# Patient Record
Sex: Female | Born: 1950 | Race: White | Hispanic: No | Smoking: Never smoker
Health system: Southern US, Community
[De-identification: ages and names within clinical notes are randomized; demographics above are authoritative.]

## PROBLEM LIST (undated history)

## (undated) ENCOUNTER — Inpatient Hospital Stay: Admission: EM | Payer: Self-pay | Source: Home / Self Care

## (undated) DIAGNOSIS — E669 Obesity, unspecified: Secondary | ICD-10-CM

## (undated) DIAGNOSIS — E785 Hyperlipidemia, unspecified: Secondary | ICD-10-CM

## (undated) DIAGNOSIS — E1169 Type 2 diabetes mellitus with other specified complication: Secondary | ICD-10-CM

## (undated) DIAGNOSIS — I213 ST elevation (STEMI) myocardial infarction of unspecified site: Secondary | ICD-10-CM

## (undated) DIAGNOSIS — I10 Essential (primary) hypertension: Secondary | ICD-10-CM

## (undated) DIAGNOSIS — D126 Benign neoplasm of colon, unspecified: Secondary | ICD-10-CM

## (undated) DIAGNOSIS — I2510 Atherosclerotic heart disease of native coronary artery without angina pectoris: Secondary | ICD-10-CM

## (undated) DIAGNOSIS — K219 Gastro-esophageal reflux disease without esophagitis: Secondary | ICD-10-CM

## (undated) DIAGNOSIS — Z9861 Coronary angioplasty status: Secondary | ICD-10-CM

## (undated) DIAGNOSIS — E559 Vitamin D deficiency, unspecified: Secondary | ICD-10-CM

## (undated) DIAGNOSIS — E1159 Type 2 diabetes mellitus with other circulatory complications: Secondary | ICD-10-CM

## (undated) HISTORY — DX: Atherosclerotic heart disease of native coronary artery without angina pectoris: I25.10

## (undated) HISTORY — DX: Type 2 diabetes mellitus with other specified complication: E78.5

## (undated) HISTORY — PX: ABDOMINAL HYSTERECTOMY: SHX81

## (undated) HISTORY — DX: Vitamin D deficiency, unspecified: E55.9

## (undated) HISTORY — DX: Coronary angioplasty status: Z98.61

## (undated) HISTORY — DX: Benign neoplasm of colon, unspecified: D12.6

## (undated) HISTORY — PX: NM MYOVIEW LTD: HXRAD82

## (undated) HISTORY — DX: Type 2 diabetes mellitus with other specified complication: E11.69

## (undated) HISTORY — DX: Essential (primary) hypertension: I10

---
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
## 1994-02-07 HISTORY — PX: LAPAROSCOPIC HYSTERECTOMY: SHX1926

## 1997-08-04 ENCOUNTER — Other Ambulatory Visit: Admission: RE | Admit: 1997-08-04 | Discharge: 1997-08-04 | Payer: Self-pay | Admitting: Obstetrics and Gynecology

## 2000-06-19 ENCOUNTER — Other Ambulatory Visit: Admission: RE | Admit: 2000-06-19 | Discharge: 2000-06-19 | Payer: Self-pay | Admitting: Obstetrics and Gynecology

## 2003-06-13 ENCOUNTER — Encounter: Admission: RE | Admit: 2003-06-13 | Discharge: 2003-06-17 | Payer: Self-pay | Admitting: Family Medicine

## 2003-06-20 ENCOUNTER — Encounter: Admission: RE | Admit: 2003-06-20 | Discharge: 2003-07-11 | Payer: Self-pay | Admitting: Family Medicine

## 2003-12-30 ENCOUNTER — Encounter: Admission: RE | Admit: 2003-12-30 | Discharge: 2003-12-30 | Payer: Self-pay | Admitting: Family Medicine

## 2004-02-08 HISTORY — PX: LAPAROSCOPIC GASTRIC BANDING: SHX1100

## 2004-05-18 ENCOUNTER — Encounter: Admission: RE | Admit: 2004-05-18 | Discharge: 2004-08-16 | Payer: Self-pay | Admitting: Family Medicine

## 2004-05-27 ENCOUNTER — Ambulatory Visit (HOSPITAL_COMMUNITY): Admission: RE | Admit: 2004-05-27 | Discharge: 2004-05-27 | Payer: Self-pay | Admitting: Surgery

## 2004-08-17 ENCOUNTER — Encounter: Admission: RE | Admit: 2004-08-17 | Discharge: 2004-11-15 | Payer: Self-pay | Admitting: Family Medicine

## 2004-08-31 ENCOUNTER — Inpatient Hospital Stay (HOSPITAL_COMMUNITY): Admission: RE | Admit: 2004-08-31 | Discharge: 2004-09-06 | Payer: Self-pay | Admitting: Surgery

## 2005-01-03 ENCOUNTER — Encounter: Admission: RE | Admit: 2005-01-03 | Discharge: 2005-01-03 | Payer: Self-pay | Admitting: Family Medicine

## 2005-02-25 ENCOUNTER — Encounter: Admission: RE | Admit: 2005-02-25 | Discharge: 2005-05-26 | Payer: Self-pay | Admitting: Family Medicine

## 2005-06-30 ENCOUNTER — Encounter: Admission: RE | Admit: 2005-06-30 | Discharge: 2005-06-30 | Payer: Self-pay | Admitting: Family Medicine

## 2005-08-30 ENCOUNTER — Ambulatory Visit: Payer: Self-pay | Admitting: Gastroenterology

## 2005-09-07 DIAGNOSIS — D126 Benign neoplasm of colon, unspecified: Secondary | ICD-10-CM

## 2005-09-07 HISTORY — DX: Benign neoplasm of colon, unspecified: D12.6

## 2005-10-06 ENCOUNTER — Ambulatory Visit: Payer: Self-pay | Admitting: Gastroenterology

## 2010-09-24 ENCOUNTER — Encounter: Payer: Self-pay | Admitting: Gastroenterology

## 2010-10-07 ENCOUNTER — Encounter: Payer: Self-pay | Admitting: Gastroenterology

## 2010-11-11 ENCOUNTER — Ambulatory Visit (AMBULATORY_SURGERY_CENTER): Payer: 59 | Admitting: *Deleted

## 2010-11-11 ENCOUNTER — Encounter: Payer: Self-pay | Admitting: Gastroenterology

## 2010-11-11 VITALS — Ht 61.0 in | Wt 235.0 lb

## 2010-11-11 DIAGNOSIS — Z1211 Encounter for screening for malignant neoplasm of colon: Secondary | ICD-10-CM

## 2010-11-11 MED ORDER — PEG-KCL-NACL-NASULF-NA ASC-C 100 G PO SOLR: ORAL | Status: DC

## 2010-11-26 ENCOUNTER — Encounter: Payer: Self-pay | Admitting: Gastroenterology

## 2010-11-26 ENCOUNTER — Ambulatory Visit (AMBULATORY_SURGERY_CENTER): Payer: 59 | Admitting: Gastroenterology

## 2010-11-26 VITALS — BP 184/111 | HR 91 | Temp 98.3°F | Resp 21 | Ht 61.0 in | Wt 235.0 lb

## 2010-11-26 DIAGNOSIS — Z1211 Encounter for screening for malignant neoplasm of colon: Secondary | ICD-10-CM

## 2010-11-26 DIAGNOSIS — Z86010 Personal history of colonic polyps: Secondary | ICD-10-CM

## 2010-11-26 MED ORDER — SODIUM CHLORIDE 0.9 % IV SOLN: 500.0000 mL | INTRAVENOUS | Status: DC

## 2010-11-26 NOTE — Progress Notes (Signed)
Pt was uncomfortable with the scope being advanced.  Medications were titrated per Dr. Ardell Isaacs orders.  Pt did relax and went to sleep while the scope was withdrawn from the cecum.  Pt rested comfortably with her eyes closed.  maw

## 2010-11-26 NOTE — Patient Instructions (Signed)
Normal colon with internal hemorrhoids  Repeat colonoscopy in 5 years.  See green and blue sheets for additional d/c instructions.

## 2010-11-29 ENCOUNTER — Telehealth: Payer: Self-pay | Admitting: *Deleted

## 2010-11-29 NOTE — Telephone Encounter (Signed)

## 2011-12-29 ENCOUNTER — Encounter (INDEPENDENT_AMBULATORY_CARE_PROVIDER_SITE_OTHER): Payer: Self-pay | Admitting: General Surgery

## 2011-12-30 ENCOUNTER — Encounter (INDEPENDENT_AMBULATORY_CARE_PROVIDER_SITE_OTHER): Payer: Self-pay | Admitting: Surgery

## 2011-12-30 ENCOUNTER — Ambulatory Visit (INDEPENDENT_AMBULATORY_CARE_PROVIDER_SITE_OTHER): Payer: 59 | Admitting: Surgery

## 2011-12-30 VITALS — BP 194/110 | HR 80 | Temp 98.8°F | Resp 16 | Ht 62.0 in | Wt 236.4 lb

## 2011-12-30 DIAGNOSIS — Z9884 Bariatric surgery status: Secondary | ICD-10-CM

## 2011-12-30 NOTE — Progress Notes (Signed)
Sandra Beasley 61 y.o.  Body mass index is 43.24 kg/(m^2).  There is no problem list on file for this patient.   No Known Allergies  Past Surgical History  Procedure Date  . Tonsillectomy 1966  . Laparoscopic hysterectomy 1996  . Laparoscopic gastric banding 2006   Sandra Floro, MD No diagnosis found.  I have not seen the solution in a number of years. She is keeping her weight off is still around 236 which gives her about a 70 pound weight loss. She states that when she drinks one few times a year she will have burning pain throughout her chest. She thinks this may be from taking metformin lighted night and going to bed. She may need liquid metformin. She was worried about reflux and one to have endoscopy described spoken with Dr. Ezzard Standing about performing an upper endoscopy on her. I can see her back after the endoscopy and we need to get her on liquid metformin would likely speak with Dr. Duane Lope. Sandra B. Daphine Deutscher, MD, [facility] [phone] beeper [phone]  12/30/2011 4:50 PM

## 2012-01-17 ENCOUNTER — Encounter (HOSPITAL_COMMUNITY): Payer: Self-pay

## 2012-01-17 ENCOUNTER — Ambulatory Visit (HOSPITAL_COMMUNITY): Admit: 2012-01-17 | Payer: Self-pay | Admitting: Surgery

## 2012-01-17 SURGERY — EGD (ESOPHAGOGASTRODUODENOSCOPY)
Anesthesia: Moderate Sedation

## 2012-01-24 ENCOUNTER — Ambulatory Visit (HOSPITAL_COMMUNITY): Admission: RE | Admit: 2012-01-24 | Payer: 59 | Source: Ambulatory Visit | Admitting: Surgery

## 2012-01-24 ENCOUNTER — Encounter (HOSPITAL_COMMUNITY): Admission: RE | Payer: Self-pay | Source: Ambulatory Visit

## 2012-01-24 SURGERY — EGD (ESOPHAGOGASTRODUODENOSCOPY)
Anesthesia: Moderate Sedation

## 2012-02-17 ENCOUNTER — Ambulatory Visit (INDEPENDENT_AMBULATORY_CARE_PROVIDER_SITE_OTHER): Payer: 59 | Admitting: Surgery

## 2013-01-02 ENCOUNTER — Encounter (HOSPITAL_COMMUNITY): Payer: Self-pay | Admitting: Cardiology

## 2013-01-02 ENCOUNTER — Inpatient Hospital Stay (HOSPITAL_COMMUNITY)
Admission: EM | Admit: 2013-01-02 | Discharge: 2013-01-05 | DRG: 247 | Disposition: A | Discharge status: Home / Self Care | Payer: 59 | Attending: Cardiology | Admitting: Cardiology

## 2013-01-02 ENCOUNTER — Encounter (HOSPITAL_COMMUNITY)
Admission: EM | Disposition: A | Discharge status: Home / Self Care | Payer: Self-pay | Source: Home / Self Care | Attending: Cardiology

## 2013-01-02 ENCOUNTER — Ambulatory Visit (HOSPITAL_COMMUNITY): Admit: 2013-01-02 | Payer: Self-pay | Admitting: Cardiology

## 2013-01-02 DIAGNOSIS — E785 Hyperlipidemia, unspecified: Secondary | ICD-10-CM

## 2013-01-02 DIAGNOSIS — I10 Essential (primary) hypertension: Secondary | ICD-10-CM

## 2013-01-02 DIAGNOSIS — Z9884 Bariatric surgery status: Secondary | ICD-10-CM

## 2013-01-02 DIAGNOSIS — I2510 Atherosclerotic heart disease of native coronary artery without angina pectoris: Secondary | ICD-10-CM | POA: Diagnosis present

## 2013-01-02 DIAGNOSIS — E1159 Type 2 diabetes mellitus with other circulatory complications: Secondary | ICD-10-CM

## 2013-01-02 DIAGNOSIS — I219 Acute myocardial infarction, unspecified: Secondary | ICD-10-CM

## 2013-01-02 DIAGNOSIS — E119 Type 2 diabetes mellitus without complications: Secondary | ICD-10-CM | POA: Diagnosis present

## 2013-01-02 DIAGNOSIS — I25118 Atherosclerotic heart disease of native coronary artery with other forms of angina pectoris: Secondary | ICD-10-CM | POA: Insufficient documentation

## 2013-01-02 DIAGNOSIS — Z6841 Body Mass Index (BMI) 40.0 and over, adult: Secondary | ICD-10-CM

## 2013-01-02 DIAGNOSIS — I2119 ST elevation (STEMI) myocardial infarction involving other coronary artery of inferior wall: Secondary | ICD-10-CM

## 2013-01-02 DIAGNOSIS — I213 ST elevation (STEMI) myocardial infarction of unspecified site: Secondary | ICD-10-CM

## 2013-01-02 DIAGNOSIS — E669 Obesity, unspecified: Secondary | ICD-10-CM

## 2013-01-02 HISTORY — PX: CARDIAC CATHETERIZATION: SHX172

## 2013-01-02 HISTORY — PX: LEFT HEART CATHETERIZATION WITH CORONARY ANGIOGRAM: SHX5451

## 2013-01-02 HISTORY — DX: Gastro-esophageal reflux disease without esophagitis: K21.9

## 2013-01-02 HISTORY — DX: Type 2 diabetes mellitus with other circulatory complications: E11.59

## 2013-01-02 HISTORY — DX: ST elevation (STEMI) myocardial infarction of unspecified site: I21.3

## 2013-01-02 HISTORY — DX: Obesity, unspecified: E66.9

## 2013-01-02 HISTORY — PX: CORONARY ANGIOPLASTY WITH STENT PLACEMENT: SHX49

## 2013-01-02 LAB — CBC
Hemoglobin: 12.9 g/dL (ref 12.0–15.0)
MCH: 28.5 pg (ref 26.0–34.0)
MCV: 84.7 fL (ref 78.0–100.0)
Platelets: 203 10*3/uL (ref 150–400)
RBC: 4.52 MIL/uL (ref 3.87–5.11)

## 2013-01-02 LAB — LIPID PANEL
Cholesterol: 227 mg/dL — ABNORMAL HIGH (ref 0–200)
LDL Cholesterol: 145 mg/dL — ABNORMAL HIGH (ref 0–99)
Total CHOL/HDL Ratio: 4 RATIO
Triglycerides: 123 mg/dL (ref <150)
VLDL: 25 mg/dL (ref 0–40)

## 2013-01-02 LAB — TROPONIN I
Troponin I: 0.4 ng/mL (ref <0.30)
Troponin I: 0.55 ng/mL (ref <0.30)

## 2013-01-02 LAB — APTT: aPTT: 97 seconds — ABNORMAL HIGH (ref 24–37)

## 2013-01-02 LAB — COMPREHENSIVE METABOLIC PANEL
ALT: 8 U/L (ref 0–35)
AST: 7 U/L (ref 0–37)
Alkaline Phosphatase: 68 U/L (ref 39–117)
CO2: 23 mEq/L (ref 19–32)
Calcium: 8.6 mg/dL (ref 8.4–10.5)
Creatinine, Ser: 0.73 mg/dL (ref 0.50–1.10)
GFR calc Af Amer: >90 mL/min (ref >90)
GFR calc non Af Amer: 88 mL/min — ABNORMAL LOW (ref >90)
Glucose, Bld: 227 mg/dL — ABNORMAL HIGH (ref 70–99)
Potassium: 3.7 mEq/L (ref 3.5–5.1)
Sodium: 133 mEq/L — ABNORMAL LOW (ref 135–145)
Total Protein: 6 g/dL (ref 6.0–8.3)

## 2013-01-02 LAB — GLUCOSE, CAPILLARY: Glucose-Capillary: 212 mg/dL — ABNORMAL HIGH (ref 70–99)

## 2013-01-02 LAB — POCT I-STAT, CHEM 8
HCT: 40 % (ref 36.0–46.0)
Hemoglobin: 13.6 g/dL (ref 12.0–15.0)
Potassium: 3.7 mEq/L (ref 3.5–5.1)
Sodium: 136 mEq/L (ref 135–145)
TCO2: 23 mmol/L (ref 0–100)

## 2013-01-02 LAB — CK TOTAL AND CKMB (NOT AT ARMC)
Relative Index: INVALID (ref 0.0–2.5)
Total CK: 31 U/L (ref 7–177)

## 2013-01-02 SURGERY — LEFT HEART CATHETERIZATION WITH CORONARY ANGIOGRAM
Anesthesia: LOCAL

## 2013-01-02 MED ORDER — ASPIRIN 81 MG PO CHEW
81.0000 mg | CHEWABLE_TABLET | Freq: Every day | ORAL | Status: DC
  Administered 2013-01-03 – 2013-01-05 (×3): 81 mg via ORAL
  Filled 2013-01-02 (×3): qty 1

## 2013-01-02 MED ORDER — HYDRALAZINE HCL 20 MG/ML IJ SOLN
20.0000 mg | INTRAMUSCULAR | Status: DC | PRN
  Administered 2013-01-02: 20 mg via INTRAVENOUS
  Filled 2013-01-02: qty 1

## 2013-01-02 MED ORDER — MIDAZOLAM HCL 2 MG/2ML IJ SOLN
INTRAMUSCULAR | Status: AC
  Filled 2013-01-02: qty 2

## 2013-01-02 MED ORDER — INSULIN ASPART 100 UNIT/ML ~~LOC~~ SOLN
0.0000 [IU] | Freq: Every day | SUBCUTANEOUS | Status: DC
  Administered 2013-01-02: 2 [IU] via SUBCUTANEOUS
  Filled 2013-01-02: qty 0.05

## 2013-01-02 MED ORDER — SODIUM CHLORIDE 0.9 % IV SOLN
1.0000 mL/kg/h | INTRAVENOUS | Status: AC
  Administered 2013-01-02: 1 mL/kg/h via INTRAVENOUS

## 2013-01-02 MED ORDER — TICAGRELOR 90 MG PO TABS
ORAL_TABLET | ORAL | Status: AC
  Filled 2013-01-02: qty 1

## 2013-01-02 MED ORDER — TICAGRELOR 90 MG PO TABS
90.0000 mg | ORAL_TABLET | Freq: Two times a day (BID) | ORAL | Status: DC
  Administered 2013-01-02 – 2013-01-05 (×6): 90 mg via ORAL
  Filled 2013-01-02 (×8): qty 1

## 2013-01-02 MED ORDER — ACETAMINOPHEN 325 MG PO TABS
650.0000 mg | ORAL_TABLET | ORAL | Status: DC | PRN
  Administered 2013-01-02 – 2013-01-03 (×2): 650 mg via ORAL
  Filled 2013-01-02 (×2): qty 2

## 2013-01-02 MED ORDER — FENTANYL CITRATE 0.05 MG/ML IJ SOLN
INTRAMUSCULAR | Status: AC
  Filled 2013-01-02: qty 2

## 2013-01-02 MED ORDER — SODIUM CHLORIDE 0.9 % IJ SOLN
3.0000 mL | Freq: Two times a day (BID) | INTRAMUSCULAR | Status: DC
  Administered 2013-01-03 – 2013-01-04 (×4): 3 mL via INTRAVENOUS

## 2013-01-02 MED ORDER — CARVEDILOL 6.25 MG PO TABS
6.2500 mg | ORAL_TABLET | Freq: Two times a day (BID) | ORAL | Status: DC
  Administered 2013-01-03 – 2013-01-04 (×3): 6.25 mg via ORAL
  Filled 2013-01-02 (×6): qty 1

## 2013-01-02 MED ORDER — SODIUM CHLORIDE 0.9 % IV SOLN: 250.0000 mL | INTRAVENOUS | Status: DC | PRN

## 2013-01-02 MED ORDER — BIVALIRUDIN 250 MG IV SOLR
INTRAVENOUS | Status: AC
  Filled 2013-01-02: qty 250

## 2013-01-02 MED ORDER — SODIUM CHLORIDE 0.9 % IV SOLN
0.2500 mg/kg/h | INTRAVENOUS | Status: AC
  Filled 2013-01-02: qty 250

## 2013-01-02 MED ORDER — SODIUM CHLORIDE 0.9 % IV SOLN
0.2500 mg/kg/h | INTRAVENOUS | Status: DC
  Administered 2013-01-02: 0.25 mg/kg/h via INTRAVENOUS
  Filled 2013-01-02: qty 250

## 2013-01-02 MED ORDER — ATORVASTATIN CALCIUM 80 MG PO TABS
80.0000 mg | ORAL_TABLET | Freq: Every day | ORAL | Status: DC
  Administered 2013-01-03 – 2013-01-04 (×2): 80 mg via ORAL
  Filled 2013-01-02 (×3): qty 1

## 2013-01-02 MED ORDER — HEPARIN (PORCINE) IN NACL 2-0.9 UNIT/ML-% IJ SOLN
INTRAMUSCULAR | Status: AC
Start: 2013-01-02 — End: 2013-01-02
  Filled 2013-01-02: qty 1500

## 2013-01-02 MED ORDER — NITROGLYCERIN 0.2 MG/ML ON CALL CATH LAB
INTRAVENOUS | Status: AC
  Filled 2013-01-02: qty 1

## 2013-01-02 MED ORDER — ONDANSETRON HCL 4 MG/2ML IJ SOLN: 4.0000 mg | Freq: Four times a day (QID) | INTRAMUSCULAR | Status: DC | PRN

## 2013-01-02 MED ORDER — RAMIPRIL 5 MG PO CAPS
5.0000 mg | ORAL_CAPSULE | Freq: Every day | ORAL | Status: DC
  Administered 2013-01-03: 5 mg via ORAL
  Filled 2013-01-02 (×2): qty 1

## 2013-01-02 MED ORDER — MORPHINE SULFATE 2 MG/ML IJ SOLN: 2.0000 mg | INTRAMUSCULAR | Status: DC | PRN

## 2013-01-02 MED ORDER — VERAPAMIL HCL 2.5 MG/ML IV SOLN
INTRAVENOUS | Status: AC
  Filled 2013-01-02: qty 2

## 2013-01-02 MED ORDER — INSULIN ASPART 100 UNIT/ML ~~LOC~~ SOLN
0.0000 [IU] | Freq: Three times a day (TID) | SUBCUTANEOUS | Status: DC
  Administered 2013-01-03 (×2): 3 [IU] via SUBCUTANEOUS
  Administered 2013-01-03 – 2013-01-04 (×2): 2 [IU] via SUBCUTANEOUS
  Administered 2013-01-04: 3 [IU] via SUBCUTANEOUS
  Administered 2013-01-04 – 2013-01-05 (×2): 2 [IU] via SUBCUTANEOUS
  Filled 2013-01-02: qty 0.15

## 2013-01-02 MED ORDER — SODIUM CHLORIDE 0.9 % IJ SOLN: 3.0000 mL | INTRAMUSCULAR | Status: DC | PRN

## 2013-01-02 MED ORDER — CARVEDILOL 3.125 MG PO TABS
3.1250 mg | ORAL_TABLET | Freq: Two times a day (BID) | ORAL | Status: DC
  Filled 2013-01-02: qty 1

## 2013-01-02 MED ORDER — LIDOCAINE HCL (PF) 1 % IJ SOLN
INTRAMUSCULAR | Status: AC
  Filled 2013-01-02: qty 30

## 2013-01-02 NOTE — Progress Notes (Signed)
CRITICAL VALUE ALERT  Critical value received:  INR=6.89  Date of notification:  01/02/13  Time of notification:  1950  Critical value read back:yes  Nurse who received alert:  Montine Circle  MD notified (1st page):  Dr. Herbie Baltimore  Time of first page:  1953  MD notified (2nd page):  Time of second page:  Responding MD:  Dr. Herbie Baltimore  Time MD responded:  [phone]

## 2013-01-02 NOTE — Progress Notes (Signed)
Patient ID: Sandra Beasley MRN: [id number], DOB/AGE: October 06, 1950   Admit date: 01/02/2013   Primary Physician:  Duane Lope, MD Primary Cardiologist: Dr Herbie Baltimore ([place])  HPI: 62 y/o with no prior history of CAD, admitted via EMS from home with a STEMI. History is per old records. She apparently has a history of NIDDM, HTN, dyslipidemia, and obesity. She has had prio lap banding. She developed SSCP today around 3:30 pm. She was alone at home. She didn't call EMS till she became diaphoretic around 5 pm. EKG on the sceen showed ST elevation in 2/3/F. She was taken emergently to the cath lab by Dr Herbie Baltimore.   Problem List: Past Medical History  Diagnosis Date  . Type 2 diabetes mellitus with circulatory disorder   . Hypertension   . Hyperlipidemia   . Obesity   . STEMI (ST elevation myocardial infarction) 01/02/13    Past Surgical History  Procedure Laterality Date  . Tonsillectomy  1966  . Laparoscopic hysterectomy  1996  . Laparoscopic gastric banding  2006     Allergies: No Known Allergies   Home Medications Current Facility-Administered Medications  Medication Dose Route Frequency Provider Last Rate Last Dose  . bivalirudin (ANGIOMAX) 5 mg/mL in sodium chloride 0.9 % 50 mL infusion  0.25 mg/kg/hr Intravenous Continuous Marykay Lex, MD         Family History  Problem Relation Age of Onset  . Colon cancer Neg Hx   . Stomach cancer Neg Hx   . Cancer Maternal Aunt     breast     History   Social History  . Marital Status: Divorced    Spouse Name: N/A    Number of Children: N/A  . Years of Education: N/A   Occupational History  . Not on file.   Social History Main Topics  . Smoking status: Never Smoker   . Smokeless tobacco: Not on file  . Alcohol Use: No  . Drug Use: No  . Sexual Activity: Not on file   Other Topics Concern  . Not on file   Social History Narrative  . No narrative on file     Review of Systems: Unable to obtain due to pt  factors   Physical Exam: Height 5\' 1"  (1.549 m), weight 229 lb 4.5 oz (104 kg).  H&P pending as pt is on the cath table. She is awake, alert, in no acute distress. She is obese  Labs:  No results found for this or any previous visit (from the past 24 hour(s)).   Radiology/Studies: No results found.  EKG:NSR with ST elevation 2/3/F  ASSESSMENT AND PLAN:  Active Problems:   STEMI (ST elevation myocardial infarction)   Type 2 diabetes mellitus with circulatory disorder   Hypertension   Hyperlipidemia   Obesity   PLAN: Urgent cath.    Deland Pretty, PA-C 01/02/2013, 5:45 PM  I have seen & examined the patient in the cath lab upon arrival. ~2 mm Inf & Lat STE (II, III, aVf, V4-V6).   Plan Urgent cath.  Marykay Lex, MD

## 2013-01-02 NOTE — H&P (Signed)
  Patient ID: HUDSYN BARICH MRN: [id number], DOB/AGE: [birth year]   Admit date: 01/02/2013   Primary Physician:  Duane Lope, MD Primary Cardiologist: Dr Herbie Baltimore ([place])  HPI: 62 y/o with no prior history of CAD, admitted via EMS from home with a STEMI. History is per old records. She apparently has a history of NIDDM, HTN, dyslipidemia, and obesity. She has had prio lap banding. She developed SSCP today around 3:30 pm. She was alone at home. She didn't call EMS till she became diaphoretic around 5 pm. EKG on the sceen showed ST elevation in 2/3/F. She was taken emergently to the cath lab by Dr Herbie Baltimore.   Problem List: Past Medical History  Diagnosis Date  . Type 2 diabetes mellitus with circulatory disorder   . Hypertension   . Hyperlipidemia   . Obesity   . STEMI (ST elevation myocardial infarction) 01/02/13    Past Surgical History  Procedure Laterality Date  . Tonsillectomy  1966  . Laparoscopic hysterectomy  1996  . Laparoscopic gastric banding  2006     Allergies: No Known Allergies   Home Medications Current Facility-Administered Medications  Medication Dose Route Frequency Provider Last Rate Last Dose  . bivalirudin (ANGIOMAX) 5 mg/mL in sodium chloride 0.9 % 50 mL infusion  0.25 mg/kg/hr Intravenous Continuous Marykay Lex, MD         Family History  Problem Relation Age of Onset  . Colon cancer Neg Hx   . Stomach cancer Neg Hx   . Cancer Maternal Aunt     breast     History   Social History  . Marital Status: Divorced    Spouse Name: N/A    Number of Children: N/A  . Years of Education: N/A   Occupational History  . Not on file.   Social History Main Topics  . Smoking status: Never Smoker   . Smokeless tobacco: Not on file  . Alcohol Use: No  . Drug Use: No  . Sexual Activity: Not on file   Other Topics Concern  . Not on file   Social History Narrative  . No narrative on file     Review of Systems: Unable to obtain due to pt  factors   Physical Exam: Height 5\' 1"  (1.549 m), weight 229 lb 4.5 oz (104 kg).  H&P pending as pt is on the cath table. She is awake, alert, in no acute distress. She is obese  Labs:  No results found for this or any previous visit (from the past 24 hour(s)).   Radiology/Studies: No results found.  EKG:NSR with ST elevation 2/3/F  ASSESSMENT AND PLAN:  Active Problems:   STEMI (ST elevation myocardial infarction)   Type 2 diabetes mellitus with circulatory disorder   Hypertension   Hyperlipidemia   Obesity   PLAN: Urgent cath.    Deland Pretty, PA-C 01/02/2013, 5:45 PM  I have seen & examined the patient in the cath lab upon arrival. ~2 mm Inf & Lat STE (II, III, aVf, V4-V6).   Plan Urgent cath.  Marykay Lex, MD

## 2013-01-02 NOTE — Progress Notes (Signed)
Dr. Herbie Baltimore notified of patient's hypertension 190s/90s. No chest pain, no complains, HR in low 60s. Orders for PRN antihypertensives obtained. Sandra Beasley

## 2013-01-02 NOTE — CV Procedure (Signed)
CARDIAC CATHETERIZATION AND PERCUTANEOUS CORONARY INTERVENTION REPORT  NAME:  [name]   MRN: [id number] DOB:  [birth year]   ADMIT DATE: 01/02/2013 Procedure Date: 01/02/2013  INTERVENTIONAL CARDIOLOGIST: Marykay Lex, M.D., MS PRIMARY CARE PROVIDER:  Duane Lope, MD PRIMARY CARDIOLOGIST: Marykay Lex, MD, MS  PATIENT:  Sandra Beasley is a 62 y.o. female with no prior history of CAD, who does have type II DM on oral medications, hypertension, dyslipidemia and obesity. She developed substernal chest pain a roughly 1530 this afternoon. Initially she did not call EMS, but when her symptoms worsened and she became diaphoretic at roughly 5 PM she contacted EMS. Upon arrival, ECG showed roughly 2 mm elevations in 2, 3, aVF as well as V4 through V6. Code STEMI was called the patient was brought directly to the emergency room up to the cardiac catheterization lab for urgent catheterization. She did receive sublingual nitroglycerin which alleviated the pain from 8-9/10 down to about 2/10, but by the time she arrived her symptoms were worsening again. With these ECG changes in her symptoms with risk factors, after brief evaluation she agreed to proceed with invasive evaluation of minus PCI.  PRE-OPERATIVE DIAGNOSIS:    Inferolateral STEMI2  PROCEDURES PERFORMED:    LEFT HEART CATHETERIZATION WITH CORONARY ANGIOGRAPHY  PERCUTANEOUS CORONARY INTERVENTION ON 100% OCCLUDED DISTAL RPDA USING 2 OVERLAPPING XIENCE XPEDITION DES (2.25 mm x 15 mm & 2.25 mm x 8 mm prox overlap -> post-dilated to 2.46 mm proximally & 2.4 mm distally).  PROCEDURE:Consent:  Risks of procedure as well as the alternatives and risks of each were explained to the patient. Verbal consent was obtained, as written consent was unable to be obtained due to the emergency of the situation.  PROCEDURE: The patient was brought to the 2nd Floor [facility] in the fasting state and prepped and draped in  the usual sterile fashion for Right groin or radial access. A modified Allen's test with plethysmography was performed, revealing excellent Ulnar artery collateral flow.  Sterile technique was used including antiseptics, cap, gloves, gown, hand hygiene, mask and sheet.  Skin prep: Chlorhexidine.  Time Out: Verified patient identification, verified procedure, site/side was marked, verified correct patient position, special equipment/implants available, medications/allergies/relevent history reviewed, required imaging and test results available.  Performed  Access: Right Radial Artery; 6 Fr Sheath -- Seldinger technique (Angiocath Micropuncture Kit)  10 mL IARadial Cocktail, IV Angiomax bolus Diagnostic:  5 [place] TIG 4.0, 6 [place] JR 4 guide; post PCI: 5 [place] JL 3.5,  Angled Pigtail  Left And Right Coronary Artery Angiography: TIG 4.0  Left Coronary Artery Angiography: JL 3.5 post PCI  LV Hemodynamics (LV Gram): Angled pigtail, post PCI  MEDICATIONS:  Anesthesia:  Local Lidocaine 2 ml  Sedation:  2 mg IV Versed, 50 mcg IV fentanyl ;   Omnipaque Contrast: 250 ml  Anticoagulation:  Angiomax Bolus & drip  Anti-Platelet Agent:  Brilinta 180 mg Radial Cocktail: 5 mg Verapamil, 400 mcg NTG, 2 ml 2% Lidocaine in 10 ml NS Intracoronary Nitroglycerin 200 mcg x1  Hemodynamics:  Central Aortic / Mean Pressures: 164/84 mmHg; 117 mmHg  Left Ventricular Pressures / EDP: 158/10 mmHg; 22 mmHg  Left Ventriculography:  EF: Roughly 55 %  Wall Motion: Mild mid to distal inferior hypokinesis  Coronary Anatomy:  Left Main: Large-caliber, short vessel that trifurcates into the LAD, Ramus Intermedius, and Circumflex; angiographically normal. LAD: This vessel begins as a relatively large-caliber vessel with a proximal 20% stenosis. There  is a somewhat proximal large-caliber first diagonal branch that is actually larger in diameter than the follow on LAD. Beyond that branch the LAD gives off a  moderate caliber septal perforator trunk and then becomes a diminutive vessel that tapers down not all at the apex. Just after the septal perforator there is a short segment of eccentric 80% stenosis. This is a 2.0-25 mm vessel at best.  D1: Large-caliber vessel with an ostial 40% stenosis. This branches and gives flow to the majority of the anterolateral wall.  Left Circumflex: Large-caliber vessel that has a small AV groove circumflex next branch mostly gives off a large lateral OM.  OM1:  Lateral OM that courses all along the inferolateral wall almost to the apex. There is a proximal 70-80% stenosis, then normalizes into a angiographically normal but tortuous vessel.  Ramus intermedius:  large-caliber vessel, at least as big as the LAD and circumflex. It gives 2 branches in the mid vessel. Mostly courses as a high OM1. Mild luminal irregularities.   RCA:  Large-Caliber dominant vessel with an anterior takeoff (very difficult to engage coaxially). There is mild luminal irregularities in the mid vessel roughly 20-30%. It then bifurcates into the Right Posterior Descending Artery (RPDA) and theRight Posterior AV Groove Branch (RPAV).   RPDA:  large-caliber, dominant vessel that reaches all the way to the apex. There is a hazy a 100% filling defect roughly halfway down the vessel consistent with thrombus.  Following the initial Angiomax bolus, angiography with the guide catheter revealed almost TIMI 3 flow distally down to very extensive PDA. It was residual 90% focal stenosis in a tapering fashion.   RPL Sysytem:The RPAV  starts off as a moderate caliber vessel and gives rise to several small posterior lateral branches there may have been a small branch that could have an occluded but not visualized.  Review of the initial angiography revealed the clear culprit lesion for the STEMI to be the mid-distal RPDA with significant lesions in the lateral OM and small LAD. She was hemodynamically stable.  The decision was made to proceed with PCI on the obvious culprit lesion. The plan would be to readdress the remaining lesions in the later date.  Percutaneous Coronary Intervention:  Brilinta 180 mg was administered by mouth. Guide: 6 Fr    JR 4 -- initially appeared to be very deep to engage, however this proved to be difficult to obtain and maintain coaxial vessel engagement. Guidewire:  BMW. Initial it tested as the wire lead to the guide catheter being pushed back due to lack of support. Therefore the predilation balloon was advanced over the wire and used to support the wire to advance down distally into the PDA. Prior to having the wire down, there was TIMI-3 flow into the culprit vessel. Difficulty with guide seating and wire advancement lead to a delay in initial balloon activation. However as there was reperfusion without the balloon, there was less urgency to deployed the initial device.  Predilation Balloon:  Emerge 2.0 mm x  12 mm;    8 Atm x  30 Sec  Stent #1 :  Xience XPEDITION 2.25 mm x  15 mm; this LAD stent was very difficult to advance into the distal RCA. There is significant loss of guide support. Retrospectively, the 15 mm stent not covering the entire lesion, albeit regrettable was probably the longest stent that would've been able to advance that for distally given the guide support.   12 Atm x  30 Sec,  14 Atm x  30 Sec -- final distal diameter 2.4 mm  200 mcg IC Nitroglycerin   Initial post-stent deployment angiography revealed that although it initially appeared to cover the entire segment, there is proximal plaque shifting of the original lesion. This led to a irregularity at the proximal edge of the stent. The decision was made to proceed with any second stent at the proximal edge.  Stent #21 :  Xience XPEDITION 2.25 mm x  8 mm;   Deployed at 14 Atm x  45  Sec, final proximal diameter 2.4 millimeter The balloon was then advanced to the overlapping segment and used for  post dilation of the overlap.  18 Atm x 45  Sec; Final Diameter = 2.46 mm   Post deployment angiography in multiple views, with and without guidewire in place revealed excellent stent deployment and lesion coverage.  There was no evidence of dissection or perforation.  After completing the PCI, the guide catheter was exchanged for the JL 3.5 catheter followed by the pigtail catheter for completion of left coronary artery angiography and left ventriculography. Catheters then removed with the out of body over wire.  TR Band:  1855 Hours, 16 mL air; nonocclusive hemostasis  PATIENT DISPOSITION:    The patient was transferred to the CCU holding area in a hemodynamicaly stable, chest pain free condition.  The patient tolerated the procedure well, and there were no complications.  EBL:   <  10  ml  The patient was stable before, during, and after the procedure.  POST-OPERATIVE DIAGNOSIS:     Infero-Lateral STEMI with a culprit lesion being an occluded mid-distal RPDA successfully treated with 2 overlapping Xience Xpedition DES stents.  Residual significant lesions noted in the lateral OM and mid LAD (the LAD itself is relatively diminutive in comparison to the remainder the vessels.)  Well-preserved LV EF with mildly elevated EDP.  PLAN OF CARE:  Admit to CCU overnight for post radial cath care. We'll run Angiomax for 4 hours at reduced rate.  Dual antiplatelet therapy for a minimum of 1 year. She was initiated on Brilinta. Care management consultation for medication assistance.  I will ask my colleagues to review the angiography in order to assess the residual lesions in the LAD and circumflex. The question would be other not to proceed with staged PCI of these vessels prior to discharge.  Add statin and beta blocker. Hold ACE inhibitor until the morning. May need when necessary antihypertensive medications overnight.  Hold metformin until a decision is made of what to do with the  other lesions. Use sliding scale insulin.  Cardiac Rehabilitation Phase I and 2 consultation.     Marykay Lex, M.D., M.S. [facility] [address] [place], [place]  [address]  [phone]  01/02/2013 9:34 PM

## 2013-01-02 NOTE — Progress Notes (Signed)
Chaplain paged to ED for code Stemi.  Waited with daughter and son in Cath Lab waiting area.  Offered emotional and prayer support to nervous family.     01/02/13 1925  Clinical Encounter Type  Visited With Family  Visit Type ED  Referral From Nurse  Consult/Referral To Chaplain  Spiritual Encounters  Spiritual Needs Emotional;Prayer  Stress Factors  Patient Stress Factors None identified  Family Stress Factors Major life changes;Other (Comment) (Mom having Heart Attack)   Sherrie Sport, [address]

## 2013-01-03 DIAGNOSIS — I2119 ST elevation (STEMI) myocardial infarction involving other coronary artery of inferior wall: Principal | ICD-10-CM

## 2013-01-03 DIAGNOSIS — E1159 Type 2 diabetes mellitus with other circulatory complications: Secondary | ICD-10-CM

## 2013-01-03 DIAGNOSIS — I10 Essential (primary) hypertension: Secondary | ICD-10-CM

## 2013-01-03 DIAGNOSIS — E669 Obesity, unspecified: Secondary | ICD-10-CM

## 2013-01-03 DIAGNOSIS — E785 Hyperlipidemia, unspecified: Secondary | ICD-10-CM

## 2013-01-03 LAB — COMPREHENSIVE METABOLIC PANEL
ALT: 8 U/L (ref 0–35)
Alkaline Phosphatase: 64 U/L (ref 39–117)
BUN: 11 mg/dL (ref 6–23)
CO2: 23 mEq/L (ref 19–32)
Calcium: 8.8 mg/dL (ref 8.4–10.5)
GFR calc Af Amer: >90 mL/min (ref >90)
GFR calc non Af Amer: 89 mL/min — ABNORMAL LOW (ref >90)
Glucose, Bld: 191 mg/dL — ABNORMAL HIGH (ref 70–99)
Potassium: 3.3 mEq/L — ABNORMAL LOW (ref 3.5–5.1)
Sodium: 138 mEq/L (ref 135–145)

## 2013-01-03 LAB — LIPID PANEL
HDL: 62 mg/dL (ref >39)
LDL Cholesterol: 144 mg/dL — ABNORMAL HIGH (ref 0–99)
Triglycerides: 108 mg/dL (ref <150)
VLDL: 22 mg/dL (ref 0–40)

## 2013-01-03 LAB — CBC
HCT: 38.2 % (ref 36.0–46.0)
Hemoglobin: 12.9 g/dL (ref 12.0–15.0)
MCH: 28.7 pg (ref 26.0–34.0)
RBC: 4.5 MIL/uL (ref 3.87–5.11)

## 2013-01-03 LAB — HEMOGLOBIN A1C
Hgb A1c MFr Bld: 8.2 % — ABNORMAL HIGH (ref <5.7)
Mean Plasma Glucose: 189 mg/dL — ABNORMAL HIGH (ref <117)

## 2013-01-03 LAB — TROPONIN I: Troponin I: 2.68 ng/mL (ref <0.30)

## 2013-01-03 LAB — PROTIME-INR: INR: 1.06 (ref 0.00–1.49)

## 2013-01-03 MED ORDER — POTASSIUM CHLORIDE CRYS ER 20 MEQ PO TBCR
40.0000 meq | EXTENDED_RELEASE_TABLET | Freq: Once | ORAL | Status: AC
  Administered 2013-01-03: 40 meq via ORAL
  Filled 2013-01-03: qty 2

## 2013-01-03 MED ORDER — PANTOPRAZOLE SODIUM 40 MG PO TBEC
40.0000 mg | DELAYED_RELEASE_TABLET | Freq: Every day | ORAL | Status: DC
  Administered 2013-01-03 – 2013-01-04 (×2): 40 mg via ORAL
  Filled 2013-01-03 (×3): qty 1

## 2013-01-03 NOTE — Progress Notes (Signed)
Subjective: No CP or SOB  Objective: Vital signs in last 24 hours: Temp:  [97.5 F (36.4 C)-97.6 F (36.4 C)] 97.5 F (36.4 C) (11/27 0400) Pulse Rate:  [63-77] 70 (11/27 0700) Resp:  [12-23] 20 (11/27 0700) BP: (118-194)/(59-145) 159/74 mmHg (11/27 0700) SpO2:  [95 %-100 %] 95 % (11/27 0700) Weight:  [229 lb 4.5 oz (104 kg)-230 lb 9.6 oz (104.6 kg)] 230 lb 9.6 oz (104.6 kg) (11/26 2000) Last BM Date: 01/02/13  Intake/Output from previous day: 11/26 0701 - 11/27 0700 In: 740.8 [P.O.:100; I.V.:640.8] Out: 650 [Urine:650] Intake/Output this shift:    Medications Current Facility-Administered Medications  Medication Dose Route Frequency Provider Last Rate Last Dose  . 0.9 %  sodium chloride infusion  250 mL Intravenous PRN Marykay Lex, MD      . acetaminophen (TYLENOL) tablet 650 mg  650 mg Oral Q4H PRN Marykay Lex, MD   650 mg at 01/02/13 2309  . aspirin chewable tablet 81 mg  81 mg Oral Daily Marykay Lex, MD      . atorvastatin (LIPITOR) tablet 80 mg  80 mg Oral q1800 Marykay Lex, MD      . carvedilol (COREG) tablet 6.25 mg  6.25 mg Oral BID WC Marykay Lex, MD      . hydrALAZINE (APRESOLINE) injection 20 mg  20 mg Intravenous Q4H PRN Marykay Lex, MD   20 mg at 01/02/13 2005  . insulin aspart (novoLOG) injection 0-15 Units  0-15 Units Subcutaneous TID WC Luke K Kilroy, PA-C      . insulin aspart (novoLOG) injection 0-5 Units  0-5 Units Subcutaneous QHS Abelino Derrick, PA-C   2 Units at 01/02/13 2147  . morphine 2 MG/ML injection 2 mg  2 mg Intravenous Q1H PRN Marykay Lex, MD      . ondansetron [facility]) injection 4 mg  4 mg Intravenous Q6H PRN Marykay Lex, MD      . pantoprazole (PROTONIX) EC tablet 40 mg  40 mg Oral Daily Zigmund Gottron, MD      . ramipril (ALTACE) capsule 5 mg  5 mg Oral Daily Marykay Lex, MD      . sodium chloride 0.9 % injection 3 mL  3 mL Intravenous Q12H Marykay Lex, MD      . sodium chloride 0.9 %  injection 3 mL  3 mL Intravenous PRN Marykay Lex, MD      . Ticagrelor [facility]) tablet 90 mg  90 mg Oral BID Marykay Lex, MD   90 mg at 01/02/13 2143    PE: General appearance: alert, cooperative and no distress Lungs: clear to auscultation bilaterally Heart: regular rate and rhythm, S1, S2 normal, no murmur, click, rub or gallop Extremities: No LEE Pulses: 2+ and symmetric Skin: Warm and dry Neurologic: Grossly normal  Lab Results:   Recent Labs  01/02/13 1753 01/02/13 1800 01/03/13 0245  WBC  --  7.3 8.4  HGB 13.6 12.9 12.9  HCT 40.0 38.3 38.2  PLT  --  203 223   BMET  Recent Labs  01/02/13 1753 01/02/13 1800 01/03/13 0245  NA 136 133* 138  K 3.7 3.7 3.3*  CL 104 100 103  CO2  --  23 23  GLUCOSE 236* 227* 191*  BUN 12 12 11   CREATININE 0.70 0.73 0.71  CALCIUM  --  8.6 8.8   PT/INR  Recent Labs  01/02/13 1800 01/03/13 0245  LABPROT  56.7* 13.6  INR 6.89* 1.06   Cholesterol  Recent Labs  01/03/13 0245  CHOL 228*   Lipid Panel     Component Value Date/Time   CHOL 228* 01/03/2013 0245   TRIG 108 01/03/2013 0245   HDL 62 01/03/2013 0245   CHOLHDL 3.7 01/03/2013 0245   VLDL 22 01/03/2013 0245   LDLCALC 144* 01/03/2013 0245   Assessment/Plan  Active Problems:   Hypertension   Hyperlipidemia   Obesity   Type 2 diabetes mellitus with circulatory disorder   STEMI (ST elevation myocardial infarction)   ST elevation myocardial infarction (STEMI) of inferolateral wall, initial episode of care   Plan:  SP Infero-Lateral STEMI with a culprit lesion being an occluded mid-distal RPDA successfully treated with 2 overlapping Xience Xpedition DES stents. ASA, Brilinta. Residual significant lesions noted in the lateral OM and mid LAD (the LAD itself is relatively diminutive in comparison to the remainder the vessels.)  May need to stage another PCI.   Well preserved LVEF.  Troponin up to 2.68.  SCr WNL.  Replace K+.   LAst A1C 8.0.  Will arrange OP  nutrition consult.  INR 1.06 was 6.89 last night.  Lipitor, Coreg 6.25, Ramipril 5mg       LOS: 1 day    Euva Rundell 01/03/2013 7:41 AM

## 2013-01-03 NOTE — Progress Notes (Signed)
Pt. Seen and examined. Agree with the NP/PA-C note as written.  Feels much better today. Chest pressure has totally resolved. Will review films regarding possible additional PCI, but there is certainly room for medical therapy - A1c last was 8.  She has been intolerant to lipitor and zocor due to statins, may need to be on Crestor at discharge.  Ambulate with cardiac rehab today. Will order 2D echo, but expect EF to be preserved due to the distal nature of the lesion.  Chrystie Nose, MD, [facility] Attending Cardiologist [facility]

## 2013-01-03 NOTE — Progress Notes (Signed)
eLink Physician-Brief Progress Note Patient Name: Sandra Beasley DOB: Jun 08, 1950 MRN: [id number]  Date of Service  01/03/2013   HPI/Events of Note  Best practice   eICU Interventions  PPI for stress ulcer propy in the setting of elevated INR   Intervention Category Intermediate Interventions: Best-practice therapies (e.g. DVT, beta blocker, etc.)  Tracy Kinner 01/03/2013, 12:41 AM

## 2013-01-04 DIAGNOSIS — I517 Cardiomegaly: Secondary | ICD-10-CM

## 2013-01-04 HISTORY — PX: TRANSTHORACIC ECHOCARDIOGRAM: SHX275

## 2013-01-04 LAB — GLUCOSE, CAPILLARY
Glucose-Capillary: 151 mg/dL — ABNORMAL HIGH (ref 70–99)
Glucose-Capillary: 133 mg/dL — ABNORMAL HIGH (ref 70–99)
Glucose-Capillary: 157 mg/dL — ABNORMAL HIGH (ref 70–99)

## 2013-01-04 LAB — BASIC METABOLIC PANEL
BUN: 10 mg/dL (ref 6–23)
Calcium: 9.1 mg/dL (ref 8.4–10.5)
Chloride: 102 mEq/L (ref 96–112)
Creatinine, Ser: 0.9 mg/dL (ref 0.50–1.10)
GFR calc non Af Amer: 66 mL/min — ABNORMAL LOW (ref >90)
Glucose, Bld: 201 mg/dL — ABNORMAL HIGH (ref 70–99)

## 2013-01-04 MED ORDER — CARVEDILOL 6.25 MG PO TABS
9.3750 mg | ORAL_TABLET | Freq: Two times a day (BID) | ORAL | Status: DC
  Administered 2013-01-04 – 2013-01-05 (×2): 9.375 mg via ORAL
  Filled 2013-01-04 (×5): qty 1

## 2013-01-04 MED ORDER — IRBESARTAN 150 MG PO TABS
150.0000 mg | ORAL_TABLET | Freq: Every day | ORAL | Status: DC
  Administered 2013-01-04 – 2013-01-05 (×2): 150 mg via ORAL
  Filled 2013-01-04 (×2): qty 1

## 2013-01-04 MED ORDER — METFORMIN HCL 500 MG PO TABS
500.0000 mg | ORAL_TABLET | Freq: Two times a day (BID) | ORAL | Status: DC
  Administered 2013-01-04 – 2013-01-05 (×3): 500 mg via ORAL
  Filled 2013-01-04 (×6): qty 1

## 2013-01-04 NOTE — Progress Notes (Addendum)
Pt. Seen and examined. Agree with the NP/PA-C note as written.  No events overnight. Feels great today. No further shortness of breath. Plan for 2D echo today. I have reviewed cath films - there was a hazy mid-LCx lesion of about 50% stenosis, the vessel is small.  The LAD does have diffuse disease, but no clear targets. There are diabetic neovascularization changes. Will continue to uptitrate medical therapy - diabetes coordinator consult today. Restart metformin - may need SGLT or GLP-1 medication in addition. She reports history of ACE-I cough - change ramipril back to irbesartan. Ok to transfer to telemetry. Probably d/c home tomorrow.  Chrystie Nose, MD, [facility] Attending Cardiologist [facility]

## 2013-01-04 NOTE — Care Management Note (Signed)
    Page 1 of 1   01/04/2013     10:14:54 AM   CARE MANAGEMENT NOTE 01/04/2013  Patient:  Sandra Beasley, Sandra Beasley   Account Number:  [id number]  Date Initiated:  01/04/2013  Documentation initiated by:  Junius Creamer  Subjective/Objective Assessment:   adm w mi     Action/Plan:   lives alone, pcp dr Duane Lope   Anticipated DC Date:     Anticipated DC Plan:  HOME/SELF CARE      DC Planning Services  CM consult  Medication Assistance      Choice offered to / List presented to:             Status of service:   Medicare Important Message given?   (If response is "NO", the following Medicare IM given date fields will be blank) Date Medicare IM given:   Date Additional Medicare IM given:    Discharge Disposition:  HOME/SELF CARE  Per UR Regulation:  Reviewed for med. necessity/level of care/duration of stay  If discussed at Long Length of Stay Meetings, dates discussed:    Comments:  11/28 1013 debbie Marqui Formby rn,bsn pt has 30day free brilinta card and copay assist card.

## 2013-01-04 NOTE — Progress Notes (Signed)
Subjective: No complaints.  feeels "great".  Objective: Vital signs in last 24 hours: Temp:  [97.8 F (36.6 C)-98.8 F (37.1 C)] 98.8 F (37.1 C) (11/28 0200) Pulse Rate:  [67-78] 67 (11/27 1700) Resp:  [13-24] 18 (11/28 0700) BP: (102-154)/(40-94) 138/65 mmHg (11/28 0700) SpO2:  [93 %-97 %] 95 % (11/28 0700) Last BM Date: 01/03/13  Intake/Output from previous day: 11/27 0701 - 11/28 0700 In: 960 [P.O.:960] Out: 900 [Urine:900] Intake/Output this shift:    Medications Current Facility-Administered Medications  Medication Dose Route Frequency Provider Last Rate Last Dose  . 0.9 %  sodium chloride infusion  250 mL Intravenous PRN Marykay Lex, MD      . acetaminophen (TYLENOL) tablet 650 mg  650 mg Oral Q4H PRN Marykay Lex, MD   650 mg at 01/03/13 2225  . aspirin chewable tablet 81 mg  81 mg Oral Daily Marykay Lex, MD   81 mg at 01/03/13 1032  . atorvastatin (LIPITOR) tablet 80 mg  80 mg Oral q1800 Marykay Lex, MD   80 mg at 01/03/13 1701  . carvedilol (COREG) tablet 6.25 mg  6.25 mg Oral BID WC Marykay Lex, MD   6.25 mg at 01/04/13 0743  . hydrALAZINE (APRESOLINE) injection 20 mg  20 mg Intravenous Q4H PRN Marykay Lex, MD   20 mg at 01/02/13 2005  . insulin aspart (novoLOG) injection 0-15 Units  0-15 Units Subcutaneous TID WC Abelino Derrick, PA-C   2 Units at 01/03/13 1710  . insulin aspart (novoLOG) injection 0-5 Units  0-5 Units Subcutaneous QHS Eda Paschal [place], PA-C   2 Units at 01/02/13 2147  . morphine 2 MG/ML injection 2 mg  2 mg Intravenous Q1H PRN Marykay Lex, MD      . ondansetron [facility]) injection 4 mg  4 mg Intravenous Q6H PRN Marykay Lex, MD      . pantoprazole (PROTONIX) EC tablet 40 mg  40 mg Oral Daily Zigmund Gottron, MD   40 mg at 01/03/13 1032  . ramipril (ALTACE) capsule 5 mg  5 mg Oral Daily Marykay Lex, MD   5 mg at 01/03/13 1032  . sodium chloride 0.9 % injection 3 mL  3 mL Intravenous Q12H Marykay Lex, MD   3  mL at 01/03/13 2225  . sodium chloride 0.9 % injection 3 mL  3 mL Intravenous PRN Marykay Lex, MD      . Ticagrelor [facility]) tablet 90 mg  90 mg Oral BID Marykay Lex, MD   90 mg at 01/03/13 2225    PE: General appearance: alert, cooperative and no distress  Lungs: clear to auscultation bilaterally  Heart: regular rate and rhythm, S1, S2 normal, no murmur, click, rub or gallop  Extremities: No LEE  Pulses: 2+ and symmetric  Skin: Warm and dry  Neurologic: Grossly normal   Lab Results:   Recent Labs  01/02/13 1753 01/02/13 1800 01/03/13 0245  WBC  --  7.3 8.4  HGB 13.6 12.9 12.9  HCT 40.0 38.3 38.2  PLT  --  203 223   BMET  Recent Labs  01/02/13 1753 01/02/13 1800 01/03/13 0245  NA 136 133* 138  K 3.7 3.7 3.3*  CL 104 100 103  CO2  --  23 23  GLUCOSE 236* 227* 191*  BUN 12 12 11   CREATININE 0.70 0.73 0.71  CALCIUM  --  8.6 8.8   PT/INR  Recent Labs  01/02/13 1800 01/03/13 0245  LABPROT 56.7* 13.6  INR 6.89* 1.06   Cholesterol  Recent Labs  01/03/13 0245  CHOL 228*   Lipid Panel     Component Value Date/Time   CHOL 228* 01/03/2013 0245   TRIG 108 01/03/2013 0245   HDL 62 01/03/2013 0245   CHOLHDL 3.7 01/03/2013 0245   VLDL 22 01/03/2013 0245   LDLCALC 144* 01/03/2013 0245   Cardiac Panel (last 3 results)  Recent Labs  01/02/13 1800 01/02/13 2105 01/03/13 0245  CKTOTAL 31  --   --   CKMB 2.2  --   --   TROPONINI 0.40* 0.55* 2.68*  RELINDX RELATIVE INDEX IS INVALID  --   --     Assessment/Plan  Active Problems:   Hypertension   Hyperlipidemia   Obesity   Type 2 diabetes mellitus with circulatory disorder   STEMI (ST elevation myocardial infarction)   ST elevation myocardial infarction (STEMI) of inferolateral wall, initial episode of care  Plan:  SP Infero-Lateral STEMI with a culprit lesion being an occluded mid-distal RPDA successfully treated with 2 overlapping Xience Xpedition DES stents. ASA, Brilinta. Residual  significant lesions noted in the lateral OM and mid LAD (the LAD itself is relatively diminutive in comparison to the remainder the vessels.)   2D echo pending.  Lipitor, Coreg 6.25, Ramipril 5mg .  BMET pending.  She is doing very well.  Will increase coreg  For a little better BP control. Transfer to telemetry.  DC home in the morning.    LOS: 2 days    Laria Grimmett 01/04/2013 7:49 AM

## 2013-01-04 NOTE — Progress Notes (Signed)
Received to 2w30. No changes noted in assessment. Denies chest pain or shortness of breath. Right radial dressing dry/intact. VSS. Call bell near.Sandra Beasley

## 2013-01-04 NOTE — Progress Notes (Signed)
CARDIAC REHAB PHASE I   PRE:  Rate/Rhythm: 71 SR  BP:  Sitting: 123/62     SaO2: 96% ra  MODE:  Ambulation: 600 ft   POST:  Rate/Rhythm: 86 sr  BP:  Sitting: 151/77     SaO2: 97%  8:00AM-9:10AM Patient walked at a good stable pace.  Patient stated that she had a pain in the top of her left foot.  No complains of chest tightness or shortness of breath.  Patient stated that it felt good to get up. Patient placed in chair with call bell in reach.  Patient shows anxiety about being away from work.  States that she is interested in Cardiac Rehab but afraid her boss will not be flexible with the schedule.    Theresa Duty, [place] 01/04/2013 9:09 AM

## 2013-01-04 NOTE — Progress Notes (Signed)
  Echocardiogram 2D Echocardiogram has been performed.  Sandra Beasley 01/04/2013, 11:25 AM

## 2013-01-04 NOTE — Progress Notes (Signed)
Inpatient Diabetes Program Recommendations  AACE/ADA: New Consensus Statement on Inpatient Glycemic Control (2013)  Target Ranges:  Prepandial:   less than 140 mg/dL      Peak postprandial:   less than 180 mg/dL (1-2 hours)      Critically ill patients:  140 - 180 mg/dL   Reason for Visit: Spoke to patient regarding increased A1C=8.2%.  She states that this is high for her and that she usually ranges between 6-7%.  Her PCP is Dr. Tenny Craw.  She states that she knows that she has not been caring for herself properly and she has been under lots of stress with her job.  She states that she knows of 5 things she can change including exercise, eating right, etc. To improve her diabetes management.  Discussed other diabetes agents such as GLP-1 with patient also.  She states that she has discussed this with her PCP in the past but she declined due to the side effects.  Note referral for cardiac rehab.  Would benefit from follow-up with dietician in cardiac rehab also.  No further recs. At this time.  Needs to follow-up with Dr. Tenny Craw.  Beryl Meager, RN, BC-ADM Inpatient Diabetes Coordinator Pager [phone]

## 2013-01-05 DIAGNOSIS — I219 Acute myocardial infarction, unspecified: Secondary | ICD-10-CM

## 2013-01-05 LAB — BASIC METABOLIC PANEL
Calcium: 8.9 mg/dL (ref 8.4–10.5)
Chloride: 105 mEq/L (ref 96–112)
GFR calc Af Amer: 80 mL/min — ABNORMAL LOW (ref >90)
Potassium: 3.5 mEq/L (ref 3.5–5.1)
Sodium: 139 mEq/L (ref 135–145)

## 2013-01-05 LAB — GLUCOSE, CAPILLARY: Glucose-Capillary: 146 mg/dL — ABNORMAL HIGH (ref 70–99)

## 2013-01-05 MED ORDER — CARVEDILOL 6.25 MG PO TABS: 9.3750 mg | ORAL_TABLET | Freq: Two times a day (BID) | ORAL | Status: DC

## 2013-01-05 MED ORDER — ASPIRIN 81 MG PO CHEW: 81.0000 mg | CHEWABLE_TABLET | Freq: Every day | ORAL | Status: DC

## 2013-01-05 MED ORDER — TICAGRELOR 90 MG PO TABS: 90.0000 mg | ORAL_TABLET | Freq: Two times a day (BID) | ORAL | Status: DC

## 2013-01-05 MED ORDER — ROSUVASTATIN CALCIUM 40 MG PO TABS: 40.0000 mg | ORAL_TABLET | Freq: Every day | ORAL | Status: DC

## 2013-01-05 NOTE — Progress Notes (Signed)
Subjective: No compalints  Objective: Vital signs in last 24 hours: Temp:  [97.6 F (36.4 C)-98.6 F (37 C)] 98.2 F (36.8 C) (11/29 0451) Pulse Rate:  [63-65] 63 (11/29 0451) Resp:  [15-27] 18 (11/29 0451) BP: (115-161)/(54-80) 142/77 mmHg (11/29 0451) SpO2:  [96 %-100 %] 97 % (11/29 0451) Last BM Date: 01/04/13  Intake/Output from previous day: 11/28 0701 - 11/29 0700 In: 600 [P.O.:600] Out: 950 [Urine:950] Intake/Output this shift:    Medications Current Facility-Administered Medications  Medication Dose Route Frequency Provider Last Rate Last Dose  . 0.9 %  sodium chloride infusion  250 mL Intravenous PRN Marykay Lex, MD      . acetaminophen (TYLENOL) tablet 650 mg  650 mg Oral Q4H PRN Marykay Lex, MD   650 mg at 01/03/13 2225  . aspirin chewable tablet 81 mg  81 mg Oral Daily Marykay Lex, MD   81 mg at 01/04/13 1478  . atorvastatin (LIPITOR) tablet 80 mg  80 mg Oral q1800 Marykay Lex, MD   80 mg at 01/04/13 2159  . carvedilol (COREG) tablet 9.375 mg  9.375 mg Oral BID WC Wilburt Finlay, PA-C   9.375 mg at 01/04/13 1655  . hydrALAZINE (APRESOLINE) injection 20 mg  20 mg Intravenous Q4H PRN Marykay Lex, MD   20 mg at 01/02/13 2005  . insulin aspart (novoLOG) injection 0-15 Units  0-15 Units Subcutaneous TID WC Abelino Derrick, PA-C   2 Units at 01/05/13 [phone]  . insulin aspart (novoLOG) injection 0-5 Units  0-5 Units Subcutaneous QHS Eda Paschal [place], PA-C   2 Units at 01/02/13 2147  . irbesartan (AVAPRO) tablet 150 mg  150 mg Oral Daily Chrystie Nose, MD   150 mg at 01/04/13 0956  . metFORMIN (GLUCOPHAGE) tablet 500 mg  500 mg Oral BID WC Chrystie Nose, MD   500 mg at 01/04/13 1655  . morphine 2 MG/ML injection 2 mg  2 mg Intravenous Q1H PRN Marykay Lex, MD      . ondansetron [facility]) injection 4 mg  4 mg Intravenous Q6H PRN Marykay Lex, MD      . pantoprazole (PROTONIX) EC tablet 40 mg  40 mg Oral Daily Zigmund Gottron, MD   40 mg at  01/04/13 0956  . sodium chloride 0.9 % injection 3 mL  3 mL Intravenous Q12H Marykay Lex, MD   3 mL at 01/04/13 2200  . sodium chloride 0.9 % injection 3 mL  3 mL Intravenous PRN Marykay Lex, MD      . Ticagrelor [facility]) tablet 90 mg  90 mg Oral BID Marykay Lex, MD   90 mg at 01/04/13 2157    PE: General appearance: alert, cooperative and no distress  Lungs: clear to auscultation bilaterally  Heart: regular rate and rhythm, S1, S2 normal, no murmur, click, rub or gallop  Extremities: No LEE  Pulses: 2+ and symmetric  Skin: Warm and dry  Neurologic: Grossly normal   Lab Results:   Recent Labs  01/02/13 1753 01/02/13 1800 01/03/13 0245  WBC  --  7.3 8.4  HGB 13.6 12.9 12.9  HCT 40.0 38.3 38.2  PLT  --  203 223   BMET  Recent Labs  01/03/13 0245 01/04/13 0920 01/05/13 0615  NA 138 137 139  K 3.3* 4.2 3.5  CL 103 102 105  CO2 23 25 24   GLUCOSE 191* 201* 162*  BUN 11 10 11  CREATININE 0.71 0.90 0.87  CALCIUM 8.8 9.1 8.9   PT/INR  Recent Labs  01/02/13 1800 01/03/13 0245  LABPROT 56.7* 13.6  INR 6.89* 1.06   Cholesterol  Recent Labs  01/03/13 0245  CHOL 228*   Lipid Panel     Component Value Date/Time   CHOL 228* 01/03/2013 0245   TRIG 108 01/03/2013 0245   HDL 62 01/03/2013 0245   CHOLHDL 3.7 01/03/2013 0245   VLDL 22 01/03/2013 0245   LDLCALC 144* 01/03/2013 0245   - Left ventricle: The cavity size was normal. There was moderate concentric hypertrophy. Systolic function was normal. The estimated ejection fraction was in the range of 60% to 65%. Wall motion was normal; there were no regional wall motion abnormalities. Doppler parameters are consistent with abnormal left ventricular relaxation (grade 1 diastolic dysfunction). The E/e' ratio is <10, suggesting normal LV filling pressure. - Left atrium: The atrium was normal in size. - Inferior vena cava: The vessel was normal in size; the respirophasic diameter changes were in  the normal range (= 50%); findings are consistent with normal central venous pressure. - Pericardium, extracardiac: There was no pericardial effusion.   Assessment/Plan   Active Problems:   Hypertension   Hyperlipidemia   Obesity   Type 2 diabetes mellitus with circulatory disorder   STEMI (ST elevation myocardial infarction)   ST elevation myocardial infarction (STEMI) of inferolateral wall, initial episode of care  Plan:  SP Infero-Lateral STEMI with a culprit lesion being an occluded mid-distal RPDA successfully treated with 2 overlapping Xience Xpedition DES stents. ASA, Brilinta. Residual significant lesions noted in the lateral OM and mid LAD (the LAD itself is relatively diminutive in comparison to the remainder the vessels.)   2D echo revealed EF of 60-65%, No WMA, grade one diastolic dysfunction.  Will change from Lipitor to crestor as she had issues with it before. Coreg 9.375, Irbesartan 150mg , ASA, brilinta . he is doing very well.  OP dietary consult will be arranged.  DC home today.  Follow up in 7-10 days with me and then Dr. Herbie Baltimore.       LOS: 3 days    HAGER, BRYAN 01/05/2013 9:41 AM  I have seen and examined the patient along with Wilburt Finlay, PA.  I have reviewed the chart, notes and new data.  I agree with PA's note.  Key new complaints: feels great, her "indigestion" which had been ongoing for about a year, has resolved Key examination changes: small wrist ecchymosis at radial access site, clear lungs, no S3, JVD or rales Key new findings / data: echo reviewed  PLAN: DC home. Discussed critical need for uninterrupted dual antiplatelet therapy. Cardiac rehab. Early follow up   Thurmon Fair, MD, [facility] [phone] 01/05/2013, 10:08 AM

## 2013-01-05 NOTE — Progress Notes (Signed)
Nursing note 01/05/13 Patient ambulated in hallway with family. Will continue to monitor patient. Aws Shere, Randall An RN

## 2013-01-05 NOTE — Discharge Summary (Signed)
Physician Discharge Summary      Patient ID: Sandra Beasley MRN: [id number] DOB/AGE: [birth year] 62 y.o.  Cardiologist: Herbie Baltimore  Admit date: 01/02/2013 Discharge date: 01/05/2013  Admission Diagnoses:  STEMI  Discharge Diagnoses:  Principal Problem:   ST elevation myocardial infarction (STEMI) of inferolateral wall, initial episode of care Active Problems:   Hypertension   Hyperlipidemia   Obesity   Type 2 diabetes mellitus with circulatory disorder   STEMI (ST elevation myocardial infarction)   Discharged Condition: stable  Hospital Course:  62 y/o with no prior history of CAD, admitted via EMS from home with a STEMI. History is per old records. She apparently has a history of NIDDM, HTN, dyslipidemia, and obesity. She has had prio lap banding. She developed SSCP today around 3:30 pm. She was alone at home. She didn't call EMS till she became diaphoretic around 5 pm. EKG on the sceen showed ST elevation in 2/3/F.   She was taken to the cath lab which revealed a culprit lesion being an occluded mid-distal RPDA successfully treated with 2 overlapping Xience Xpedition DES stents.  She recovered very well.  ASA and brilinta were started.  A1C was 8.2.  She will be scheduled for OP dietary consult.  Follow up with Extender in 7-10 days.  2D echo revealed preserved EF with no WMA.  The patient was seen by Dr. Royann Shivers who felt she was stable for DC home.  Consults: Diabetes coor  Significant Diagnostic Studies:  2D echo Study Conclusions  - Left ventricle: The cavity size was normal. There was moderate concentric hypertrophy. Systolic function was normal. The estimated ejection fraction was in the range of 60% to 65%. Wall motion was normal; there were no regional wall motion abnormalities. Doppler parameters are consistent with abnormal left ventricular relaxation (grade 1 diastolic dysfunction). The E/e' ratio is <10, suggesting normal LV filling pressure. - Left atrium:  The atrium was normal in size. - Inferior vena cava: The vessel was normal in size; the respirophasic diameter changes were in the normal range (= 50%); findings are consistent with normal central venous pressure. - Pericardium, extracardiac: There was no pericardial effusion.  Hemodynamics:  Central Aortic / Mean Pressures: 164/84 mmHg; 117 mmHg  Left Ventricular Pressures / EDP: 158/10 mmHg; 22 mmHg Left Ventriculography:  EF: Roughly 55 %  Wall Motion: Mild mid to distal inferior hypokinesis Coronary Anatomy:  Left Main: Large-caliber, short vessel that trifurcates into the LAD, Ramus Intermedius, and Circumflex; angiographically normal. LAD: This vessel begins as a relatively large-caliber vessel with a proximal 20% stenosis. There is a somewhat proximal large-caliber first diagonal branch that is actually larger in diameter than the follow on LAD. Beyond that branch the LAD gives off a moderate caliber septal perforator trunk and then becomes a diminutive vessel that tapers down not all at the apex. Just after the septal perforator there is a short segment of eccentric 80% stenosis. This is a 2.0-25 mm vessel at best.  D1: Large-caliber vessel with an ostial 40% stenosis. This branches and gives flow to the majority of the anterolateral wall. Left Circumflex: Large-caliber vessel that has a small AV groove circumflex next branch mostly gives off a large lateral OM.  OM1: Lateral OM that courses all along the inferolateral wall almost to the apex. There is a proximal 70-80% stenosis, then normalizes into a angiographically normal but tortuous vessel. Ramus intermedius: large-caliber vessel, at least as big as the LAD and circumflex. It gives 2 branches in the mid  vessel. Mostly courses as a high OM1. Mild luminal irregularities.  RCA: Large-Caliber dominant vessel with an anterior takeoff (very difficult to engage coaxially). There is mild luminal irregularities in the mid vessel roughly  20-30%. It then bifurcates into the Right Posterior Descending Artery (RPDA) and theRight Posterior AV Groove Branch (RPAV).  RPDA: large-caliber, dominant vessel that reaches all the way to the apex. There is a hazy a 100% filling defect roughly halfway down the vessel consistent with thrombus.  Following the initial Angiomax bolus, angiography with the guide catheter revealed almost TIMI 3 flow distally down to very extensive PDA. It was residual 90% focal stenosis in a tapering fashion. RPL Sysytem:The RPAV starts off as a moderate caliber vessel and gives rise to several small posterior lateral branches there may have been a small branch that could have an occluded but not visualized. Review of the initial angiography revealed the clear culprit lesion for the STEMI to be the mid-distal RPDA with significant lesions in the lateral OM and small LAD. She was hemodynamically stable. The decision was made to proceed with PCI on the obvious culprit lesion. The plan would be to readdress the remaining lesions in the later date.  Percutaneous Coronary Intervention: Brilinta 180 mg was administered by mouth.  Guide: 6 Fr JR 4 -- initially appeared to be very deep to engage, however this proved to be difficult to obtain and maintain coaxial vessel engagement.  Guidewire: BMW. Initial it tested as the wire lead to the guide catheter being pushed back due to lack of support. Therefore the predilation balloon was advanced over the wire and used to support the wire to advance down distally into the PDA. Prior to having the wire down, there was TIMI-3 flow into the culprit vessel. Difficulty with guide seating and wire advancement lead to a delay in initial balloon activation. However as there was reperfusion without the balloon, there was less urgency to deployed the initial device.  Predilation Balloon: Emerge 2.0 mm x 12 mm;  8 Atm x 30 Sec Stent #1 : Xience XPEDITION 2.25 mm x 15 mm; this LAD stent was very  difficult to advance into the distal RCA. There is significant loss of guide support. Retrospectively, the 15 mm stent not covering the entire lesion, albeit regrettable was probably the longest stent that would've been able to advance that for distally given the guide support.  12 Atm x 30 Sec, 14 Atm x 30 Sec -- final distal diameter 2.4 mm  200 mcg IC Nitroglycerin  Initial post-stent deployment angiography revealed that although it initially appeared to cover the entire segment, there is proximal plaque shifting of the original lesion. This led to a irregularity at the proximal edge of the stent. The decision was made to proceed with any second stent at the proximal edge. Stent #21 : Xience XPEDITION 2.25 mm x 8 mm;  Deployed at 14 Atm x 45 Sec, final proximal diameter 2.4 millimeter The balloon was then advanced to the overlapping segment and used for post dilation of the overlap.  18 Atm x 45 Sec; Final Diameter = 2.46 mm  Post deployment angiography in multiple views, with and without guidewire in place revealed excellent stent deployment and lesion coverage. There was no evidence of dissection or perforation.  After completing the PCI, the guide catheter was exchanged for the JL 3.5 catheter followed by the pigtail catheter for completion of left coronary artery angiography and left ventriculography. Catheters then removed with the out of body  over wire.  TR Band: 1855 Hours, 16 mL air; nonocclusive hemostasis  PATIENT DISPOSITION:  The patient was transferred to the CCU holding area in a hemodynamicaly stable, chest pain free condition.  The patient tolerated the procedure well, and there were no complications. EBL: < 10 ml  The patient was stable before, during, and after the procedure. POST-OPERATIVE DIAGNOSIS:  Infero-Lateral STEMI with a culprit lesion being an occluded mid-distal RPDA successfully treated with 2 overlapping Xience Xpedition DES stents.  Residual significant lesions  noted in the lateral OM and mid LAD (the LAD itself is relatively diminutive in comparison to the remainder the vessels.)  Well-preserved LV EF with mildly elevated EDP. PLAN OF CARE:  Admit to CCU overnight for post radial cath care. We'll run Angiomax for 4 hours at reduced rate.  Dual antiplatelet therapy for a minimum of 1 year. She was initiated on Brilinta. Care management consultation for medication assistance.  I will ask my colleagues to review the angiography in order to assess the residual lesions in the LAD and circumflex. The question would be other not to proceed with staged PCI of these vessels prior to discharge.  Add statin and beta blocker. Hold ACE inhibitor until the morning. May need when necessary antihypertensive medications overnight.  Hold metformin until a decision is made of what to do with the other lesions. Use sliding scale insulin.  Cardiac Rehabilitation Phase I and 2 consultation.   Treatments: See above  Discharge Exam: Blood pressure 132/71, pulse 62, temperature 98.2 F (36.8 C), temperature source Oral, resp. rate 18, height 5\' 1"  (1.549 m), weight 230 lb 9.6 oz (104.6 kg), SpO2 97.00%.   Disposition:       Discharge Orders   Future Orders Complete By Expires   Amb Referral to Cardiac Rehabilitation  As directed    Diet - low sodium heart healthy  As directed    Increase activity slowly  As directed        Medication List         aspirin 81 MG chewable tablet  Chew 1 tablet (81 mg total) by mouth daily.     carvedilol 6.25 MG tablet  Commonly known as:  COREG  Take 1.5 tablets (9.375 mg total) by mouth 2 (two) times daily with a meal.     Cholecalciferol 1000 UNITS Tbdp  Take 1,000 Units by mouth daily.     glimepiride 4 MG tablet  Commonly known as:  AMARYL  Take 4 mg by mouth daily before breakfast.     irbesartan 150 MG tablet  Commonly known as:  AVAPRO  Take 150 mg by mouth daily.     metFORMIN 500 MG tablet  Commonly known  as:  GLUCOPHAGE  Take 500 mg by mouth 2 (two) times daily with a meal.     METROCREAM 0.75 % cream  Generic drug:  metroNIDAZOLE  Apply topically 2 (two) times daily.     omega-3 acid ethyl esters 1 G capsule  Commonly known as:  LOVAZA  Take 1 g by mouth daily.     rosuvastatin 40 MG tablet  Commonly known as:  CRESTOR  Take 1 tablet (40 mg total) by mouth daily.     Ticagrelor 90 MG Tabs tablet  Commonly known as:  BRILINTA  Take 1 tablet (90 mg total) by mouth 2 (two) times daily.       Follow-up Information   Follow up with Kyung Muto, PA-C. (Our office will call you with  the appt date and time.)    Specialty:  Physician Assistant   Contact information:   [address] [place] [address] [phone]       Signed: Wilburt Finlay 01/05/2013, 10:50 AM

## 2013-01-05 NOTE — Progress Notes (Signed)
Patient given discharge instructions, AVS, and medication list. All items reviewed with patient and all questions answered will discharge home as ordered. With family. Shannelle Alguire, Randall An  rN

## 2013-01-07 ENCOUNTER — Telehealth: Payer: Self-pay | Admitting: Cardiology

## 2013-01-07 LAB — GLUCOSE, CAPILLARY
Glucose-Capillary: 175 mg/dL — ABNORMAL HIGH (ref 70–99)
Glucose-Capillary: 147 mg/dL — ABNORMAL HIGH (ref 70–99)

## 2013-01-07 NOTE — Telephone Encounter (Signed)
Can she drive.  Also needs Brintilta filled.  Having trouble getting  Please call

## 2013-01-07 NOTE — Telephone Encounter (Signed)
I spoke with patient.  She needs a PA on Brilinta from [facility] on [address].  I will proceed with the PA and I provided patient with samples at the front desk. (4 packs)    I spoke with Corine Shelter PA and patient cannot drive x 1 week post procedure.  Patient notified.    I called [facility] and got the Brilinta approved!! [facility] and patient aware.

## 2013-01-10 ENCOUNTER — Telehealth: Payer: Self-pay | Admitting: *Deleted

## 2013-01-10 NOTE — Telephone Encounter (Signed)
Faxed  Cardiac Rehab Phase II  Order form--signed

## 2013-01-22 ENCOUNTER — Ambulatory Visit (INDEPENDENT_AMBULATORY_CARE_PROVIDER_SITE_OTHER): Payer: 59 | Admitting: Physician Assistant

## 2013-01-22 ENCOUNTER — Encounter: Payer: Self-pay | Admitting: Physician Assistant

## 2013-01-22 ENCOUNTER — Other Ambulatory Visit: Payer: Self-pay | Admitting: *Deleted

## 2013-01-22 VITALS — BP 120/70 | HR 78 | Ht 61.0 in | Wt 227.0 lb

## 2013-01-22 DIAGNOSIS — E669 Obesity, unspecified: Secondary | ICD-10-CM

## 2013-01-22 DIAGNOSIS — E785 Hyperlipidemia, unspecified: Secondary | ICD-10-CM

## 2013-01-22 DIAGNOSIS — I2510 Atherosclerotic heart disease of native coronary artery without angina pectoris: Secondary | ICD-10-CM

## 2013-01-22 DIAGNOSIS — I10 Essential (primary) hypertension: Secondary | ICD-10-CM

## 2013-01-22 MED ORDER — TICAGRELOR 90 MG PO TABS: 90.0000 mg | ORAL_TABLET | Freq: Two times a day (BID) | ORAL | Status: DC

## 2013-01-22 MED ORDER — NITROGLYCERIN 0.4 MG SL SUBL: 0.4000 mg | SUBLINGUAL_TABLET | SUBLINGUAL | Status: DC | PRN

## 2013-01-22 MED ORDER — CARVEDILOL 6.25 MG PO TABS: 9.3750 mg | ORAL_TABLET | Freq: Two times a day (BID) | ORAL | Status: DC

## 2013-01-22 MED ORDER — PANTOPRAZOLE SODIUM 40 MG PO TBEC: 40.0000 mg | DELAYED_RELEASE_TABLET | Freq: Every day | ORAL | Status: DC

## 2013-01-22 MED ORDER — ROSUVASTATIN CALCIUM 40 MG PO TABS: 40.0000 mg | ORAL_TABLET | Freq: Every day | ORAL | Status: DC

## 2013-01-22 NOTE — Assessment & Plan Note (Signed)
Treated with generic fish oil and crestor.

## 2013-01-22 NOTE — Patient Instructions (Signed)
1.  Start taking protonix 40mg  daily. 2.  Use the nitroglycerin as we discussed. No more than three tablets. 3.  Followup in 4 weeks with Dr. Herbie Baltimore. 4.  Cardiac rehab 5.  Lifestyle changes are very important.

## 2013-01-22 NOTE — Assessment & Plan Note (Signed)
I referred her to an RD, however, I think the cost is prohibitive.  It sounds like most of her family are vegan and pushing her in that direction.  That is not my preferred direction.  We did discuss plant sources of Omega 3 since she won't be eating fish.

## 2013-01-22 NOTE — Assessment & Plan Note (Signed)
Residual OM and mid LAD disease.  Is this contributing to the patient's symptoms. They only occur at night so I do not think so.  I am adding protonix if indeed this is reflux it may help.  If the symptoms persist after a couple weeks, we can add Imdur. We would need to consider PCI to the other lesions.

## 2013-01-22 NOTE — Progress Notes (Signed)
Date:  01/22/2013   ID:  Sandra Beasley, DOB 17-Feb-1950, MRN [id number]  PCP:   Duane Lope, MD  Primary Cardiologist:  Herbie Baltimore     History of Present Illness: Sandra Beasley is a 62 y.o. female who is morbidly obese and had no prior history of CAD.  On January 02, 2013 she was admitted via EMS from home with an inferior STEMI.  She apparently has a history of NIDDM, HTN, dyslipidemia. She has had prio lap banding.   She was taken to the cath lab which revealed a culprit lesion being an occluded mid-distal RPDA successfully treated with 2 overlapping Xience Xpedition DES stents. She recovered very well. ASA and brilinta were started. A1C was 8.2. She was referred for OP dietary consult.  2D echo revealed preserved EF with no WMA.   She presents today for follow up.  The night time "indigestion" symptom which resolved after stenting, has returned off and on.  No angina during the day or while walking up stairs.   She is concerned that the symptom is originating from her heart.  Cardiac rehab has contacted her and she will start two days per week.  The patient currently denies nausea, vomiting, fever, chest pain, shortness of breath, orthopnea, dizziness, PND, cough, congestion, abdominal pain, hematochezia, melena, lower extremity edema.  Wt Readings from Last 3 Encounters:  01/22/13 227 lb (102.967 kg)  01/02/13 230 lb 9.6 oz (104.6 kg)  01/02/13 230 lb 9.6 oz (104.6 kg)     Past Medical History  Diagnosis Date  . Type 2 diabetes mellitus with circulatory disorder   . Hypertension   . Hyperlipidemia   . Obesity   . STEMI (ST elevation myocardial infarction) 01/02/13  . GERD (gastroesophageal reflux disease)   . Hypercholesteremia     Current Outpatient Prescriptions  Medication Sig Dispense Refill  . aspirin 81 MG chewable tablet Chew 1 tablet (81 mg total) by mouth daily.      . Cholecalciferol 1000 UNITS TBDP Take 1,000 Units by mouth daily.       Marland Kitchen glimepiride (AMARYL) 4 MG  tablet Take 4 mg by mouth daily before breakfast.        . irbesartan (AVAPRO) 150 MG tablet Take 150 mg by mouth daily.      . metFORMIN (GLUCOPHAGE) 500 MG tablet Take 500 mg by mouth 2 (two) times daily with a meal.       . metroNIDAZOLE (METROCREAM) 0.75 % cream Apply topically 2 (two) times daily.      Marland Kitchen omega-3 acid ethyl esters (LOVAZA) 1 G capsule Take 1 g by mouth daily.      . carvedilol (COREG) 6.25 MG tablet Take 1.5 tablets (9.375 mg total) by mouth 2 (two) times daily with a meal.  90 tablet  5  . nitroGLYCERIN (NITROSTAT) 0.4 MG SL tablet Place 1 tablet (0.4 mg total) under the tongue every 5 (five) minutes as needed for chest pain.  25 tablet  12  . pantoprazole (PROTONIX) 40 MG tablet Take 1 tablet (40 mg total) by mouth daily.  30 tablet  11  . rosuvastatin (CRESTOR) 40 MG tablet Take 1 tablet (40 mg total) by mouth daily.  90 tablet  5  . Ticagrelor (BRILINTA) 90 MG TABS tablet Take 1 tablet (90 mg total) by mouth 2 (two) times daily.  180 tablet  3   No current facility-administered medications for this visit.    Allergies:    Allergies  Allergen Reactions  . Ace Inhibitors Cough    Social History:  The patient  reports that she has never smoked. She does not have any smokeless tobacco history on file. She reports that she does not drink alcohol or use illicit drugs.   Family history:   Family History  Problem Relation Age of Onset  . Adopted: Yes  . Colon cancer Neg Hx   . Stomach cancer Neg Hx   . Cancer Maternal Aunt     breast    ROS:  Please see the history of present illness.  All other systems reviewed and negative.   PHYSICAL EXAM: VS:  BP 120/70  Pulse 78  Ht 5\' 1"  (1.549 m)  Wt 227 lb (102.967 kg)  BMI 42.91 kg/m2 Obese, well developed, in no acute distress HEENT: Pupils are equal round react to light accommodation extraocular movements are intact.  Neck: no JVDNo cervical lymphadenopathy. Cardiac: Regular rate and rhythm without murmurs rubs  or gallops. Lungs:  clear to auscultation bilaterally, no wheezing, rhonchi or rales Ext: no lower extremity edema.  2+ radial and dorsalis pedis pulses. Skin: warm and dry Neuro:  Grossly normal  ASSESSMENT AND PLAN:  Problem List Items Addressed This Visit   Hypertension - Primary     BP well controlled at this time.     Relevant Medications      nitroGLYCERIN (NITROSTAT) SL tablet   Hyperlipidemia     Treated with generic fish oil and crestor.    Obesity     I referred her to an RD, however, I think the cost is prohibitive.  It sounds like most of her family are vegan and pushing her in that direction.  That is not my preferred direction.  We did discuss plant sources of Omega 3 since she won't be eating fish.     CAD (coronary artery disease)     Residual OM and mid LAD disease.  Is this contributing to the patient's symptoms. They only occur at night so I do not think so.  I am adding protonix if indeed this is reflux it may help.  If the symptoms persist after a couple weeks, we can add Imdur. We would need to consider PCI to the other lesions.

## 2013-01-22 NOTE — Assessment & Plan Note (Signed)
BP well controlled at this time

## 2013-02-05 ENCOUNTER — Encounter (HOSPITAL_COMMUNITY)
Admission: RE | Admit: 2013-02-05 | Discharge: 2013-02-05 | Disposition: A | Discharge status: Home / Self Care | Payer: 59 | Source: Ambulatory Visit | Attending: Cardiology | Admitting: Cardiology

## 2013-02-05 NOTE — Progress Notes (Signed)
Cardiac Rehab Medication Review by a Pharmacist  Does the patient  feel that his/her medications are working for him/her?  yes  Has the patient been experiencing any side effects to the medications prescribed?  yes  Does the patient measure his/her own blood pressure or blood glucose at home?  no   Does the patient have any problems obtaining medications due to transportation or finances?   no  Understanding of regimen: good Understanding of indications: good Potential of compliance: good    Pharmacist comments: Sandra Beasley is a pleasant 62 yo F presenting to cardiac rehab.  She did not have any medications with her, nor a list of meds.  She was a little confused on a couple meds on what strength she takes (Amaryl and Coreg).   She does not check her BP at home.  She had some questions about long-term use of the Protonix (her daughter had warned her about this).  We discussed and she currently has a "band" around her stomach.  I encouraged her to bring this up with the surgeon when she visits him in February.  The only side effect she reports was scalp soreness 3 weeks ago when starting all the meds, but this has since resolved.  She uses a pill box to help her compliance at home.  [name], PharmD Clinical Pharmacist - Resident Pager: [phone] Pharmacy: [phone] 02/05/2013 9:16 AM

## 2013-02-11 ENCOUNTER — Encounter (HOSPITAL_COMMUNITY)
Admission: RE | Admit: 2013-02-11 | Discharge: 2013-02-11 | Disposition: A | Discharge status: Home / Self Care | Payer: 59 | Source: Ambulatory Visit | Attending: Cardiology | Admitting: Cardiology

## 2013-02-11 DIAGNOSIS — Z5189 Encounter for other specified aftercare: Secondary | ICD-10-CM | POA: Insufficient documentation

## 2013-02-11 DIAGNOSIS — Z9884 Bariatric surgery status: Secondary | ICD-10-CM | POA: Insufficient documentation

## 2013-02-11 DIAGNOSIS — Z6841 Body Mass Index (BMI) 40.0 and over, adult: Secondary | ICD-10-CM | POA: Insufficient documentation

## 2013-02-11 DIAGNOSIS — I252 Old myocardial infarction: Secondary | ICD-10-CM | POA: Insufficient documentation

## 2013-02-11 DIAGNOSIS — E785 Hyperlipidemia, unspecified: Secondary | ICD-10-CM | POA: Insufficient documentation

## 2013-02-11 DIAGNOSIS — E119 Type 2 diabetes mellitus without complications: Secondary | ICD-10-CM | POA: Insufficient documentation

## 2013-02-11 DIAGNOSIS — I2510 Atherosclerotic heart disease of native coronary artery without angina pectoris: Secondary | ICD-10-CM | POA: Insufficient documentation

## 2013-02-11 DIAGNOSIS — E669 Obesity, unspecified: Secondary | ICD-10-CM | POA: Insufficient documentation

## 2013-02-11 DIAGNOSIS — I10 Essential (primary) hypertension: Secondary | ICD-10-CM | POA: Insufficient documentation

## 2013-02-11 LAB — GLUCOSE, CAPILLARY
GLUCOSE-CAPILLARY: 177 mg/dL — AB (ref 70–99)
GLUCOSE-CAPILLARY: 204 mg/dL — AB (ref 70–99)

## 2013-02-11 NOTE — Progress Notes (Signed)
Pt started cardiac rehab today.  Pt tolerated light exercise without difficulty.  VSS, telemetry-NSR.  Asymptomatic. Pt oriented to exercise equipment and routine.  Understanding verbalized.

## 2013-02-13 ENCOUNTER — Encounter (HOSPITAL_COMMUNITY)
Admission: RE | Admit: 2013-02-13 | Discharge: 2013-02-13 | Disposition: A | Discharge status: Home / Self Care | Payer: 59 | Source: Ambulatory Visit | Attending: Cardiology | Admitting: Cardiology

## 2013-02-13 ENCOUNTER — Encounter (HOSPITAL_COMMUNITY): Payer: 59

## 2013-02-13 LAB — GLUCOSE, CAPILLARY
GLUCOSE-CAPILLARY: 188 mg/dL — AB (ref 70–99)
GLUCOSE-CAPILLARY: 177 mg/dL — AB (ref 70–99)

## 2013-02-15 ENCOUNTER — Encounter (HOSPITAL_COMMUNITY)
Admission: RE | Admit: 2013-02-15 | Discharge: 2013-02-15 | Disposition: A | Discharge status: Home / Self Care | Payer: 59 | Source: Ambulatory Visit | Attending: Cardiology | Admitting: Cardiology

## 2013-02-15 ENCOUNTER — Encounter (HOSPITAL_COMMUNITY): Payer: 59

## 2013-02-15 LAB — GLUCOSE, CAPILLARY
Glucose-Capillary: 146 mg/dL — ABNORMAL HIGH (ref 70–99)
Glucose-Capillary: 107 mg/dL — ABNORMAL HIGH (ref 70–99)

## 2013-02-18 ENCOUNTER — Encounter (HOSPITAL_COMMUNITY)
Admission: RE | Admit: 2013-02-18 | Discharge: 2013-02-18 | Disposition: A | Discharge status: Home / Self Care | Payer: 59 | Source: Ambulatory Visit | Attending: Cardiology | Admitting: Cardiology

## 2013-02-18 ENCOUNTER — Encounter (HOSPITAL_COMMUNITY): Payer: 59

## 2013-02-19 ENCOUNTER — Ambulatory Visit (INDEPENDENT_AMBULATORY_CARE_PROVIDER_SITE_OTHER): Payer: 59 | Admitting: Cardiology

## 2013-02-19 ENCOUNTER — Encounter: Payer: Self-pay | Admitting: Cardiology

## 2013-02-19 VITALS — BP 142/86 | HR 78 | Ht 61.5 in | Wt 232.8 lb

## 2013-02-19 DIAGNOSIS — I2119 ST elevation (STEMI) myocardial infarction involving other coronary artery of inferior wall: Secondary | ICD-10-CM

## 2013-02-19 DIAGNOSIS — I2510 Atherosclerotic heart disease of native coronary artery without angina pectoris: Secondary | ICD-10-CM

## 2013-02-19 DIAGNOSIS — Z9884 Bariatric surgery status: Secondary | ICD-10-CM

## 2013-02-19 DIAGNOSIS — Z9861 Coronary angioplasty status: Secondary | ICD-10-CM

## 2013-02-19 DIAGNOSIS — E669 Obesity, unspecified: Secondary | ICD-10-CM

## 2013-02-19 DIAGNOSIS — Z79899 Other long term (current) drug therapy: Secondary | ICD-10-CM

## 2013-02-19 DIAGNOSIS — I10 Essential (primary) hypertension: Secondary | ICD-10-CM

## 2013-02-19 DIAGNOSIS — E785 Hyperlipidemia, unspecified: Secondary | ICD-10-CM

## 2013-02-19 NOTE — Patient Instructions (Signed)
Your physician has requested that you have en exercise stress myoview. For further information please visit [email or web address]. Please follow instruction sheet, as given. If test comes back abnormal will see you and do a cardiac cth  Labs in march 2015 --[place]   Your physician wants you to follow-up in [place].  You will receive a reminder letter in the mail two months in advance. If you don't receive a letter, please call our office to schedule the follow-up appointment.

## 2013-02-19 NOTE — Progress Notes (Signed)
PATIENT: Sandra Beasley MRN: [id number]  DOB: [birth year]   DOV:02/24/2013 PCP:  Melinda Crutch, MD  Clinic Note: Chief Complaint  Patient presents with  . 1 month follow up    no chest pain ,no sob, no edema---doing cardiac rehab   HPI: Sandra Beasley is a 63 y.o.  female with a PMH below (obesity status post LAP-BAND, diabetes, hypertension hyperlipidemia) who presents today for following her STEMI. She saw Tarri Fuller on December 16. I first met her on November 26 when she presented with an inferolateral STEMI where she was noted to have an occluded RCA treated with a 2 overlapping Xience Xpedition DES stents. She also had lesions in the lateral OM as well as in the LAD. These were planned to be evaluated post discharge.  Marland Kitchen Her notable SSx of "Angina" with her MI was described as indigestion -- that she noted  occurring during the day as opposed to @ night.  Was associated with diaphoresis.    When she saw Gaspar Bidding, back in December she was noting some GERD-type symptoms that she was somewhat concerned were potentially anginal in nature. He started her on Protonix, and relatively shortly after starting that her symptoms got better. She is given a start cardiac rehabilitation and has been enjoying getting herself active and been exercising. She is overall feeling relatively great. She is concerned about using a PPI long-term since she is status post LAP-BAND surgery. She is concerned that this may cause poor digestion. One thing that she is done to help alleviate some indigestion symptoms that she went back to eating and basically begin diet. She is hoping that with activity diet and getting back into exercise now to cardiac rehabilitation as she can get herself back into shape and lose weight to reduce her cardiac risk factors.  Interval History: She presents today doing quite well without major symptoms to speak of. She denies any chest tightness pressure with rest or exertion. She still has some of  the GERD but is much improved after starting Protonix. She denies any PND, orthopnea or edema. No rash there are regular heart beats. No lightheadedness, dizziness, wooziness recently/near syncope. No TIA or amaurosis fugax symptoms. No melena, and hematuria. No complications.  Past Medical History  Diagnosis Date  . Type 2 diabetes mellitus with circulatory disorder   . Hypertension   . Hyperlipidemia   . Obesity   . STEMI (ST elevation myocardial infarction) 01/02/13    occluded RCA with moderate to  severe distal LAD and proximal OM1 lesions. --> PCI to RCA with 2 overlapping Xience XpeditionDES 2.25 mm by 15 mm and 8 mm overlapping.  Marland Kitchen GERD (gastroesophageal reflux disease)   . Hypercholesteremia    Prior Cardiac Evaluation and Past Surgical History: Past Surgical History  Procedure Laterality Date  . Tonsillectomy  1966  . Laparoscopic hysterectomy  1996  . Laparoscopic gastric banding  2006  . Abdominal hysterectomy    . Cardiac catheterization  01/02/2013    100% mid-distal RCA.; 70-80% proximal OM, distal LAD 80%.  . Coronary angioplasty with stent placement  01/02/2013     PCI-RCA: Xience Xpedition 2.25 mm x 15 mm, 2.25 mm x 8 mm (2.4 mm)  . Transthoracic echocardiogram  01/04/2013    EF 60 and 65%. Grade 1 diastolic dysfunction with no regional wall motion abnormalities. Otherwise relatively normal.    Allergies  Allergen Reactions  . Ace Inhibitors Cough    Current Outpatient Prescriptions  Medication Sig Dispense  Refill  . aspirin 81 MG chewable tablet Chew 1 tablet (81 mg total) by mouth daily.      . carvedilol (COREG) 6.25 MG tablet Take 6.25 mg by mouth 2 (two) times daily with a meal.      . Cholecalciferol 1000 UNITS TBDP Take 1,000 Units by mouth every morning.       Marland Kitchen glimepiride (AMARYL) 4 MG tablet Take 4 mg by mouth daily before breakfast.        . irbesartan (AVAPRO) 150 MG tablet Take 150 mg by mouth every morning.       . metFORMIN (GLUCOPHAGE) 500 MG  tablet Take 500 mg by mouth 2 (two) times daily with a meal.       . metroNIDAZOLE (METROCREAM) 0.75 % cream Apply topically 2 (two) times daily. Apply to face      . nitroGLYCERIN (NITROSTAT) 0.4 MG SL tablet Place 1 tablet (0.4 mg total) under the tongue every 5 (five) minutes as needed for chest pain.  25 tablet  12  . omega-3 acid ethyl esters (LOVAZA) 1 G capsule Take 1 g by mouth every morning.       . pantoprazole (PROTONIX) 40 MG tablet Take 1 tablet (40 mg total) by mouth daily.  30 tablet  11  . rosuvastatin (CRESTOR) 40 MG tablet Take 40 mg by mouth every evening.      . Ticagrelor (BRILINTA) 90 MG TABS tablet Take 1 tablet (90 mg total) by mouth 2 (two) times daily.  180 tablet  3   No current facility-administered medications for this visit.    History   Social History Narrative   She is a divorced nonsmoker who works for [employer]. She is currently now in cardiac rehabilitation. Has changed her dietary habits to a Vegan lifestyle.   ROS: A comprehensive Review of Systems - Negative except Symptoms that are in history of present illness. Mostly some mild indigestion on as described above.  PHYSICAL EXAM BP 142/86  Pulse 78  Ht 5' 1.5" (1.562 m)  Wt 232 lb 12.8 oz (105.597 kg)  BMI 43.28 kg/m2 GENERAL:Obese, well developed, in no acute distress; pleasant mood and affect. Well groomed. HEENT: [place]/AT, EOMI, MMM, anicteric sclera Neck: no JVD; No cervical lymphadenopathy. No carotid bruit Cardiac: RRR, no M./R./G. Nondisplaced PMI difficult to palpate due to body habitus. .  Lungs: CTAP,, no wheezing, rhonchi or rales; nonlabored with good air movement. Ext: no lower extremity edema. 2+ radial and dorsalis pedis pulses.  Skin: warm and dry  Neuro: Grossly normal  OMB:TDHRCBULA today: No  Recent Labs: None since while at the hospital. -- Total cholesterol 228, HDL 60, LDL 144, and TG 108.  ASSESSMENT / PLAN: ST segment elevation myocardial infarction (STEMI) of  inferolateral wall, subsequent episode of care Relatively well post MA. She is in cardiac rehabilitation and doing it immensely. Cc it really taking the dietary changes to heart. I would be concerned with the indigestion she is not having but that is related to the PPI is relatively relieving. She actually has a preserved EF with no adverse side effects of the MI.  CAD S/P percutaneous coronary angioplasty - DES x2 in RCA Revascularized RCA the residual disease in the OM and LAD. There having reflux type symptoms which are somewhat similar to her anginal equivalent. I like to just make sure that these lesions are actually causing symptoms. The plan had been outpatient nuclear stress test once her MI was recovered. She is on  Ticagrelor and aspirin, and certainly that can be only intermittent of her GERD worse with her having had a lap band. I don't think it is a problem with her being a PPI with her lap-band.  She will remain on 200 therapy for at least one year if not longer. With that in mind I would like to go ahead and expedite finding and if she requires any additional revascularization with stents.   Plan: Treadmill Myoview he can be converted to a LexiScan if need be -- to evaluate stent patency and existing left-sided disease.  Hypertension Blood pressure is a little elevated today. She is on a good dose of beta blocker, we could be increased if she continues to have some elevated blood pressures. I don't do this prior to her stress test for fear of having some rebound effect during the test. Afterwards see her back we can consider increasing if blood pressure remains elevated. She is also on an ARB dose that is reasonable.   Hyperlipidemia LDL goal <70 Her last lipids actually didn't look that she cries only horrible but her LDL is certainly high as is her total cholesterol. She was started on Crestor in the hospital and is tolerating it okay it is along with her visual. I will then recheck a  lipid panel without tease and chemistries when she comes in for a stress test. I want to be at least a few months out from her MI. Hopefully her Vegan diet will help with both weight loss and lipids.  Severe obesity (BMI >= 40) Adjusting her diet and now doing exercises cardiac rehabilitation. The key thing is that she continues to exercise when rehabilitation his is over. She Is Status Post Lap-Band and had a 90 pound weight loss initially.  10 cm Lapband July 2006 She gained about 20 and 90 pounds he lost back to have; baseline loss from preoperatively. She is due to see her surgeons in his is concerned maybe she took out drain and it was causing some of her indigestion symptoms. I don't think she should have a problem taking a PPI without but would defer to surgeons one of his potential options but with her being on dual antiplatelet therapy with almost a mandatory adjuvant treatment have some type of a protective therapy for the GI tract.    Orders Placed This Encounter  Procedures  . Lipid panel    Standing Status: Future     Number of Occurrences:      Standing Expiration Date: 02/19/2014    Order Specific Question:  Has the patient fasted?    Answer:  Yes  . Comprehensive metabolic panel    Standing Status: Future     Number of Occurrences:      Standing Expiration Date: 02/19/2014    Order Specific Question:  Has the patient fasted?    Answer:  Yes  . Myocardial Perfusion Imaging    Standing Status: Future     Number of Occurrences:      Standing Expiration Date: 02/19/2014    Order Specific Question:  Where should this test be performed    Answer:  [facility]    Order Specific Question:  Type of stress    Answer:  Exercise    Order Specific Question:  Patient weight in lbs    Answer:  232   No orders of the defined types were placed in this encounter.    Followup: 3 months  Christophor Eick W. Ellyn Hack, M.D., M.S.  [address] [place], [place]  [id number]  [phone] Pager # [phone]

## 2013-02-20 ENCOUNTER — Encounter (HOSPITAL_COMMUNITY): Payer: 59

## 2013-02-20 ENCOUNTER — Telehealth: Payer: Self-pay | Admitting: Cardiology

## 2013-02-20 ENCOUNTER — Encounter (HOSPITAL_COMMUNITY)
Admission: RE | Admit: 2013-02-20 | Discharge: 2013-02-20 | Disposition: A | Discharge status: Home / Self Care | Payer: 59 | Source: Ambulatory Visit | Attending: Cardiology | Admitting: Cardiology

## 2013-02-20 NOTE — Telephone Encounter (Signed)
Please call Maria-concerning Particia Nearing.

## 2013-02-20 NOTE — Telephone Encounter (Signed)
UNABLE TO CALL--. AFTER HOURS

## 2013-02-22 ENCOUNTER — Encounter (HOSPITAL_COMMUNITY)
Admission: RE | Admit: 2013-02-22 | Discharge: 2013-02-22 | Disposition: A | Discharge status: Home / Self Care | Payer: 59 | Source: Ambulatory Visit | Attending: Cardiology | Admitting: Cardiology

## 2013-02-22 ENCOUNTER — Encounter (HOSPITAL_COMMUNITY): Payer: 59

## 2013-02-24 ENCOUNTER — Encounter: Payer: Self-pay | Admitting: Cardiology

## 2013-02-24 DIAGNOSIS — E1169 Type 2 diabetes mellitus with other specified complication: Secondary | ICD-10-CM | POA: Insufficient documentation

## 2013-02-24 DIAGNOSIS — E785 Hyperlipidemia, unspecified: Secondary | ICD-10-CM | POA: Insufficient documentation

## 2013-02-24 NOTE — Assessment & Plan Note (Signed)
Relatively well post MA. She is in cardiac rehabilitation and doing it immensely. Cc it really taking the dietary changes to heart. I would be concerned with the indigestion she is not having but that is related to the PPI is relatively relieving. She actually has a preserved EF with no adverse side effects of the MI.

## 2013-02-24 NOTE — Assessment & Plan Note (Addendum)
Her last lipids actually didn't look that she cries only horrible but her LDL is certainly high as is her total cholesterol. She was started on Crestor in the hospital and is tolerating it okay it is along with her visual. I will then recheck a lipid panel without tease and chemistries when she comes in for a stress test. I want to be at least a few months out from her MI. Hopefully her Vegan diet will help with both weight loss and lipids.

## 2013-02-24 NOTE — Assessment & Plan Note (Signed)
Adjusting her diet and now doing exercises cardiac rehabilitation. The key thing is that she continues to exercise when rehabilitation his is over. She Is Status Post Lap-Band and had a 90 pound weight loss initially.

## 2013-02-24 NOTE — Assessment & Plan Note (Addendum)
Revascularized RCA the residual disease in the OM and LAD. There having reflux type symptoms which are somewhat similar to her anginal equivalent. I like to just make sure that these lesions are actually causing symptoms. The plan had been outpatient nuclear stress test once her MI was recovered. She is on Ticagrelor and aspirin, and certainly that can be only intermittent of her GERD worse with her having had a lap band. I don't think it is a problem with her being a PPI with her lap-band.  She will remain on 200 therapy for at least one year if not longer. With that in mind I would like to go ahead and expedite finding and if she requires any additional revascularization with stents.   Plan: Treadmill Myoview he can be converted to a LexiScan if need be -- to evaluate stent patency and existing left-sided disease.

## 2013-02-24 NOTE — Assessment & Plan Note (Addendum)
Blood pressure is a little elevated today. She is on a good dose of beta blocker, we could be increased if she continues to have some elevated blood pressures. I don't do this prior to her stress test for fear of having some rebound effect during the test. Afterwards see her back we can consider increasing if blood pressure remains elevated. She is also on an ARB dose that is reasonable.

## 2013-02-24 NOTE — Assessment & Plan Note (Signed)
She gained about 20 and 90 pounds he lost back to have; baseline loss from preoperatively. She is due to see her surgeons in his is concerned maybe she took out drain and it was causing some of her indigestion symptoms. I don't think she should have a problem taking a PPI without but would defer to surgeons one of his potential options but with her being on dual antiplatelet therapy with almost a mandatory adjuvant treatment have some type of a protective therapy for the GI tract.

## 2013-02-25 ENCOUNTER — Encounter (HOSPITAL_COMMUNITY): Payer: 59

## 2013-02-26 ENCOUNTER — Ambulatory Visit (HOSPITAL_COMMUNITY)
Admission: RE | Admit: 2013-02-26 | Discharge: 2013-02-26 | Disposition: A | Discharge status: Home / Self Care | Payer: 59 | Source: Ambulatory Visit | Attending: Internal Medicine | Admitting: Internal Medicine

## 2013-02-26 DIAGNOSIS — I2119 ST elevation (STEMI) myocardial infarction involving other coronary artery of inferior wall: Secondary | ICD-10-CM

## 2013-02-26 DIAGNOSIS — Z9861 Coronary angioplasty status: Secondary | ICD-10-CM

## 2013-02-26 DIAGNOSIS — I252 Old myocardial infarction: Secondary | ICD-10-CM | POA: Insufficient documentation

## 2013-02-26 DIAGNOSIS — I2510 Atherosclerotic heart disease of native coronary artery without angina pectoris: Secondary | ICD-10-CM | POA: Insufficient documentation

## 2013-02-26 MED ORDER — TECHNETIUM TC 99M SESTAMIBI GENERIC - CARDIOLITE
30.4000 | Freq: Once | INTRAVENOUS | Status: AC | PRN
  Administered 2013-02-26: 30 via INTRAVENOUS

## 2013-02-26 MED ORDER — TECHNETIUM TC 99M SESTAMIBI GENERIC - CARDIOLITE
10.5000 | Freq: Once | INTRAVENOUS | Status: AC | PRN
  Administered 2013-02-26: 11 via INTRAVENOUS

## 2013-02-26 NOTE — Procedures (Addendum)
[facility] [address] [place] [address] [phone]  Cardiology Nuclear Med Study  Sandra Beasley is a 63 y.o. female     MRN : [id number]     DOB: 10-19-1950  Procedure Date: 02/26/2013  Nuclear Med Background Indication for Stress Test:  Stent Patency History:  CAD;STENT/PTCA--01/02/13;MI--01/02/13 Cardiac Risk Factors: Family History - CAD, Hypertension, Lipids, NIDDM and Obesity  Symptoms:  DOE and Fatigue   Nuclear Pre-Procedure Caffeine/Decaff Intake:  1:00am NPO After: 11am   IV Site: R Forearm  IV 0.9% NS with Angio Cath:  22g  Chest Size (in):  n/a IV Started by: Azucena Cecil, RN  Height: 5' 1.5" (1.562 m)  Cup Size: D  BMI:  Body mass index is 43.13 kg/(m^2). Weight:  232 lb (105.235 kg)   Tech Comments:  n/a    Nuclear Med Study 1 or 2 day study: 1 day  Stress Test Type:  Stress  Order Authorizing Provider:  Glenetta Hew, MD   Resting Radionuclide: Technetium 60m Sestamibi  Resting Radionuclide Dose: 10.5 mCi   Stress Radionuclide:  Technetium 77m Sestamibi  Stress Radionuclide Dose: 30.4 mCi           Stress Protocol Rest HR: 70 Stress HR: 133  Rest BP: 174/95 Stress BP: 212/97  Exercise Time (min): 8:03 METS: 7.4   Predicted Max HR: 156 bpm % Max HR: 85.26 bpm Rate Pressure Product: 28329  Dose of Adenosine (mg):  n/a Dose of Lexiscan: n/a mg  Dose of Atropine (mg): n/a Dose of Dobutamine: n/a mcg/kg/min (at max HR)  Stress Test Technologist: Leane Para, CCT Nuclear Technologist: Imagene Riches, CNMT   Rest Procedure:  Myocardial perfusion imaging was performed at rest 45 minutes following the intravenous administration of Technetium 27m Sestamibi. Stress Procedure:  The patient performed treadmill exercise using a Bruce  Protocol for 8:03 minutes. The patient stopped due to Patient's request, SOB and Fatigue and denied any chest pain.  There were no significant ST-T wave changes.   Technetium 54m Sestamibi was injected at peak exercise and myocardial perfusion imaging was performed after a brief delay.  Transient Ischemic Dilatation (Normal <1.22):  0.85 Lung/Heart Ratio (Normal <0.45):  0.22 QGS EDV:  69 ml QGS ESV:  26 ml LV Ejection Fraction: 62%  Rest ECG: NSR - Normal EKG  Stress ECG: No significant change from baseline ECG  QPS Raw Data Images:  Normal; no motion artifact; normal heart/lung ratio. Stress Images:  Normal homogeneous uptake in all areas of the myocardium. Rest Images:  Normal homogeneous uptake in all areas of the myocardium. Subtraction (SDS):  No evidence of ischemia.  Impression Exercise Capacity:  Good exercise capacity. BP Response:  Hypertensive blood pressure response. Clinical Symptoms:  Fatigue, dyspnea ECG Impression:  No significant ST segment change suggestive of ischemia. Comparison with Prior Nuclear Study: No previous nuclear study performed  Overall Impression:  Normal stress nuclear study. Hypertension at baseline with a hypertensive response to exercise.  LV Wall Motion:  NL LV Function; NL Wall Motion; EF 62%.  Pixie Casino, MD, [facility] Board Certified in Nuclear Cardiology Attending Cardiologist [place], MD  02/26/2013 5:08 PM

## 2013-02-27 ENCOUNTER — Encounter (HOSPITAL_COMMUNITY): Payer: 59

## 2013-02-27 ENCOUNTER — Encounter (HOSPITAL_COMMUNITY)
Admission: RE | Admit: 2013-02-27 | Discharge: 2013-02-27 | Disposition: A | Discharge status: Home / Self Care | Payer: 59 | Source: Ambulatory Visit | Attending: Cardiology | Admitting: Cardiology

## 2013-03-01 ENCOUNTER — Encounter (HOSPITAL_COMMUNITY)
Admission: RE | Admit: 2013-03-01 | Discharge: 2013-03-01 | Disposition: A | Discharge status: Home / Self Care | Payer: 59 | Source: Ambulatory Visit | Attending: Cardiology | Admitting: Cardiology

## 2013-03-01 ENCOUNTER — Encounter (HOSPITAL_COMMUNITY): Payer: 59

## 2013-03-01 NOTE — Progress Notes (Signed)
Quick Note:  Great RESULT!!! - No evidence of ongoing significant Coronary Artery Stenosis (blockage).  Normal Function -- Good Exercise tolerance.  Leonie Man, MD  ______

## 2013-03-04 ENCOUNTER — Telehealth: Payer: Self-pay | Admitting: *Deleted

## 2013-03-04 ENCOUNTER — Encounter (HOSPITAL_COMMUNITY): Payer: 59

## 2013-03-04 ENCOUNTER — Encounter (HOSPITAL_COMMUNITY)
Admission: RE | Admit: 2013-03-04 | Discharge: 2013-03-04 | Disposition: A | Discharge status: Home / Self Care | Payer: 59 | Source: Ambulatory Visit | Attending: Cardiology | Admitting: Cardiology

## 2013-03-04 NOTE — Progress Notes (Signed)
Reviewed home exercise with pt today.  Pt plans to walk and use [employer] for exercise.  Reviewed THR, pulse, RPE, sign and symptoms, NTG use, and when to call 911 or MD.  Pt voiced understanding. Alberteen Sam, MA, ACSM RCEP

## 2013-03-04 NOTE — Telephone Encounter (Signed)
Message copied by Raiford Simmonds on Mon Mar 04, 2013 11:48 AM ------      Message from: Leonie Man      Created: Fri Mar 01, 2013  6:37 PM       Great RESULT!!! - No evidence of ongoing significant Coronary Artery Stenosis (blockage).        Normal Function -- Good Exercise tolerance.            Leonie Man, MD       ------

## 2013-03-04 NOTE — Telephone Encounter (Signed)
Spoke to patient. MYOVIEW  Result given . Verbalized understanding  

## 2013-03-06 ENCOUNTER — Telehealth (HOSPITAL_COMMUNITY): Payer: Self-pay | Admitting: Family Medicine

## 2013-03-06 ENCOUNTER — Encounter (INDEPENDENT_AMBULATORY_CARE_PROVIDER_SITE_OTHER): Payer: Self-pay | Admitting: Surgery

## 2013-03-06 ENCOUNTER — Encounter (HOSPITAL_COMMUNITY): Payer: 59

## 2013-03-06 ENCOUNTER — Ambulatory Visit (INDEPENDENT_AMBULATORY_CARE_PROVIDER_SITE_OTHER): Payer: 59 | Admitting: Surgery

## 2013-03-06 VITALS — BP 142/88 | HR 78 | Temp 98.7°F | Resp 18 | Ht 61.5 in | Wt 233.8 lb

## 2013-03-06 DIAGNOSIS — K219 Gastro-esophageal reflux disease without esophagitis: Secondary | ICD-10-CM

## 2013-03-06 DIAGNOSIS — Z9884 Bariatric surgery status: Secondary | ICD-10-CM

## 2013-03-06 HISTORY — DX: Gastro-esophageal reflux disease without esophagitis: K21.9

## 2013-03-06 NOTE — Telephone Encounter (Signed)
Please  Call Verdis Frederickson after 1:15 please.

## 2013-03-06 NOTE — Progress Notes (Signed)
Sandra Beasley comes in today I have not seen her since November 2013.  For a while she was a vegan.  However in place giving she had a heart attack and had a stent placed. She is on multiple medications. She is having some nocturnal reflux which is well treated with protonic. Her daughter who is a doctor of [place] medicine suggested she get off the protonic 6 and she was to become a vegan again. 2 power wishes I went ahead and removed the fluid from her band today. I removed 1.25 cc. I will  see her again as needed.

## 2013-03-06 NOTE — Telephone Encounter (Signed)
Left message for Sandra Beasley to return call

## 2013-03-06 NOTE — Telephone Encounter (Signed)
SPOKE TO Sandra Beasley- PATIENT IS WELL IN REHAB. AWARE THAT PATIENT DID WELL ON STRESS TEST.

## 2013-03-08 ENCOUNTER — Encounter (HOSPITAL_COMMUNITY)
Admission: RE | Admit: 2013-03-08 | Discharge: 2013-03-08 | Disposition: A | Discharge status: Home / Self Care | Payer: 59 | Source: Ambulatory Visit | Attending: Cardiology | Admitting: Cardiology

## 2013-03-08 ENCOUNTER — Encounter (HOSPITAL_COMMUNITY): Payer: 59

## 2013-03-11 ENCOUNTER — Encounter (HOSPITAL_COMMUNITY): Payer: 59

## 2013-03-11 ENCOUNTER — Encounter (HOSPITAL_COMMUNITY)
Admission: RE | Admit: 2013-03-11 | Discharge: 2013-03-11 | Disposition: A | Discharge status: Home / Self Care | Payer: 59 | Source: Ambulatory Visit | Attending: Cardiology | Admitting: Cardiology

## 2013-03-11 DIAGNOSIS — E119 Type 2 diabetes mellitus without complications: Secondary | ICD-10-CM | POA: Insufficient documentation

## 2013-03-11 DIAGNOSIS — Z9884 Bariatric surgery status: Secondary | ICD-10-CM | POA: Insufficient documentation

## 2013-03-11 DIAGNOSIS — I2119 ST elevation (STEMI) myocardial infarction involving other coronary artery of inferior wall: Secondary | ICD-10-CM | POA: Insufficient documentation

## 2013-03-11 DIAGNOSIS — I10 Essential (primary) hypertension: Secondary | ICD-10-CM | POA: Insufficient documentation

## 2013-03-11 DIAGNOSIS — Z6841 Body Mass Index (BMI) 40.0 and over, adult: Secondary | ICD-10-CM | POA: Insufficient documentation

## 2013-03-11 DIAGNOSIS — E669 Obesity, unspecified: Secondary | ICD-10-CM | POA: Insufficient documentation

## 2013-03-11 DIAGNOSIS — E785 Hyperlipidemia, unspecified: Secondary | ICD-10-CM | POA: Insufficient documentation

## 2013-03-11 DIAGNOSIS — I2510 Atherosclerotic heart disease of native coronary artery without angina pectoris: Secondary | ICD-10-CM | POA: Insufficient documentation

## 2013-03-11 DIAGNOSIS — Z5189 Encounter for other specified aftercare: Secondary | ICD-10-CM | POA: Insufficient documentation

## 2013-03-13 ENCOUNTER — Encounter (HOSPITAL_COMMUNITY)
Admission: RE | Admit: 2013-03-13 | Discharge: 2013-03-13 | Disposition: A | Discharge status: Home / Self Care | Payer: Self-pay | Source: Ambulatory Visit | Attending: Cardiology | Admitting: Cardiology

## 2013-03-13 ENCOUNTER — Encounter (HOSPITAL_COMMUNITY): Payer: 59

## 2013-03-13 ENCOUNTER — Telehealth: Payer: Self-pay | Admitting: *Deleted

## 2013-03-13 DIAGNOSIS — Z9884 Bariatric surgery status: Secondary | ICD-10-CM | POA: Insufficient documentation

## 2013-03-13 DIAGNOSIS — I2119 ST elevation (STEMI) myocardial infarction involving other coronary artery of inferior wall: Secondary | ICD-10-CM | POA: Insufficient documentation

## 2013-03-13 DIAGNOSIS — I10 Essential (primary) hypertension: Secondary | ICD-10-CM | POA: Insufficient documentation

## 2013-03-13 DIAGNOSIS — Z6841 Body Mass Index (BMI) 40.0 and over, adult: Secondary | ICD-10-CM | POA: Insufficient documentation

## 2013-03-13 DIAGNOSIS — Z5189 Encounter for other specified aftercare: Secondary | ICD-10-CM | POA: Insufficient documentation

## 2013-03-13 DIAGNOSIS — I2510 Atherosclerotic heart disease of native coronary artery without angina pectoris: Secondary | ICD-10-CM | POA: Insufficient documentation

## 2013-03-13 DIAGNOSIS — E785 Hyperlipidemia, unspecified: Secondary | ICD-10-CM | POA: Insufficient documentation

## 2013-03-13 DIAGNOSIS — E669 Obesity, unspecified: Secondary | ICD-10-CM | POA: Insufficient documentation

## 2013-03-13 NOTE — Telephone Encounter (Signed)
Order for maintenance order for cardiac rehab ll

## 2013-03-15 ENCOUNTER — Encounter (HOSPITAL_COMMUNITY): Payer: 59

## 2013-03-15 ENCOUNTER — Encounter (HOSPITAL_COMMUNITY)
Admission: RE | Admit: 2013-03-15 | Discharge: 2013-03-15 | Disposition: A | Discharge status: Home / Self Care | Payer: Self-pay | Source: Ambulatory Visit | Attending: Cardiology | Admitting: Cardiology

## 2013-03-18 ENCOUNTER — Encounter (HOSPITAL_COMMUNITY): Payer: Self-pay

## 2013-03-18 ENCOUNTER — Encounter (HOSPITAL_COMMUNITY): Payer: 59

## 2013-03-19 ENCOUNTER — Telehealth: Payer: Self-pay | Admitting: Cardiology

## 2013-03-19 NOTE — Telephone Encounter (Signed)
Pt is real dizzy ,had stomach virus for 3 days,been taking her medicine.Need some advice about what to do about her medicine,since nothing is staying in her. If she doesn't answer please call [phone].

## 2013-03-19 NOTE — Telephone Encounter (Signed)
Returned call and pt verified x 2.  Pt c/o having the stomach flu for a few days.  Stated the worst symptoms are over.  Stated she has only been taking her AM meds b/c she has been so weak.  Pt c/o feeling dizzy and sweating when standing and thinks her BP is dropping low.  Wanted to know which meds she can stop taking for a few days b/c she hasn't been eating until the last two days and that hasn't been much.  Stated she is just now able to tolerate drinking water.  Advice  Hold carvedilol, irbesartan, Crestor, Protonix and Lovaza for 1-2 days  TAKE Brilinta as directed  Increase water intake  Clear liquids and advance diet as tolerated  Check BG now as low BG can cause same symptoms  Orthostatic precautions reviewed  Pt verbalized understanding and agreed w/ plan.  Message forwarded to Dr. Ellyn Hack [facility]).

## 2013-03-20 ENCOUNTER — Encounter (HOSPITAL_COMMUNITY): Payer: Self-pay

## 2013-03-20 ENCOUNTER — Encounter (HOSPITAL_COMMUNITY): Payer: 59

## 2013-03-20 NOTE — Telephone Encounter (Signed)
Sounds good.  Sandra Man, MD

## 2013-03-22 ENCOUNTER — Encounter (HOSPITAL_COMMUNITY): Payer: 59

## 2013-03-22 ENCOUNTER — Encounter (HOSPITAL_COMMUNITY): Payer: Self-pay

## 2013-03-25 ENCOUNTER — Encounter (HOSPITAL_COMMUNITY): Payer: 59

## 2013-03-25 ENCOUNTER — Encounter (HOSPITAL_COMMUNITY): Payer: Self-pay

## 2013-03-27 ENCOUNTER — Encounter (HOSPITAL_COMMUNITY): Payer: 59

## 2013-03-27 ENCOUNTER — Encounter (HOSPITAL_COMMUNITY)
Admission: RE | Admit: 2013-03-27 | Discharge: 2013-03-27 | Disposition: A | Discharge status: Home / Self Care | Payer: Self-pay | Source: Ambulatory Visit | Attending: Cardiology | Admitting: Cardiology

## 2013-03-28 ENCOUNTER — Other Ambulatory Visit: Payer: Self-pay | Admitting: *Deleted

## 2013-03-28 ENCOUNTER — Telehealth: Payer: Self-pay | Admitting: *Deleted

## 2013-03-28 DIAGNOSIS — E785 Hyperlipidemia, unspecified: Secondary | ICD-10-CM

## 2013-03-28 DIAGNOSIS — Z79899 Other long term (current) drug therapy: Secondary | ICD-10-CM

## 2013-03-28 NOTE — Telephone Encounter (Signed)
Mailed letter and lab slips

## 2013-03-28 NOTE — Telephone Encounter (Signed)
Message copied by Raiford Simmonds on Thu Mar 28, 2013  9:58 AM ------      Message from: Nixa, Gardner: Tue Feb 19, 2013 12:15 PM       Labs 04/2013  cmp lipids ------

## 2013-03-29 ENCOUNTER — Encounter (HOSPITAL_COMMUNITY): Payer: 59

## 2013-03-29 ENCOUNTER — Encounter (HOSPITAL_COMMUNITY): Payer: Self-pay

## 2013-04-01 ENCOUNTER — Encounter (HOSPITAL_COMMUNITY): Payer: Self-pay

## 2013-04-01 ENCOUNTER — Encounter (HOSPITAL_COMMUNITY): Payer: 59

## 2013-04-03 ENCOUNTER — Encounter (HOSPITAL_COMMUNITY): Payer: Self-pay

## 2013-04-03 ENCOUNTER — Telehealth: Payer: Self-pay | Admitting: Cardiology

## 2013-04-03 ENCOUNTER — Encounter (HOSPITAL_COMMUNITY): Payer: 59

## 2013-04-03 NOTE — Telephone Encounter (Signed)
Please call-Been going to Cardiac Rehab and she have developed bad back problems.

## 2013-04-03 NOTE — Telephone Encounter (Signed)
Returned call to patient. Reports that she had exercise nuclear stress test on 1/26 and ever since then she has been having hip pain. Reports she has been going to cardiac rehab at [facility] and the hip pain is worse after rehab. She reports it is worse with walking, is inhibiting sleep and she has been missing worse r/t the severity of the pain. She has been take advil OTC. Will defer to Dr Sharlyn Bologna to see if should be prescribed pain med/ortho referrral?

## 2013-04-03 NOTE — Telephone Encounter (Signed)
Please call her at this number.. [phone].Marland Kitchen Which is her home number ...  Thanks

## 2013-04-03 NOTE — Telephone Encounter (Signed)
This sounds like an issue that is best dealt with by PCP to refer to Ortho if necessary.  I would prefer not using OTC Advil -- Tylenol instead.

## 2013-04-05 ENCOUNTER — Encounter (HOSPITAL_COMMUNITY): Payer: 59

## 2013-04-05 ENCOUNTER — Encounter (HOSPITAL_COMMUNITY): Payer: Self-pay

## 2013-04-05 NOTE — Telephone Encounter (Signed)
Left message on work and home number with information provided by Dr Ellyn Hack

## 2013-04-08 ENCOUNTER — Encounter (HOSPITAL_COMMUNITY): Payer: 59

## 2013-04-08 ENCOUNTER — Encounter (HOSPITAL_COMMUNITY): Payer: Self-pay

## 2013-04-08 DIAGNOSIS — Z6841 Body Mass Index (BMI) 40.0 and over, adult: Secondary | ICD-10-CM | POA: Insufficient documentation

## 2013-04-08 DIAGNOSIS — I2510 Atherosclerotic heart disease of native coronary artery without angina pectoris: Secondary | ICD-10-CM | POA: Insufficient documentation

## 2013-04-08 DIAGNOSIS — I2119 ST elevation (STEMI) myocardial infarction involving other coronary artery of inferior wall: Secondary | ICD-10-CM | POA: Insufficient documentation

## 2013-04-08 DIAGNOSIS — E785 Hyperlipidemia, unspecified: Secondary | ICD-10-CM | POA: Insufficient documentation

## 2013-04-08 DIAGNOSIS — Z5189 Encounter for other specified aftercare: Secondary | ICD-10-CM | POA: Insufficient documentation

## 2013-04-08 DIAGNOSIS — I10 Essential (primary) hypertension: Secondary | ICD-10-CM | POA: Insufficient documentation

## 2013-04-08 DIAGNOSIS — Z9884 Bariatric surgery status: Secondary | ICD-10-CM | POA: Insufficient documentation

## 2013-04-08 DIAGNOSIS — E669 Obesity, unspecified: Secondary | ICD-10-CM | POA: Insufficient documentation

## 2013-04-10 ENCOUNTER — Encounter (HOSPITAL_COMMUNITY): Payer: 59

## 2013-04-10 ENCOUNTER — Encounter (HOSPITAL_COMMUNITY)
Admission: RE | Admit: 2013-04-10 | Discharge: 2013-04-10 | Disposition: A | Discharge status: Home / Self Care | Payer: Self-pay | Source: Ambulatory Visit | Attending: Cardiology | Admitting: Cardiology

## 2013-04-12 ENCOUNTER — Encounter (HOSPITAL_COMMUNITY)
Admission: RE | Admit: 2013-04-12 | Discharge: 2013-04-12 | Disposition: A | Discharge status: Home / Self Care | Payer: Self-pay | Source: Ambulatory Visit | Attending: Cardiology | Admitting: Cardiology

## 2013-04-12 ENCOUNTER — Encounter (HOSPITAL_COMMUNITY): Payer: 59

## 2013-04-15 ENCOUNTER — Encounter (HOSPITAL_COMMUNITY)
Admission: RE | Admit: 2013-04-15 | Discharge: 2013-04-15 | Disposition: A | Discharge status: Home / Self Care | Payer: 59 | Source: Ambulatory Visit | Attending: Cardiology | Admitting: Cardiology

## 2013-04-15 ENCOUNTER — Encounter (HOSPITAL_COMMUNITY): Payer: 59

## 2013-04-15 NOTE — Progress Notes (Signed)
Pt arrived at cardiac rehab reporting episode of dizziness associated with nausea and diphoresis yesterday. Pt reports she was sitting when event happened. Entire episode lasted approximately 10 minutes. Pt states different from MI symptoms. Pt states with MI she had indigestion with severe diaphoresis.    Pt asymptomatic today and able to exercise without difficulty. Pt states she has appt tomorrow with PCP Dr. Harrington Challenger.  Orthostatic BP-140/90 74 lying, 133/82 84 sitting, 134/84 84 standing.  Pt asymptomatic.  Plan: pt will f/u with PCP as scheduled.  Will schedule with cardiology if deemed necessary by PCP or symptoms persist or worsen.  Understanding verbalized

## 2013-04-17 ENCOUNTER — Encounter (HOSPITAL_COMMUNITY): Payer: 59

## 2013-04-17 ENCOUNTER — Encounter (HOSPITAL_COMMUNITY): Payer: Self-pay

## 2013-04-19 ENCOUNTER — Encounter (HOSPITAL_COMMUNITY): Payer: 59

## 2013-04-19 ENCOUNTER — Encounter (HOSPITAL_COMMUNITY): Payer: Self-pay

## 2013-04-22 ENCOUNTER — Encounter (HOSPITAL_COMMUNITY): Payer: 59

## 2013-04-22 ENCOUNTER — Encounter (HOSPITAL_COMMUNITY): Payer: Self-pay

## 2013-04-24 ENCOUNTER — Encounter (HOSPITAL_COMMUNITY): Payer: 59

## 2013-04-24 ENCOUNTER — Encounter (HOSPITAL_COMMUNITY): Payer: Self-pay

## 2013-04-26 ENCOUNTER — Encounter (HOSPITAL_COMMUNITY): Payer: 59

## 2013-04-26 ENCOUNTER — Encounter (HOSPITAL_COMMUNITY): Payer: Self-pay

## 2013-04-29 ENCOUNTER — Ambulatory Visit: Payer: 59 | Attending: Family Medicine | Admitting: Physical Therapy

## 2013-04-29 ENCOUNTER — Encounter (HOSPITAL_COMMUNITY): Payer: 59

## 2013-04-29 ENCOUNTER — Telehealth (HOSPITAL_COMMUNITY): Payer: Self-pay | Admitting: *Deleted

## 2013-04-29 ENCOUNTER — Encounter (HOSPITAL_COMMUNITY): Admission: RE | Admit: 2013-04-29 | Payer: Self-pay | Source: Ambulatory Visit

## 2013-04-29 DIAGNOSIS — R5381 Other malaise: Secondary | ICD-10-CM | POA: Insufficient documentation

## 2013-04-29 DIAGNOSIS — R293 Abnormal posture: Secondary | ICD-10-CM | POA: Insufficient documentation

## 2013-04-29 DIAGNOSIS — M25559 Pain in unspecified hip: Secondary | ICD-10-CM | POA: Insufficient documentation

## 2013-04-29 DIAGNOSIS — R262 Difficulty in walking, not elsewhere classified: Secondary | ICD-10-CM | POA: Insufficient documentation

## 2013-04-29 DIAGNOSIS — IMO0001 Reserved for inherently not codable concepts without codable children: Secondary | ICD-10-CM | POA: Insufficient documentation

## 2013-05-01 ENCOUNTER — Encounter (HOSPITAL_COMMUNITY): Payer: Self-pay

## 2013-05-01 ENCOUNTER — Encounter (HOSPITAL_COMMUNITY): Payer: 59

## 2013-05-02 ENCOUNTER — Encounter: Payer: 59 | Admitting: Rehabilitation

## 2013-05-03 ENCOUNTER — Encounter (HOSPITAL_COMMUNITY): Payer: 59

## 2013-05-03 ENCOUNTER — Encounter (HOSPITAL_COMMUNITY): Payer: Self-pay

## 2013-05-06 ENCOUNTER — Encounter (HOSPITAL_COMMUNITY): Payer: 59

## 2013-05-06 ENCOUNTER — Encounter (HOSPITAL_COMMUNITY): Payer: Self-pay

## 2013-05-06 ENCOUNTER — Ambulatory Visit: Payer: 59 | Admitting: Physical Therapy

## 2013-05-08 ENCOUNTER — Encounter (HOSPITAL_COMMUNITY): Payer: 59

## 2013-05-08 ENCOUNTER — Encounter (HOSPITAL_COMMUNITY): Payer: Self-pay

## 2013-05-08 ENCOUNTER — Ambulatory Visit: Payer: 59 | Attending: Family Medicine | Admitting: Physical Therapy

## 2013-05-08 DIAGNOSIS — R293 Abnormal posture: Secondary | ICD-10-CM | POA: Insufficient documentation

## 2013-05-08 DIAGNOSIS — M25559 Pain in unspecified hip: Secondary | ICD-10-CM | POA: Insufficient documentation

## 2013-05-08 DIAGNOSIS — R5381 Other malaise: Secondary | ICD-10-CM | POA: Insufficient documentation

## 2013-05-08 DIAGNOSIS — IMO0001 Reserved for inherently not codable concepts without codable children: Secondary | ICD-10-CM | POA: Insufficient documentation

## 2013-05-08 DIAGNOSIS — R262 Difficulty in walking, not elsewhere classified: Secondary | ICD-10-CM | POA: Insufficient documentation

## 2013-05-10 ENCOUNTER — Encounter (HOSPITAL_COMMUNITY): Payer: 59

## 2013-05-10 ENCOUNTER — Encounter (HOSPITAL_COMMUNITY): Payer: Self-pay

## 2013-05-13 ENCOUNTER — Encounter (HOSPITAL_COMMUNITY): Payer: Self-pay

## 2013-05-13 ENCOUNTER — Encounter (HOSPITAL_COMMUNITY): Payer: 59

## 2013-05-13 ENCOUNTER — Encounter: Payer: 59 | Admitting: Rehabilitation

## 2013-05-14 ENCOUNTER — Ambulatory Visit: Payer: 59 | Admitting: Physical Therapy

## 2013-05-15 ENCOUNTER — Encounter (HOSPITAL_COMMUNITY): Payer: 59

## 2013-05-15 ENCOUNTER — Encounter (HOSPITAL_COMMUNITY): Payer: Self-pay

## 2013-05-16 ENCOUNTER — Ambulatory Visit: Payer: 59 | Admitting: Physical Therapy

## 2013-05-16 ENCOUNTER — Encounter: Payer: 59 | Admitting: Physical Therapy

## 2013-05-17 ENCOUNTER — Encounter (HOSPITAL_COMMUNITY): Payer: 59

## 2013-05-17 ENCOUNTER — Encounter (HOSPITAL_COMMUNITY): Payer: Self-pay

## 2013-05-20 ENCOUNTER — Telehealth: Payer: Self-pay | Admitting: Cardiology

## 2013-05-20 ENCOUNTER — Encounter (HOSPITAL_COMMUNITY): Payer: Self-pay

## 2013-05-20 ENCOUNTER — Ambulatory Visit: Payer: 59 | Admitting: Physical Therapy

## 2013-05-20 NOTE — Telephone Encounter (Signed)
Returned call and pt verified x 2.  Pt informed per MD and verbalized understanding.  Agreed w/ plan.

## 2013-05-20 NOTE — Telephone Encounter (Signed)
Returned call and pt verified x 2.  Pt informed message received and Dr. Ellyn Hack will be notified.  Advised to allow a few business days for these requests to allow a response.  Pt stated appt is actually next Monday and informed that should be more than enough time to get a response.  Pt verbalized understanding and agreed w/ plan.   Message forwarded to Dr. Ellyn Hack.

## 2013-05-20 NOTE — Telephone Encounter (Signed)
No need to hold meds or adjust meds for crown surgery -- for root canal, would hold ASA (but not Effient, Brilinta or Plavix)  Leonie Man, MD

## 2013-05-20 NOTE — Telephone Encounter (Signed)
Pt is going to have a crown put on at her dentist at 3 today. She need s to know if she need to do anything about her medicine or any other instructions.

## 2013-05-22 ENCOUNTER — Encounter (HOSPITAL_COMMUNITY): Payer: Self-pay

## 2013-05-22 ENCOUNTER — Ambulatory Visit: Payer: 59 | Admitting: Physical Therapy

## 2013-05-24 ENCOUNTER — Encounter (HOSPITAL_COMMUNITY): Payer: Self-pay

## 2013-05-27 ENCOUNTER — Encounter (HOSPITAL_COMMUNITY): Payer: Self-pay

## 2013-05-29 ENCOUNTER — Ambulatory Visit: Payer: 59 | Admitting: Physical Therapy

## 2013-05-29 ENCOUNTER — Encounter (HOSPITAL_COMMUNITY): Payer: Self-pay

## 2013-05-31 ENCOUNTER — Ambulatory Visit: Payer: 59 | Admitting: Physical Therapy

## 2013-05-31 ENCOUNTER — Encounter (HOSPITAL_COMMUNITY): Payer: Self-pay

## 2013-06-03 ENCOUNTER — Encounter (HOSPITAL_COMMUNITY): Payer: Self-pay

## 2013-06-05 ENCOUNTER — Encounter (HOSPITAL_COMMUNITY): Payer: Self-pay

## 2013-06-07 ENCOUNTER — Encounter (HOSPITAL_COMMUNITY): Payer: Self-pay

## 2013-06-10 ENCOUNTER — Encounter (HOSPITAL_COMMUNITY): Payer: Self-pay

## 2013-06-12 ENCOUNTER — Encounter (HOSPITAL_COMMUNITY): Payer: Self-pay

## 2013-06-14 ENCOUNTER — Encounter (HOSPITAL_COMMUNITY): Payer: Self-pay

## 2013-06-17 ENCOUNTER — Encounter (HOSPITAL_COMMUNITY): Payer: Self-pay

## 2013-06-19 ENCOUNTER — Encounter (HOSPITAL_COMMUNITY): Payer: Self-pay

## 2013-06-21 ENCOUNTER — Encounter (HOSPITAL_COMMUNITY): Payer: Self-pay

## 2013-06-24 ENCOUNTER — Encounter (HOSPITAL_COMMUNITY): Payer: Self-pay

## 2013-06-26 ENCOUNTER — Encounter (HOSPITAL_COMMUNITY): Payer: Self-pay

## 2013-06-28 ENCOUNTER — Encounter (HOSPITAL_COMMUNITY): Payer: Self-pay

## 2013-07-03 ENCOUNTER — Encounter (HOSPITAL_COMMUNITY): Payer: Self-pay

## 2013-07-05 ENCOUNTER — Encounter (HOSPITAL_COMMUNITY): Payer: Self-pay

## 2013-07-08 ENCOUNTER — Encounter (HOSPITAL_COMMUNITY): Payer: Self-pay

## 2013-07-10 ENCOUNTER — Encounter (HOSPITAL_COMMUNITY): Payer: Self-pay

## 2013-07-12 ENCOUNTER — Encounter (HOSPITAL_COMMUNITY): Payer: Self-pay

## 2013-07-15 ENCOUNTER — Encounter (HOSPITAL_COMMUNITY): Payer: Self-pay

## 2013-07-17 ENCOUNTER — Encounter (HOSPITAL_COMMUNITY): Payer: Self-pay

## 2013-07-19 ENCOUNTER — Encounter (HOSPITAL_COMMUNITY): Payer: Self-pay

## 2013-07-22 ENCOUNTER — Encounter (HOSPITAL_COMMUNITY): Payer: Self-pay

## 2013-07-24 ENCOUNTER — Encounter (HOSPITAL_COMMUNITY): Payer: Self-pay

## 2013-07-25 ENCOUNTER — Encounter (INDEPENDENT_AMBULATORY_CARE_PROVIDER_SITE_OTHER): Payer: Self-pay

## 2013-07-25 ENCOUNTER — Ambulatory Visit (INDEPENDENT_AMBULATORY_CARE_PROVIDER_SITE_OTHER): Payer: 59 | Admitting: Physician Assistant

## 2013-07-25 VITALS — BP 126/80 | HR 72 | Temp 98.0°F | Resp 14 | Ht 61.5 in | Wt 245.6 lb

## 2013-07-25 DIAGNOSIS — Z4651 Encounter for fitting and adjustment of gastric lap band: Secondary | ICD-10-CM

## 2013-07-25 NOTE — Progress Notes (Signed)
  HISTORY: Sandra Beasley is a 63 y.o.female who received an 10cm lap-band in July 2006 by Dr. Hassell Done. She comes in with 12 lbs weight gain since her last visit with Dr. Hassell Done in January. She has lost a total of 57 lbs since surgery. She had all fluid removed at her last appointment as she wanted to revert to a vegan diet without any concern of obstruction. Unfortunately, she reports significant hunger and wants the fluid replaced to the band. She has had no further reflux symptoms and is no longer taking Protonix.  VITAL SIGNS: Filed Vitals:   07/25/13 1048  BP: 126/80  Pulse: 72  Temp: 98 F (36.7 C)  Resp: 14    PHYSICAL EXAM: Physical exam reveals a very well-appearing 63 y.o.female in no apparent distress Neurologic: Awake, alert, oriented Psych: Bright affect, conversant Respiratory: Breathing even and unlabored. No stridor or wheezing Abdomen: Soft, nontender, nondistended to palpation. Incisions well-healed. No incisional hernias. Port easily palpated. Extremities: Atraumatic, good range of motion.  ASSESMENT: 63 y.o.  female  s/p 10cm lap-band.   PLAN: The patient's port was accessed with a 20G Huber needle without difficulty. Clear fluid was aspirated and 1.25 mL saline was added to the port to give a total predicted volume of 1.25 mL. The patient was able to swallow water without difficulty following the procedure and was instructed to take clear liquids for the next 24-48 hours and advance slowly as tolerated. I advised her to contact us should she have recurrent GERD symptoms as these may be a sign of over-restriction.

## 2013-07-25 NOTE — Patient Instructions (Signed)

## 2013-07-26 ENCOUNTER — Encounter (HOSPITAL_COMMUNITY): Payer: Self-pay

## 2013-07-29 ENCOUNTER — Encounter (HOSPITAL_COMMUNITY): Payer: Self-pay

## 2013-07-31 ENCOUNTER — Encounter (HOSPITAL_COMMUNITY): Payer: Self-pay

## 2013-08-02 ENCOUNTER — Encounter (HOSPITAL_COMMUNITY): Payer: Self-pay

## 2013-08-05 ENCOUNTER — Encounter (HOSPITAL_COMMUNITY): Payer: Self-pay

## 2013-08-07 ENCOUNTER — Encounter (HOSPITAL_COMMUNITY): Payer: Self-pay

## 2013-08-09 ENCOUNTER — Encounter (HOSPITAL_COMMUNITY): Payer: Self-pay

## 2013-08-12 ENCOUNTER — Encounter (HOSPITAL_COMMUNITY): Payer: Self-pay

## 2013-08-14 ENCOUNTER — Encounter (HOSPITAL_COMMUNITY): Payer: Self-pay

## 2013-08-16 ENCOUNTER — Encounter (HOSPITAL_COMMUNITY): Payer: Self-pay

## 2013-08-19 ENCOUNTER — Encounter (HOSPITAL_COMMUNITY): Payer: Self-pay

## 2013-08-21 ENCOUNTER — Encounter (HOSPITAL_COMMUNITY): Payer: Self-pay

## 2013-08-21 ENCOUNTER — Telehealth: Payer: Self-pay | Admitting: Cardiology

## 2013-08-21 MED ORDER — PANTOPRAZOLE SODIUM 40 MG PO TBEC: 40.0000 mg | DELAYED_RELEASE_TABLET | Freq: Every day | ORAL | Status: DC

## 2013-08-21 MED ORDER — NITROGLYCERIN 0.4 MG SL SUBL: 0.4000 mg | SUBLINGUAL_TABLET | SUBLINGUAL | Status: DC | PRN

## 2013-08-21 MED ORDER — CARVEDILOL 6.25 MG PO TABS: 6.2500 mg | ORAL_TABLET | Freq: Two times a day (BID) | ORAL | Status: DC

## 2013-08-21 MED ORDER — ROSUVASTATIN CALCIUM 40 MG PO TABS: 40.0000 mg | ORAL_TABLET | Freq: Every evening | ORAL | Status: DC

## 2013-08-21 MED ORDER — IRBESARTAN 150 MG PO TABS: 150.0000 mg | ORAL_TABLET | Freq: Every morning | ORAL | Status: DC

## 2013-08-21 MED ORDER — TICAGRELOR 90 MG PO TABS: 90.0000 mg | ORAL_TABLET | Freq: Two times a day (BID) | ORAL | Status: DC

## 2013-08-21 NOTE — Telephone Encounter (Signed)
All cardiac related medications were refilled to patient pharmacy ([facility])

## 2013-08-21 NOTE — Telephone Encounter (Signed)
Patient states that she has just gone on Medicare and has lost her secondary prescription coverage.  She needs all of [facility] [address].

## 2013-08-23 ENCOUNTER — Encounter (HOSPITAL_COMMUNITY): Payer: Self-pay

## 2013-08-26 ENCOUNTER — Encounter (HOSPITAL_COMMUNITY): Payer: Self-pay

## 2013-08-28 ENCOUNTER — Encounter (HOSPITAL_COMMUNITY): Payer: Self-pay

## 2013-08-30 ENCOUNTER — Encounter (HOSPITAL_COMMUNITY): Payer: Self-pay

## 2013-09-02 ENCOUNTER — Encounter (HOSPITAL_COMMUNITY): Payer: Self-pay

## 2013-09-04 ENCOUNTER — Encounter (HOSPITAL_COMMUNITY): Payer: Self-pay

## 2013-09-06 ENCOUNTER — Encounter (HOSPITAL_COMMUNITY): Payer: Self-pay

## 2013-09-09 ENCOUNTER — Encounter (HOSPITAL_COMMUNITY): Payer: Self-pay

## 2013-09-11 ENCOUNTER — Encounter (HOSPITAL_COMMUNITY): Payer: Self-pay

## 2013-09-13 ENCOUNTER — Encounter (HOSPITAL_COMMUNITY): Payer: Self-pay

## 2013-09-16 ENCOUNTER — Encounter (HOSPITAL_COMMUNITY): Payer: Self-pay

## 2013-09-18 ENCOUNTER — Encounter (HOSPITAL_COMMUNITY): Payer: Self-pay

## 2013-09-20 ENCOUNTER — Encounter (HOSPITAL_COMMUNITY): Payer: Self-pay

## 2013-09-23 ENCOUNTER — Encounter (HOSPITAL_COMMUNITY): Payer: Self-pay

## 2013-09-25 ENCOUNTER — Encounter (HOSPITAL_COMMUNITY): Payer: Self-pay

## 2013-09-27 ENCOUNTER — Encounter (HOSPITAL_COMMUNITY): Payer: Self-pay

## 2013-09-30 ENCOUNTER — Encounter (HOSPITAL_COMMUNITY): Payer: Self-pay

## 2013-10-02 ENCOUNTER — Encounter (HOSPITAL_COMMUNITY): Payer: Self-pay

## 2013-10-04 ENCOUNTER — Encounter (HOSPITAL_COMMUNITY): Payer: Self-pay

## 2013-10-07 ENCOUNTER — Encounter (HOSPITAL_COMMUNITY): Payer: Self-pay

## 2013-11-29 ENCOUNTER — Encounter: Payer: Self-pay | Admitting: Cardiology

## 2013-11-29 ENCOUNTER — Ambulatory Visit (INDEPENDENT_AMBULATORY_CARE_PROVIDER_SITE_OTHER): Payer: Medicare HMO | Admitting: Cardiology

## 2013-11-29 VITALS — BP 146/90 | HR 66 | Ht 62.0 in | Wt 235.5 lb

## 2013-11-29 DIAGNOSIS — I2119 ST elevation (STEMI) myocardial infarction involving other coronary artery of inferior wall: Secondary | ICD-10-CM

## 2013-11-29 DIAGNOSIS — I2510 Atherosclerotic heart disease of native coronary artery without angina pectoris: Secondary | ICD-10-CM

## 2013-11-29 DIAGNOSIS — E785 Hyperlipidemia, unspecified: Secondary | ICD-10-CM

## 2013-11-29 DIAGNOSIS — E1159 Type 2 diabetes mellitus with other circulatory complications: Secondary | ICD-10-CM

## 2013-11-29 DIAGNOSIS — Z9861 Coronary angioplasty status: Secondary | ICD-10-CM

## 2013-11-29 DIAGNOSIS — I10 Essential (primary) hypertension: Secondary | ICD-10-CM

## 2013-11-29 MED ORDER — CARVEDILOL 6.25 MG PO TABS: 6.2500 mg | ORAL_TABLET | Freq: Two times a day (BID) | ORAL | Status: DC

## 2013-11-29 NOTE — Assessment & Plan Note (Signed)
Borderline control today. She says at home is much better than it is today she's a little pain today. I am not finding any adjustments for now. Next item probably be to try to titrate the carvedilol, but now her heart rate in the 60s were fine where she is.

## 2013-11-29 NOTE — Assessment & Plan Note (Signed)
Status post lap band. Releases get back into an exercise whatever she can do to help lose the weight.. She also hasn't made a notable adjustment to her diet.

## 2013-11-29 NOTE — Assessment & Plan Note (Signed)
Her RCA was completely re-vascularized and she has residual LAD disease that was not done to be ischemic on stress test. She is on dual antiplatelet therapy, I think at this point she can stop the aspirin. Is also on her and ARB at stable dose. There was confusion as to dosing of her carvedilol -- she has been taking 6.25 mg twice a day, but the prescription said one half tablets twice a day. Since her blood pressure was okay with the current dosing that she is taking we'll just simply make it to 6.25 mg twice a day.Marland Kitchen

## 2013-11-29 NOTE — Progress Notes (Signed)
PCP:  Melinda Crutch, MD  Clinic Note: Chief Complaint  Patient presents with  . Follow-up    No complaints    HPI: Sandra Beasley is a 63 y.o. female with a PMH below who presents today for ~9 month follow-up of CAD-with an Inferior STEMI in late Nov 2014.  Her last visit, she is had a Myoview stress test that showed no ischemia confirming that the LAD and circumflex-OM lesions are not physiologically significant. She was having a lot of GERD symptoms and is concerned with her PPI dosing. And loosened and this alleviated much of her symptoms, however at about 10 pounds. She subsequently then went back and had tightened up again and has lost 10 pounds but is now having her abdominal pain began. He is trying to see how she does off of the Protonix. She is also had significant social stressors noted in the social history section. She completed cardiac rehabilitation, but noted that the type of exercises to do and was making her hips and back very sore.   Past Medical History  Diagnosis Date  . Type 2 diabetes mellitus with circulatory disorder   . Hypertension   . Hyperlipidemia associated with type 2 diabetes mellitus   . Obesity   . STEMI (ST elevation myocardial infarction) 01/02/13    --> PCI; EF 60 and 65%. Gr 1 DD & No regional WMA, Otherwise relatively normal.  . CAD S/P percutaneous coronary angioplasty - DES x2 in RCA 01/02/2013    a. 100% RCA - Xience Xpedition DES 2.25 mm x 15 mm + 2.25 mm x 8 mm overlapping, mod- severe distal LAD and prox OM1 lesions.;b.  Myoview 02/2013: No ischemia or Infarction, Normal EF.  Marland Kitchen GERD (gastroesophageal reflux disease) 03/06/2013    Interval History: Cardiac standpoint, she is doing quite well with no active symptoms. She is limited as far as her activity by hip and back pains.  She just has to walk very slow, but when she does walk, denies any chest tightness, pressure or dyspnea. She denies any PND orthopnea. No edema. No palpitations,  lightheadedness, dizziness, weakness or syncope/near syncope. No TIA/amaurosis fugax symptoms. No melena, hematochezia, hematuria, or epstaxis. No claudication.  ROS: A comprehensive was performed. Review of Systems  Constitutional:       Her weight fluctuates based on how tight her lap band is  HENT: Negative for nosebleeds.   Cardiovascular: Positive for claudication.  Gastrointestinal: Positive for heartburn, abdominal pain and constipation. Negative for blood in stool and melena.  Genitourinary: Negative for dysuria and hematuria.  Neurological: Negative for dizziness, sensory change, speech change, focal weakness, seizures, loss of consciousness and headaches.  Endo/Heme/Allergies: Bruises/bleeds easily.  Psychiatric/Behavioral: Negative for depression.       She has become somewhat "depressed "at the loss of her long-term job. 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  All other systems reviewed and are negative.   Current Outpatient Prescriptions on File Prior to Visit  Medication Sig Dispense Refill  . aspirin 81 MG chewable tablet Chew 1 tablet (81 mg total) by mouth daily.      . Cholecalciferol 1000 UNITS TBDP Take 1,000 Units by mouth every morning.       Marland Kitchen glimepiride (AMARYL) 4 MG tablet Take 4 mg by mouth daily before breakfast.        . irbesartan (AVAPRO) 150 MG tablet Take 1 tablet (150  mg total) by mouth every morning.  30 tablet  5  . metFORMIN (GLUCOPHAGE) 500 MG tablet Take 500 mg by mouth 2 (two) times daily with a meal.       . metroNIDAZOLE (METROCREAM) 0.75 % cream Apply topically 2 (two) times daily. Apply to face      . nitroGLYCERIN (NITROSTAT) 0.4 MG SL tablet Place 1 tablet (0.4 mg total) under the tongue every 5 (five) minutes as needed for chest pain.  25 tablet  12  . omega-3 acid ethyl esters (LOVAZA) 1 G capsule Take 1 g by mouth every morning.       . rosuvastatin  (CRESTOR) 40 MG tablet Take 1 tablet (40 mg total) by mouth every evening.  30 tablet  5  . ticagrelor (BRILINTA) 90 MG TABS tablet Take 1 tablet (90 mg total) by mouth 2 (two) times daily.  60 tablet  5  . pantoprazole (PROTONIX) 40 MG tablet Take 1 tablet (40 mg total) by mouth daily.  30 tablet  11   No current facility-administered medications on file prior to visit.   ALLERGIES REVIEWED IN EPIC -- No change SOCIAL AND FAMILY HISTORY REVIEWED IN EPIC -- her daughter & grandson just moved in with her. She was recently laid off from her Job @ [employer] -- has been picking up part time teaching jobs, now almost full time.  Lots of stress.  Wt Readings from Last 3 Encounters:  11/29/13 235 lb 8 oz (106.822 kg)  07/25/13 245 lb 9.6 oz (111.403 kg)  03/06/13 233 lb 12.8 oz (106.051 kg)    PHYSICAL EXAM BP 146/90  Pulse 66  Ht 5\' 2"  (1.575 m)  Wt 235 lb 8 oz (106.822 kg)  BMI 43.06 kg/m2 GENERAL: Morbidly obese, well developed, in no acute distress; pleasant mood and affect. Well groomed.  HEENT: [place]/AT, EOMI, MMM, anicteric sclera  Neck: no JVD; No cervical lymphadenopathy. No carotid bruit  Cardiac: RRR, no M./R./G. Nondisplaced PMI difficult to palpate due to body habitus. .  Lungs: CTAP,, no wheezing, rhonchi or rales; nonlabored with good air movement.  Ext: no lower extremity edema. 2+ radial and dorsalis pedis pulses.  Skin: warm and dry  Neuro: Grossly normal   Adult ECG Report  Rate: 66 ;  Rhythm: normal sinus rhythm, NSST  Narrative Interpretation: Stable EKG  Recent Labs:  Her PCP just check her labs. She does not know the results yet.    ASSESSMENT / PLAN: CAD S/P percutaneous coronary angioplasty - DES x2 in RCA Her RCA was completely re-vascularized and she has residual LAD disease that was not done to be ischemic on stress test. She is on dual antiplatelet therapy, I think at this point she can stop the aspirin. Is also on her and ARB at stable dose. There  was confusion as to dosing of her carvedilol -- she has been taking 6.25 mg twice a day, but the prescription said one half tablets twice a day. Since her blood pressure was okay with the current dosing that she is taking we'll just simply make it to 6.25 mg twice a day..  Essential hypertension Borderline control today. She says at home is much better than it is today she's a little pain today. I am not finding any adjustments for now. Next item probably be to try to titrate the carvedilol, but now her heart rate in the 60s were fine where she is.  Hyperlipidemia LDL goal <70 Poorly controlled by last set of  labs. She does have that checked to look for the results. She is on a good dose of Crestor. Hopefully she can keep her weight stable bowel also help with her lipid control.  Severe obesity (BMI >= 40) Status post lap band. Releases get back into an exercise whatever she can do to help lose the weight.. She also hasn't made a notable adjustment to her diet.  Type 2 diabetes mellitus with other circulatory complications On oral medications now." Her control is important with her existing LAD and circumflex disease is known to insure that it remains stable.    Orders Placed This Encounter  Procedures  . EKG 12-Lead   Meds ordered this encounter  Medications  . carvedilol (COREG) 6.25 MG tablet    Sig: Take 1 tablet (6.25 mg total) by mouth 2 (two) times daily with a meal.    Dispense:  60 tablet    Refill:  11    Please update your records. Patient should be taking 1 tablet twice daily.   Followup: 6 months   Maxie Slovacek W, M.D., M.S. Interventional Cardiologist   Pager # [phone]

## 2013-11-29 NOTE — Assessment & Plan Note (Signed)
Poorly controlled by last set of labs. She does have that checked to look for the results. She is on a good dose of Crestor. Hopefully she can keep her weight stable bowel also help with her lipid control.

## 2013-11-29 NOTE — Patient Instructions (Signed)
Dr Ellyn Hack has recommended making the following medication changes:  STOP Aspirin  CONTINUE taking your Carvedilol 1 tablet daily  Dr Ellyn Hack recommends that you schedule a follow-up appointment in 6 months. You will receive a reminder letter in the mail two months in advance. If you don't receive a letter, please call our office to schedule the follow-up appointment.

## 2013-11-29 NOTE — Assessment & Plan Note (Signed)
On oral medications now." Her control is important with her existing LAD and circumflex disease is known to insure that it remains stable.

## 2013-12-03 ENCOUNTER — Other Ambulatory Visit: Payer: Self-pay | Admitting: *Deleted

## 2013-12-03 MED ORDER — ROSUVASTATIN CALCIUM 40 MG PO TABS: 40.0000 mg | ORAL_TABLET | Freq: Every evening | ORAL | Status: DC

## 2013-12-03 MED ORDER — TICAGRELOR 90 MG PO TABS: 90.0000 mg | ORAL_TABLET | Freq: Two times a day (BID) | ORAL | Status: DC

## 2013-12-03 MED ORDER — PANTOPRAZOLE SODIUM 40 MG PO TBEC: 40.0000 mg | DELAYED_RELEASE_TABLET | Freq: Every day | ORAL | Status: DC

## 2014-01-14 ENCOUNTER — Other Ambulatory Visit: Payer: Self-pay | Admitting: Dermatology

## 2014-01-16 ENCOUNTER — Encounter (HOSPITAL_COMMUNITY): Payer: Self-pay | Admitting: Cardiology

## 2014-05-14 ENCOUNTER — Telehealth: Payer: Self-pay | Admitting: Cardiology

## 2014-05-14 NOTE — Telephone Encounter (Signed)
RN informed patient. She verbalized understanding.

## 2014-05-14 NOTE — Telephone Encounter (Signed)
OK to hold Brilinta x 5 days pre-procedure. Restart ~2 days post.  [facility]

## 2014-05-14 NOTE — Telephone Encounter (Signed)
Pt need to have a tooth extracted. She is on Brilinta,she wants to know what she needs to do.

## 2014-05-14 NOTE — Telephone Encounter (Signed)
Informed patient will forward to Dr Ellyn Hack. Patient has not made an appointment yet, dentist office is first come first serve.

## 2014-05-19 ENCOUNTER — Telehealth: Payer: Self-pay | Admitting: Cardiology

## 2014-05-19 NOTE — Telephone Encounter (Signed)
Pt need a note stating that she needs to stop her Brllina gor dental work. Please fax to to Dr Hamilton Capri. She dud not have fax number,but the phone number is [phone].

## 2014-05-19 NOTE — Telephone Encounter (Signed)
Will forward to dr harding for approval

## 2014-05-20 NOTE — Telephone Encounter (Signed)
ROUTED LETTER FOR HOLDING MEDICATIONS FOR TOOTH EXTRACTION Left message for patient

## 2014-05-20 NOTE — Telephone Encounter (Signed)
OK 

## 2014-06-25 ENCOUNTER — Telehealth: Payer: Self-pay | Admitting: Cardiology

## 2014-06-25 DIAGNOSIS — Z9861 Coronary angioplasty status: Secondary | ICD-10-CM

## 2014-06-25 DIAGNOSIS — I2510 Atherosclerotic heart disease of native coronary artery without angina pectoris: Secondary | ICD-10-CM

## 2014-06-25 DIAGNOSIS — E785 Hyperlipidemia, unspecified: Secondary | ICD-10-CM

## 2014-06-25 DIAGNOSIS — Z79899 Other long term (current) drug therapy: Secondary | ICD-10-CM

## 2014-06-25 NOTE — Telephone Encounter (Signed)
SPOKE TO PATIENT  CRESTOR  PRICE IS NOW $400 MONTH CHOLESOTROL  MEDICATION ON FORMULARY ARE LOVASTATIN , PRAVASTATIN OR SIMVASTATIN PATIENT AWARE WILL DEFER TO SR HARDING AND CONTACT HER BACK WITH INFORMATION.

## 2014-06-25 NOTE — Telephone Encounter (Signed)
havent seen labs since Nov 2014. Will need to see where she stands with labs to see what we change her to.  [place]

## 2014-06-25 NOTE — Telephone Encounter (Signed)
Mrs. Tappan is calling because she wants to speak about her prescription that was written for her (Crestor) wants to know if she can use a generic because the Crestor has triple for her in price. Please call    Thanks

## 2014-06-26 NOTE — Telephone Encounter (Signed)
Spoke to patient. Information given to patient  Patient can not remember if labs have been done in the past 6 months or not. She will go to labs tomorrow because she ate breakfast this morning.  Will call with results.

## 2014-06-27 ENCOUNTER — Encounter: Payer: Self-pay | Admitting: Cardiology

## 2014-07-01 ENCOUNTER — Telehealth: Payer: Self-pay | Admitting: Cardiology

## 2014-07-01 DIAGNOSIS — Z79899 Other long term (current) drug therapy: Secondary | ICD-10-CM

## 2014-07-01 DIAGNOSIS — E785 Hyperlipidemia, unspecified: Secondary | ICD-10-CM

## 2014-07-01 NOTE — Telephone Encounter (Signed)
PATIENT RETURN CALL. PATIENT HAD LABS DRAWN LAST YEAR 2015.RESULTS HAVE BEEN SCANNED IN TO PATIENT'S CHL CHART LAST RESULT WERE IN 3/10  / 2015 CHOL 119 TRIG 94 HDL-D  51 CAL LDL 49 NON-HDL 68 CHOL/HDL RATIO 2.3  PATIENT WANTED TO KNOW IF THESE RESULT WERE ENOUGH TO MAKE A DECISION ABOUT CHANGING CHOLESTEROL MEDICATION  TO SOMETHING OTHER THAN CRESTOR ( DUE TO COST) ALTERNATIVE  LOVASTATIN ,PRAVASTATIN , OR SIMVASTATIN.  RN INFORMED PATIENT - TO HAVE LAB DRAWN -  PATIENT WOULD LIKE TO WAIT UNTIL DR Vander DECIDES- " LAB IS EXPENSIVE"  PATIENT HAS AN UPCOMING APPOINTMENT ON 08/07/2014 AT 4 PM  WITH DR HARDING. WILL TO DR HARDING.CONTACT PATIENT WITH ANSWER.  PATIENT IS AWARE DR HARDING IS NOT IN THE OFFICE.

## 2014-07-08 NOTE — Telephone Encounter (Signed)
LMTCB

## 2014-07-08 NOTE — Telephone Encounter (Signed)
OK to change to Simvastatin 40 mg.  DH  Will need labs checked in ~ 3 months

## 2014-07-10 MED ORDER — SIMVASTATIN 40 MG PO TABS: 40.0000 mg | ORAL_TABLET | Freq: Every day | ORAL | Status: DC

## 2014-07-10 NOTE — Telephone Encounter (Signed)
Spoke to patient She aware of change in cholesterol medication crestor to simvastatin due to the cost of crestor Will mail lab slip in 3 months to recheck

## 2014-07-15 ENCOUNTER — Telehealth: Payer: Self-pay | Admitting: Cardiology

## 2014-07-15 NOTE — Telephone Encounter (Signed)
In that case - we may be stuck with crestor as the only option left. Don't think Pravachol would be effective.  Crestor is getting ready to become generic.  Leonie Man, MD

## 2014-07-15 NOTE — Telephone Encounter (Signed)
Called and spoke to patient. She had gone from Crestor to Simvastatin d/t cost (price jump in May)  She notes she has had bodyaches, stomach pain/upset, diarrhea. She feels like she has the flu w/o fever.  Notes this started mildly w/in a few days of starting simvastatin, got worse. Advised cutting simvastatin completely for now, call in 1-2 weeks when symptoms improve, would send to Dr. Ellyn Hack to advise on another med. Pt verbalized agreement w/ plan.  She notes she has tried and done poorly on Lipitor in the past d/t similar SE's.

## 2014-07-15 NOTE — Telephone Encounter (Signed)
Pt says the Simvastatin is making her sick,wants to know what she should do?

## 2014-08-04 ENCOUNTER — Other Ambulatory Visit: Payer: Self-pay

## 2014-08-07 ENCOUNTER — Ambulatory Visit (INDEPENDENT_AMBULATORY_CARE_PROVIDER_SITE_OTHER): Payer: Medicare HMO | Admitting: Cardiology

## 2014-08-07 ENCOUNTER — Encounter: Payer: Self-pay | Admitting: Cardiology

## 2014-08-07 VITALS — BP 122/80 | HR 76 | Ht 62.0 in | Wt 249.2 lb

## 2014-08-07 DIAGNOSIS — E785 Hyperlipidemia, unspecified: Secondary | ICD-10-CM

## 2014-08-07 DIAGNOSIS — M79662 Pain in left lower leg: Secondary | ICD-10-CM

## 2014-08-07 DIAGNOSIS — Z9861 Coronary angioplasty status: Secondary | ICD-10-CM

## 2014-08-07 DIAGNOSIS — E1159 Type 2 diabetes mellitus with other circulatory complications: Secondary | ICD-10-CM

## 2014-08-07 DIAGNOSIS — M25472 Effusion, left ankle: Secondary | ICD-10-CM

## 2014-08-07 DIAGNOSIS — I2119 ST elevation (STEMI) myocardial infarction involving other coronary artery of inferior wall: Secondary | ICD-10-CM | POA: Diagnosis not present

## 2014-08-07 DIAGNOSIS — M79605 Pain in left leg: Secondary | ICD-10-CM | POA: Diagnosis not present

## 2014-08-07 DIAGNOSIS — M7989 Other specified soft tissue disorders: Secondary | ICD-10-CM

## 2014-08-07 DIAGNOSIS — I10 Essential (primary) hypertension: Secondary | ICD-10-CM

## 2014-08-07 DIAGNOSIS — I2510 Atherosclerotic heart disease of native coronary artery without angina pectoris: Secondary | ICD-10-CM

## 2014-08-07 NOTE — Patient Instructions (Addendum)
Your physician has requested that you have a lower  extremity venous duplex( lleft leg insuff. And reflux). This test is an ultrasound of the veins in the legs. It looks at venous blood flow that carries blood from the heart to the legs. Allow one hour for a Lower Venous exam. There are no restrictions or special instructions.  Keep leg ice up 20 -30 min at a time and propped up. When sitting.  Simvastatin 20 mg one tablet a week Then 1  tablet  Twice a week for 2 weeks  Then go to every other day   if you cannot tolerate will try other statins the same way. CALL OFFICE (TRY ALL SAT[employer] INSURANCE REQUIRES) Coq 10  300 mg daily - 100 mg  2 weeks then increase to you get to 300 mg.   Continue walking - buy some supportive shoes.  Your physician recommends that you schedule a follow-up appointment in 2 months with extender  Your physician wants you to follow-up in 6-7 months dr harding.  You will receive a reminder letter in the mail two months in advance. If you don't receive a letter, please call our office to schedule the follow-up appointment.

## 2014-08-07 NOTE — Progress Notes (Signed)
PCP:  Melinda Crutch, MD  Clinic Note: Chief Complaint  Patient presents with  . Follow-up    36month followup; no chest pain, shortness of breath-has when walking, edema-has in left foot, no pain in legs, no cramping in legs, no lightheadedness, no dizziness    HPI: Sandra Beasley is a 64 y.o. female with a PMH below who presents today for f/u of STEMI - CAD-PCI.  Last seen in October.  15 lb wgt gain Tired.  Interval History: Pakou presents today really with a major complaint of some swelling in her left ankle. This is all related to her recently going down to [place] where she did a lot of hiking around are clear. She is not wearing regular shoes and a lot of swelling the left ankle and upper foot. Is not in her ankle/leg.  She is concerned that this may be related to her heart is somewhat a friend told her.  Overall she's feeling well but down because she has put back on a lot of weight that she lost. They loosen up her lap band and she came back up some weight. She really notes that she feels tired a lot, but doing fine at that time. She walked around several miles up and down hills and steps without any chest tightness or pressure with rest or exertion. Or she got short of breath when she was pushing herself, not with moderate exertion.  No PND, orthopnea or edema - beyond the swelling in her left ankle today.  No palpitations, lightheadedness, dizziness, weakness or syncope/near syncope. No TIA/amaurosis fugax symptoms. No claudication.  Noted following Sx when converted from Crestor to simvastatin: bodyaches, stomach pain/upset, diarrhea. She feels like she has the flu w/o fever.   She notes she has tried and done poorly on Lipitor in the past d/t similar SE's.        Past Medical History  Diagnosis Date  . Type 2 diabetes mellitus with circulatory disorder   . Hypertension   . Hyperlipidemia associated with type 2 diabetes mellitus   . Obesity   . STEMI (ST  elevation myocardial infarction) 01/02/13    --> PCI; EF 60 and 65%. Gr 1 DD & No regional WMA, Otherwise relatively normal.  . CAD S/P percutaneous coronary angioplasty - DES x2 in RCA 01/02/2013; 02/2013    a. 100% RCA - Xience Xpedition DES 2.25 mm x 15 mm + 2.25 mm x 8 mm overlapping, mod- severe distal LAD and prox OM1 lesions.;b.  Myoview 02/2013: No ischemia or Infarction, Normal EF.  Marland Kitchen GERD (gastroesophageal reflux disease) 03/06/2013    Prior Cardiac Evaluation and Past Surgical History: Past Surgical History  Procedure Laterality Date  . Tonsillectomy  1966  . Laparoscopic hysterectomy  1996  . Laparoscopic gastric banding  2006  . Abdominal hysterectomy    . Cardiac catheterization  01/02/2013    100% mid-distal RCA.; 70-80% proximal OM, distal LAD 80%.  . Coronary angioplasty with stent placement  01/02/2013     PCI-RCA: Xience Xpedition 2.25 mm x 15 mm, 2.25 mm x 8 mm (2.4 mm)  . Transthoracic echocardiogram  01/04/2013    EF 60 and 65%. Grade 1 diastolic dysfunction with no regional wall motion abnormalities. Otherwise relatively normal.  . Left heart catheterization with coronary angiogram N/A 01/02/2013    Procedure: LEFT HEART CATHETERIZATION WITH CORONARY ANGIOGRAM;  Surgeon: Leonie Man, MD;  Location: [facility] CATH LAB;  Service: Cardiovascular;  Laterality: N/A;  ROS: A comprehensive was performed. Review of Systems  Constitutional: Positive for malaise/fatigue (Just not feeling energetic and she had been. Thinks it is related to her weight gain).  HENT: Positive for congestion. Negative for nosebleeds.   Respiratory: Positive for cough (When allergy symptoms). Negative for wheezing.   Cardiovascular: Positive for claudication.       Per history of present illness  Musculoskeletal: Positive for joint pain (Left foot/ankle). Negative for myalgias (Was having mild this would simvastatin. Dose is reduced.).  Neurological: Positive for headaches (occasional headaches).  Negative for dizziness, focal weakness and loss of consciousness.  Endo/Heme/Allergies: Does not bruise/bleed easily.  Psychiatric/Behavioral: Positive for depression (Probably more dysthymia than depression). The patient is nervous/anxious (occasional anxiety spells. Nothing significant).   All other systems reviewed and are negative.   Current Outpatient Prescriptions on File Prior to Visit  Medication Sig Dispense Refill  . carvedilol (COREG) 6.25 MG tablet Take 1 tablet (6.25 mg total) by mouth 2 (two) times daily with a meal. 60 tablet 11  . Cholecalciferol 1000 UNITS TBDP Take 1,000 Units by mouth every morning.     Marland Kitchen glimepiride (AMARYL) 4 MG tablet Take 4 mg by mouth daily before breakfast.      . irbesartan (AVAPRO) 150 MG tablet Take 1 tablet (150 mg total) by mouth every morning. 30 tablet 5  . metFORMIN (GLUCOPHAGE) 500 MG tablet Take 500 mg by mouth 2 (two) times daily with a meal.     . metroNIDAZOLE (METROCREAM) 0.75 % cream Apply topically 2 (two) times daily. Apply to face    . nitroGLYCERIN (NITROSTAT) 0.4 MG SL tablet Place 1 tablet (0.4 mg total) under the tongue every 5 (five) minutes as needed for chest pain. 25 tablet 12  . omega-3 acid ethyl esters (LOVAZA) 1 G capsule Take 1 g by mouth every morning.     . ticagrelor (BRILINTA) 90 MG TABS tablet Take 1 tablet (90 mg total) by mouth 2 (two) times daily. 180 tablet 2   No current facility-administered medications on file prior to visit.  due to insurance issues, she was switched from Crestor to simvastatin -- we have tried  40 mg - but she felt sick, nauseated and myalgias.   Allergies  Allergen Reactions  . Ace Inhibitors Cough    History  Substance Use Topics  . Smoking status: Never Smoker   . Smokeless tobacco: Not on file  . Alcohol Use: No   Family History  Problem Relation Age of Onset  . Adopted: Yes  . Colon cancer Neg Hx   . Stomach cancer Neg Hx   . Cancer Maternal Aunt     breast    Wt  Readings from Last 3 Encounters:  08/07/14 113.036 kg (249 lb 3.2 oz)  11/29/13 106.822 kg (235 lb 8 oz)  07/25/13 111.403 kg (245 lb 9.6 oz)    PHYSICAL EXAM BP 122/80 mmHg  Pulse 76  Ht 5\' 2"  (1.575 m)  Wt 113.036 kg (249 lb 3.2 oz)  BMI 45.57 kg/m2 General appearance: alert, cooperative, appears stated age, no distress and morbidly obese Neck: no adenopathy, no carotid bruit and no JVD Lungs: clear to auscultation bilaterally, normal percussion bilaterally and non-labored Heart: regular rate and rhythm, S1&S2 normal, no murmur, click, rub or gallop; nondisplaced PMI Abdomen: soft, non-tender; bowel sounds normal; no masses,  no organomegaly;significant truncal obesity Extremities: extremities normal, atraumatic, no cyanosis, or uneiform edema; the left foot without a foot the ankle is somewhat swollen  and tender to palpation. Is not have the appearance of general edema. Pulses: 2+ and symmetric;  Skin: normal no notable bruising on his foot/ankle Neurologic: Mental status: Alert, oriented, thought content appropriate Cranial nerves: normal (II-XII grossly intact)    Adult ECG Report  Rate: 76 ;  Rhythm: normal sinus rhythm and Borderline 1AVB, for a progression with low voltage in precordial leads, cannot rule out anterior infarct of age undetermined. Notable Q waves in lead 3 but not significant in 2 and aVF.   Narrative Interpretation: no change from previous EKG  Recent Labs:   Not available  Other studies Reviewed: Additional studies/ records that were reviewed today include:  Review of the above records demonstrates:   ASSESSMENT / PLAN: Problem List Items Addressed This Visit    Ankle swelling    Because she does have some mild venous stasis changes peripherally, we can check low showing venous Dopplers to evaluate for DVT end or venous reflux that could be contributory to her swelling.      Relevant Orders   EKG 12-Lead (Completed)   VAS [place] LOWER EXTREMITY VENOUS  (DVT)   CAD S/P percutaneous coronary angioplasty - DES x2 in RCA (Chronic)    Revascularize RCA with residual disease in the LAD nonischemic on Myoview following discharge from her study. We have stopped Aspirin. Continuing Brilinta. We will look to reduce to 60 mg.. On stable dose of carvedilol and irbesartan. Working on getting her back on statin. Also taking Lovaza      Relevant Orders   EKG 12-Lead (Completed)   VAS [place] LOWER EXTREMITY VENOUS (DVT)   Essential hypertension (Chronic)    Her blood pressure on current meds. No change      Relevant Orders   EKG 12-Lead (Completed)   VAS [place] LOWER EXTREMITY VENOUS (DVT)   Hyperlipidemia LDL goal <70 (Chronic)    She was receiving her dose of Crestor. They have not had this checked recently. We tried to switch to simvastatin which she did not tolerate. At this point I think we need to work toward getting prior authorization for her to get dispensation to use Crestor. We will start with lower dose of simvastatin:  imvastatin 20 mg one tablet a week Then 1  tablet  Twice a week for 2 weeks  Then go to every other day   if you cannot tolerate will try other statins the same way. CALL OFFICE (TRY ALL [employer] INSURANCE REQUIRES); Atorvastatin --> then Pravastain Coq 10  300 mg daily - 100 mg  2 weeks then increase to you get to 300 mg. Will need follow-up labs after being seen by APP in ~2 months       Relevant Orders   EKG 12-Lead (Completed)   VAS [place] LOWER EXTREMITY VENOUS (DVT)   Pain and swelling of left lower leg - Primary    Isolating the left leg swelling and pain in the foot is more related to her walking with poor shoes. We spoke a long time about importance of good support shoes for her to walk. Especially given her weight.  Doppler and using and states and Tylenol for pain as well as ice. I don't think this was cardiac in nature.      Relevant Orders   EKG 12-Lead (Completed)   VAS [place] LOWER EXTREMITY VENOUS (DVT)   Severe  obesity (BMI >= 40) (Chronic)    She is trying to exercise and doing her part in this regard. She is  to get pregnant to touch with her surgeon this evening because it did not been back up again. Hopefully we can get a more gradual loss.  The plan would be to go to a slightly less strict level of tightening.      Relevant Orders   EKG 12-Lead (Completed)   VAS [place] LOWER EXTREMITY VENOUS (DVT)   ST segment elevation myocardial infarction (STEMI) of inferolateral wall, subsequent episode of care (Chronic)    Doing well about any heart failure or angina symptoms - tolerated a pretty decent hike without difficulty. 0 yes on echo with no residual adverse effects of the MI.      Type 2 diabetes mellitus with other circulatory complications (Chronic)      Current medicines are reviewed at length with the patient today. (+/- concerns) simvastatin. See symptoms above. The following changes have been made:   Patient Instructions: Simvastatin 20 mg one tablet a week Then 1  tablet  Twice a week for 2 weeks  Then go to every other day   if you cannot tolerate will try other statins the same way. CALL OFFICE (TRY ALL [employer] INSURANCE REQUIRES) Coq 10  300 mg daily - 100 mg  2 weeks then increase to you get to 300 mg.  LE Venous Dopplers - for DVT & venous reflux  Continue walking -- support hose Ice Ankle   labs/ tests ordered today include:   Orders Placed This Encounter  Procedures  . EKG 12-Lead   Meds ordered this encounter  Medications  . pantoprazole (PROTONIX) 40 MG tablet    Sig: Take 20 mg by mouth daily.    Your physician recommends that you schedule a follow-up appointment in 2 months with extender  Your physician wants you to follow-up in 6-7 months dr harding.    Leonie Man, M.D., M.S. Interventional Cardiologist   Pager # [phone]

## 2014-08-09 ENCOUNTER — Encounter: Payer: Self-pay | Admitting: Cardiology

## 2014-08-09 DIAGNOSIS — M7989 Other specified soft tissue disorders: Secondary | ICD-10-CM | POA: Insufficient documentation

## 2014-08-09 DIAGNOSIS — M25473 Effusion, unspecified ankle: Secondary | ICD-10-CM | POA: Insufficient documentation

## 2014-08-09 DIAGNOSIS — M25471 Effusion, right ankle: Secondary | ICD-10-CM | POA: Insufficient documentation

## 2014-08-09 DIAGNOSIS — M79662 Pain in left lower leg: Secondary | ICD-10-CM | POA: Insufficient documentation

## 2014-08-09 NOTE — Assessment & Plan Note (Signed)
She is trying to exercise and doing her part in this regard. She is to get pregnant to touch with her surgeon this evening because it did not been back up again. Hopefully we can get a more gradual loss.  The plan would be to go to a slightly less strict level of tightening.

## 2014-08-09 NOTE — Assessment & Plan Note (Signed)
Revascularize RCA with residual disease in the LAD nonischemic on Myoview following discharge from her study. We have stopped Aspirin. Continuing Brilinta. We will look to reduce to 60 mg.. On stable dose of carvedilol and irbesartan. Working on getting her back on statin. Also taking Lovaza

## 2014-08-09 NOTE — Assessment & Plan Note (Signed)
Isolating the left leg swelling and pain in the foot is more related to her walking with poor shoes. We spoke a long time about importance of good support shoes for her to walk. Especially given her weight.  Doppler and using and states and Tylenol for pain as well as ice. I don't think this was cardiac in nature.

## 2014-08-09 NOTE — Assessment & Plan Note (Signed)
Because she does have some mild venous stasis changes peripherally, we can check low showing venous Dopplers to evaluate for DVT end or venous reflux that could be contributory to her swelling.

## 2014-08-09 NOTE — Assessment & Plan Note (Signed)
Doing well about any heart failure or angina symptoms - tolerated a pretty decent hike without difficulty. 0 yes on echo with no residual adverse effects of the MI.

## 2014-08-09 NOTE — Assessment & Plan Note (Signed)
She was receiving her dose of Crestor. They have not had this checked recently. We tried to switch to simvastatin which she did not tolerate. At this point I think we need to work toward getting prior authorization for her to get dispensation to use Crestor. We will start with lower dose of simvastatin:  imvastatin 20 mg one tablet a week Then 1  tablet  Twice a week for 2 weeks  Then go to every other day   if you cannot tolerate will try other statins the same way. CALL OFFICE (TRY ALL [employer] INSURANCE REQUIRES); Atorvastatin --> then Pravastain Coq 10  300 mg daily - 100 mg  2 weeks then increase to you get to 300 mg. Will need follow-up labs after being seen by APP in ~2 months

## 2014-08-09 NOTE — Assessment & Plan Note (Signed)
Her blood pressure on current meds. No change

## 2014-08-13 ENCOUNTER — Telehealth (HOSPITAL_COMMUNITY): Payer: Self-pay | Admitting: *Deleted

## 2014-08-21 ENCOUNTER — Telehealth (HOSPITAL_COMMUNITY): Payer: Self-pay | Admitting: *Deleted

## 2014-08-25 ENCOUNTER — Telehealth (HOSPITAL_COMMUNITY): Payer: Self-pay | Admitting: *Deleted

## 2014-09-03 ENCOUNTER — Ambulatory Visit (HOSPITAL_COMMUNITY)
Admission: RE | Admit: 2014-09-03 | Discharge: 2014-09-03 | Disposition: A | Discharge status: Home / Self Care | Payer: Medicare HMO | Source: Ambulatory Visit | Attending: Cardiovascular Disease | Admitting: Cardiovascular Disease

## 2014-09-03 ENCOUNTER — Other Ambulatory Visit: Payer: Self-pay | Admitting: Cardiology

## 2014-09-03 DIAGNOSIS — E785 Hyperlipidemia, unspecified: Secondary | ICD-10-CM | POA: Diagnosis not present

## 2014-09-03 DIAGNOSIS — M7989 Other specified soft tissue disorders: Secondary | ICD-10-CM | POA: Diagnosis not present

## 2014-09-03 DIAGNOSIS — I10 Essential (primary) hypertension: Secondary | ICD-10-CM | POA: Insufficient documentation

## 2014-09-03 DIAGNOSIS — M79605 Pain in left leg: Secondary | ICD-10-CM | POA: Diagnosis not present

## 2014-09-03 DIAGNOSIS — Z9861 Coronary angioplasty status: Secondary | ICD-10-CM

## 2014-09-03 DIAGNOSIS — M25472 Effusion, left ankle: Secondary | ICD-10-CM | POA: Diagnosis not present

## 2014-09-03 DIAGNOSIS — M79662 Pain in left lower leg: Secondary | ICD-10-CM

## 2014-09-03 DIAGNOSIS — I2510 Atherosclerotic heart disease of native coronary artery without angina pectoris: Secondary | ICD-10-CM

## 2014-09-03 NOTE — Telephone Encounter (Signed)
REFILL 

## 2014-09-05 ENCOUNTER — Telehealth: Payer: Self-pay | Admitting: Cardiology

## 2014-09-05 NOTE — Telephone Encounter (Signed)
Pt would like to know if you have any discount cards or coupons for Brilinta please.

## 2014-09-05 NOTE — Telephone Encounter (Signed)
LM for patient that discount cards and/or coupons from manufacturer do not work with Medicare - she can request samples if needed.

## 2014-09-09 ENCOUNTER — Other Ambulatory Visit: Payer: Self-pay | Admitting: Cardiology

## 2014-09-09 ENCOUNTER — Telehealth: Payer: Self-pay | Admitting: Cardiology

## 2014-09-09 NOTE — Telephone Encounter (Signed)
Sandra Beasley is calling to get some samples of Brilinta. Please call   Thanks

## 2014-09-09 NOTE — Telephone Encounter (Signed)
Patient calling the office for samples of medication:   1.  What medication and dosage are you requesting samples for?Brilinta-would like a 30 day supply please if possible  2.  Are you currently out of this medication? Completely out of medicine  3. Are you requesting samples to get you through until a mail order prescription arrives?no

## 2014-09-09 NOTE — Telephone Encounter (Signed)
Spoke with pt, aware we currently do not have any samples. Number to the [address] office given to the pt to see if they have samples. Pt has a script but can not afford it.

## 2014-09-11 ENCOUNTER — Telehealth: Payer: Self-pay | Admitting: Cardiology

## 2014-09-11 NOTE — Telephone Encounter (Signed)
Sandra Beasley is returning a call from [place] . Please call   Thanks

## 2014-09-11 NOTE — Telephone Encounter (Signed)
Talked with patient and let her know that Altha Harm our onsite pharmacist is taking care of getting discounts on her britlanta and crestor

## 2014-09-12 ENCOUNTER — Telehealth: Payer: Self-pay | Admitting: *Deleted

## 2014-09-12 NOTE — Telephone Encounter (Signed)
Spoke to patient. Result given . Verbalized understanding  

## 2014-09-12 NOTE — Telephone Encounter (Signed)
-----   Message from Leonie Man, MD sent at 09/10/2014  8:26 AM EDT ----- No evidence of DVT or superficial thrombus.  Also no notabe venous incompetence. Good news.  Protivin

## 2014-09-16 ENCOUNTER — Other Ambulatory Visit: Payer: Self-pay | Admitting: *Deleted

## 2014-09-16 MED ORDER — TICAGRELOR 90 MG PO TABS: 90.0000 mg | ORAL_TABLET | Freq: Two times a day (BID) | ORAL | Status: DC

## 2014-10-16 ENCOUNTER — Telehealth: Payer: Self-pay | Admitting: *Deleted

## 2014-10-16 DIAGNOSIS — E785 Hyperlipidemia, unspecified: Secondary | ICD-10-CM

## 2014-10-16 DIAGNOSIS — Z79899 Other long term (current) drug therapy: Secondary | ICD-10-CM

## 2014-10-16 NOTE — Telephone Encounter (Signed)
MAILED LETTER AND LABSLIP 

## 2014-10-16 NOTE — Telephone Encounter (Signed)
-----   Message from Raiford Simmonds, RN sent at 07/10/2014  2:13 PM EDT ----- Need hepatic level, lipid In 3 month   (look 07/10/14 note)

## 2014-10-30 ENCOUNTER — Telehealth: Payer: Self-pay | Admitting: Pharmacist Clinician (PhC)/ Clinical Pharmacy Specialist

## 2014-10-30 MED ORDER — TICAGRELOR 60 MG PO TABS: 60.0000 mg | ORAL_TABLET | Freq: Two times a day (BID) | ORAL | Status: DC

## 2014-10-30 NOTE — Telephone Encounter (Signed)
-----   Message from Leonie Man, MD sent at 10/29/2014  6:24 PM EDT ----- Definitely  DH ----- Message -----    From: Rockne Menghini, RPH-CPP    Sent: 10/29/2014   2:53 PM      To: Leonie Man, MD  Can we decrease her Brilinta to 60 mg bid?  She's been > 1 yr since her stents. You made reference in your last note about possibly decreasing dose.  Pt has applied for assistance with [place] for it and was denied for now.  We have lots of samples of the 60 mg!  Erasmo Downer

## 2014-10-30 NOTE — Telephone Encounter (Signed)
Patient came into office to pick up samples for Brilinta.  Explained the dose decrease to 60 mg bid and gave 6 wks of samples.  Pt voiced understanding

## 2014-11-05 ENCOUNTER — Encounter: Payer: Self-pay | Admitting: Cardiology

## 2014-11-28 ENCOUNTER — Other Ambulatory Visit: Payer: Self-pay | Admitting: Cardiology

## 2014-12-09 DIAGNOSIS — N289 Disorder of kidney and ureter, unspecified: Secondary | ICD-10-CM | POA: Diagnosis not present

## 2014-12-21 ENCOUNTER — Other Ambulatory Visit: Payer: Self-pay | Admitting: Cardiology

## 2015-03-11 ENCOUNTER — Telehealth: Payer: Self-pay | Admitting: Cardiology

## 2015-03-11 NOTE — Telephone Encounter (Signed)
New message    Patient calling the office for samples of medication:   1.  What medication and dosage are you requesting samples for?  brilinta 60mg   2.  Are you currently out of this medication? Almost out AND, please send a presc to [facility] at [address]; 30 day supply

## 2015-03-13 MED ORDER — TICAGRELOR 60 MG PO TABS: 60.0000 mg | ORAL_TABLET | Freq: Two times a day (BID) | ORAL | Status: DC

## 2015-03-13 NOTE — Telephone Encounter (Signed)
Samples provided, pt informed via VM message.

## 2015-03-17 ENCOUNTER — Telehealth: Payer: Self-pay | Admitting: Cardiology

## 2015-03-17 NOTE — Telephone Encounter (Signed)
°  Patient calling the office for samples of medication:   1.  What medication and dosage are you requesting samples for? Brilinta  2.  Are you currently out of this medication? Yes

## 2015-03-17 NOTE — Telephone Encounter (Signed)
Medication Samples have been provided to the patient.  Drug name: Brilinta 60 mg  Qty: # 42 tablets  LOT: [id number]  Exp.Date: 05/2016/ 04/2016  The patient has been instructed regarding the correct time, dose, and frequency of taking this medication, including desired effects and most common side effects.   Called patient.  Samples at front desk  Lund Milderd Manocchio 3:07 PM 03/17/2015

## 2015-04-21 ENCOUNTER — Other Ambulatory Visit: Payer: Self-pay | Admitting: Cardiology

## 2015-04-21 NOTE — Telephone Encounter (Signed)
REFILL 

## 2015-05-07 DIAGNOSIS — I252 Old myocardial infarction: Secondary | ICD-10-CM | POA: Diagnosis not present

## 2015-05-07 DIAGNOSIS — Z7984 Long term (current) use of oral hypoglycemic drugs: Secondary | ICD-10-CM | POA: Diagnosis not present

## 2015-05-07 DIAGNOSIS — E1165 Type 2 diabetes mellitus with hyperglycemia: Secondary | ICD-10-CM | POA: Diagnosis not present

## 2015-05-12 ENCOUNTER — Ambulatory Visit (INDEPENDENT_AMBULATORY_CARE_PROVIDER_SITE_OTHER): Payer: Medicare HMO | Admitting: Physician Assistant

## 2015-05-12 ENCOUNTER — Telehealth: Payer: Self-pay

## 2015-05-12 ENCOUNTER — Encounter: Payer: Self-pay | Admitting: Physician Assistant

## 2015-05-12 VITALS — BP 130/84 | HR 58 | Ht 62.0 in | Wt 237.0 lb

## 2015-05-12 DIAGNOSIS — E669 Obesity, unspecified: Secondary | ICD-10-CM

## 2015-05-12 DIAGNOSIS — E785 Hyperlipidemia, unspecified: Secondary | ICD-10-CM | POA: Diagnosis not present

## 2015-05-12 DIAGNOSIS — I2510 Atherosclerotic heart disease of native coronary artery without angina pectoris: Secondary | ICD-10-CM

## 2015-05-12 DIAGNOSIS — Z9861 Coronary angioplasty status: Secondary | ICD-10-CM

## 2015-05-12 DIAGNOSIS — I10 Essential (primary) hypertension: Secondary | ICD-10-CM

## 2015-05-12 MED ORDER — EZETIMIBE 10 MG PO TABS: 10.0000 mg | ORAL_TABLET | Freq: Every day | ORAL | Status: DC

## 2015-05-12 NOTE — Telephone Encounter (Signed)
Requested copies of pt 's recent labs ordered by pt's pcp Dr.Ross. Adv them to fax them over attn: [facility]

## 2015-05-12 NOTE — Progress Notes (Signed)
Patient ID: Sandra Beasley, female   DOB: 1950-05-09, 65 y.o.   MRN: [id number]    Date:  05/12/2015   ID:  Sandra Beasley, DOB 1950/03/15, MRN [id number]  PCP:   Sandra Crutch, MD  Primary Cardiologist:  Sandra Beasley  Chief Complaint  Patient presents with  . Follow-up    Dr. Harrington Beasley took pt off of simvastatin due to some kidney damage     History of Present Illness: Sandra Beasley is a 65 y.o. female history of coronary artery disease and ST EMI November 2014 100% occluded RCA which was stented.  She also had moderate to severe distal LAD and proximal OM1 lesions to the stress test January 2015 which is nonischemic  She presents for 6 on follow-up.  She reports her daughter had genetic testing done which  Indicated she had a hard time at the embolism metabolizes and statin. The patient has been having muscle aches and pains since being on a statin. This was discontinued last September and aches and pains are resolved. Her basic metabolic panel last September also revealed she had chronic kidney disease. This is mildly improved in December and she just had labs drawn at her primary care provider's office last week which we do not have the results of yet.  She does a Silver sneakers and yoga 3 times per week. She's had taken her neck recently which is worse with yawning and some rotation. She otherwise denies nausea, vomiting, fever, chest pain, shortness of breath, orthopnea, dizziness, PND, cough, congestion, abdominal pain, hematochezia, melena, lower extremity edema, claudication.  Wt Readings from Last 3 Encounters:  05/12/15 237 lb (107.502 kg)  08/07/14 249 lb 3.2 oz (113.036 kg)  11/29/13 235 lb 8 oz (106.822 kg)    Lipid Panel     Component Value Date/Time   CHOL 228* 01/03/2013 0245   TRIG 108 01/03/2013 0245   HDL 62 01/03/2013 0245   CHOLHDL 3.7 01/03/2013 0245   VLDL 22 01/03/2013 0245   LDLCALC 144* 01/03/2013 0245      Past Medical History  Diagnosis Date  . Type 2 diabetes  mellitus with circulatory disorder ([place])   . Hypertension   . Hyperlipidemia associated with type 2 diabetes mellitus ([place])   . Obesity   . STEMI (ST elevation myocardial infarction) ([place]) 01/02/13    --> PCI; EF 60 and 65%. Gr 1 DD & No regional WMA, Otherwise relatively normal.  . CAD S/P percutaneous coronary angioplasty - DES x2 in RCA 01/02/2013; 02/2013    a. 100% RCA - Xience Xpedition DES 2.25 mm x 15 mm + 2.25 mm x 8 mm overlapping, mod- severe distal LAD and prox OM1 lesions.;b.  Myoview 02/2013: No ischemia or Infarction, Normal EF.  Marland Kitchen GERD (gastroesophageal reflux disease) 03/06/2013    Current Outpatient Prescriptions  Medication Sig Dispense Refill  . carvedilol (COREG) 6.25 MG tablet Take 1 tablet (6.25 mg total) by mouth 2 (two) times daily with a meal. Keep ov. 60 tablet 0  . Cholecalciferol 1000 UNITS TBDP Take 1,000 Units by mouth every morning.     Marland Kitchen glimepiride (AMARYL) 4 MG tablet Take 4 mg by mouth daily before breakfast.      . irbesartan (AVAPRO) 150 MG tablet Take 1 tablet (150 mg total) by mouth every morning. 30 tablet 5  . metFORMIN (GLUCOPHAGE) 500 MG tablet Take 500 mg by mouth 2 (two) times daily with a meal.     . metroNIDAZOLE (METROCREAM) 0.75 % cream  Apply topically 2 (two) times daily. Apply to face    . nitroGLYCERIN (NITROSTAT) 0.4 MG SL tablet Place 1 tablet (0.4 mg total) under the tongue every 5 (five) minutes as needed for chest pain. 25 tablet 12  . omega-3 acid ethyl esters (LOVAZA) 1 G capsule Take 1 g by mouth every morning.     . pantoprazole (PROTONIX) 40 MG tablet TAKE 1 TABLET (40 MG TOTAL) BY MOUTH DAILY. 90 tablet 2  . ticagrelor (BRILINTA) 60 MG TABS tablet Take 1 tablet (60 mg total) by mouth 2 (two) times daily. 70 tablet 0  . ezetimibe (ZETIA) 10 MG tablet Take 1 tablet (10 mg total) by mouth daily. 30 tablet 11   No current facility-administered medications for this visit.    Allergies:    Allergies  Allergen Reactions  . Ace  Inhibitors Cough    Social History:  The patient  reports that she has never smoked. She does not have any smokeless tobacco history on file. She reports that she does not drink alcohol or use illicit drugs.   Family history:   Family History  Problem Relation Age of Onset  . Adopted: Yes  . Colon cancer Neg Hx   . Stomach cancer Neg Hx   . Cancer Maternal Aunt     breast    ROS:  Please see the history of present illness.  All other systems reviewed and negative.   PHYSICAL EXAM: VS:  BP 130/84 mmHg  Pulse 58  Ht 5\' 2"  (1.575 m)  Wt 237 lb (107.502 kg)  BMI 43.34 kg/m2 Morbidly obese, well developed, in no acute distress HEENT: Pupils are equal round react to light accommodation extraocular movements are intact.  Neck: no JVDNo cervical lymphadenopathy. Cardiac: Regular rate and rhythm without murmurs rubs or gallops. Lungs:  clear to auscultation bilaterally, no wheezing, rhonchi or rales Abd: soft, nontender, positive bowel sounds all quadrants Ext: no lower extremity edema.  2+ radial and dorsalis pedis pulses. Skin: warm and dry Neuro:  Grossly normal  EKG.  Sinus bradycardia rate 50 bpm first degree AV block.   ASSESSMENT AND PLAN:  Problem List Items Addressed This Visit    Obesity (Chronic)   Hyperlipidemia LDL goal <70 (Chronic)   Relevant Medications   ezetimibe (ZETIA) 10 MG tablet   Essential hypertension - Primary (Chronic)   Relevant Medications   ezetimibe (ZETIA) 10 MG tablet   Other Relevant Orders   EKG 12-Lead   CAD S/P percutaneous coronary angioplasty - DES x2 in RCA (Chronic)   Relevant Medications   ezetimibe (ZETIA) 10 MG tablet   Other Relevant Orders   EKG 12-Lead     Sandra Beasley appears to be doing well. She seems really concerned about her kidney function. We'll review those labs when we get them. May need to discontinue losartan and switch to an alternative blood pressure medication.  She denies any chest pain or shortness of breath.  She continues on Brilinta 60 mg twice a day.  Aches and pains she was having all over resolved after stopping her statin. We'll try her on Zetia 10 mg. We'll also review the lipid panel once we get the results of her primary care provider.  We talked about low carb diet. She says her weakness is ice cream in the evening. She will continue exercising with silver sneakers and doing yoga.

## 2015-05-12 NOTE — Patient Instructions (Signed)
Medication Instructions:  Your physician has recommended you make the following change in your medication:  START Zetia 10mg  daily. An Rx has been sent to your pharmacy  Labwork: None ordered  Testing/Procedures: None ordered  Follow-Up: Your physician wants you to follow-up in: 6 months with Dr.Harding You will receive a reminder letter in the mail two months in advance. If you don't receive a letter, please call our office to schedule the follow-up appointment.   Any Other Special Instructions Will Be Listed Below (If Applicable).     If you need a refill on your cardiac medications before your next appointment, please call your pharmacy.

## 2015-05-18 DIAGNOSIS — N184 Chronic kidney disease, stage 4 (severe): Secondary | ICD-10-CM | POA: Diagnosis not present

## 2015-05-18 DIAGNOSIS — R944 Abnormal results of kidney function studies: Secondary | ICD-10-CM | POA: Diagnosis not present

## 2015-05-26 ENCOUNTER — Other Ambulatory Visit: Payer: Self-pay | Admitting: *Deleted

## 2015-05-27 MED ORDER — TICAGRELOR 60 MG PO TABS: 60.0000 mg | ORAL_TABLET | Freq: Two times a day (BID) | ORAL | Status: DC

## 2015-06-04 ENCOUNTER — Other Ambulatory Visit: Payer: Self-pay | Admitting: Cardiology

## 2015-06-04 NOTE — Telephone Encounter (Signed)
Rx refill sent to pharmacy. 

## 2015-06-26 DIAGNOSIS — Z6841 Body Mass Index (BMI) 40.0 and over, adult: Secondary | ICD-10-CM | POA: Diagnosis not present

## 2015-06-26 DIAGNOSIS — K219 Gastro-esophageal reflux disease without esophagitis: Secondary | ICD-10-CM | POA: Diagnosis not present

## 2015-06-26 DIAGNOSIS — I259 Chronic ischemic heart disease, unspecified: Secondary | ICD-10-CM | POA: Diagnosis not present

## 2015-06-26 DIAGNOSIS — I252 Old myocardial infarction: Secondary | ICD-10-CM | POA: Diagnosis not present

## 2015-06-26 DIAGNOSIS — I10 Essential (primary) hypertension: Secondary | ICD-10-CM | POA: Diagnosis not present

## 2015-06-26 DIAGNOSIS — E119 Type 2 diabetes mellitus without complications: Secondary | ICD-10-CM | POA: Diagnosis not present

## 2015-06-26 DIAGNOSIS — Z0000 Encounter for general adult medical examination without abnormal findings: Secondary | ICD-10-CM | POA: Diagnosis not present

## 2015-07-20 ENCOUNTER — Telehealth: Payer: Self-pay | Admitting: Cardiology

## 2015-07-20 MED ORDER — TICAGRELOR 60 MG PO TABS: 60.0000 mg | ORAL_TABLET | Freq: Two times a day (BID) | ORAL | Status: DC

## 2015-07-20 NOTE — Telephone Encounter (Signed)
Patient calling the office for samples of medication: ° ° °1.  What medication and dosage are you requesting samples for? Brilinta ° °2.  Are you currently out of this medication?  3 left ° ° ° °

## 2015-07-20 NOTE — Telephone Encounter (Signed)
Spoke to patient Sample available  Patient states she can not afford medication, and tried patient assistance - [place]

## 2015-08-24 ENCOUNTER — Other Ambulatory Visit: Payer: Self-pay

## 2015-08-24 MED ORDER — PANTOPRAZOLE SODIUM 40 MG PO TBEC: DELAYED_RELEASE_TABLET | ORAL | Status: DC

## 2015-09-25 ENCOUNTER — Other Ambulatory Visit: Payer: Self-pay | Admitting: Cardiology

## 2015-10-16 ENCOUNTER — Telehealth: Payer: Self-pay | Admitting: Cardiology

## 2015-10-16 NOTE — Telephone Encounter (Signed)
New message   Patient calling the office for samples of medication:   1.  What medication and dosage are you requesting samples for? berlinta   2.  Are you currently out of this medication? 5 days

## 2015-10-20 ENCOUNTER — Encounter: Payer: Self-pay | Admitting: Gastroenterology

## 2015-10-23 ENCOUNTER — Telehealth: Payer: Self-pay | Admitting: Cardiology

## 2015-10-23 NOTE — Telephone Encounter (Signed)
Medication Samples have been provided to the patient.  Drug name: BrilintaQty: 56LOT: [id number].Date: 7/18  The patient has been instructed regarding the correct time, dose, and frequency of taking this medication, including desired effects and most common side effects.

## 2015-10-23 NOTE — Telephone Encounter (Signed)
New Message  Patient calling the office for samples of medication:   1.  What medication and dosage are you requesting samples for? Brilinta 60mg    2.  Are you currently out of this medication? Per pt only has two days left

## 2015-10-28 DIAGNOSIS — E119 Type 2 diabetes mellitus without complications: Secondary | ICD-10-CM | POA: Diagnosis not present

## 2015-10-28 DIAGNOSIS — Z7984 Long term (current) use of oral hypoglycemic drugs: Secondary | ICD-10-CM | POA: Diagnosis not present

## 2015-10-28 DIAGNOSIS — H2513 Age-related nuclear cataract, bilateral: Secondary | ICD-10-CM | POA: Diagnosis not present

## 2015-11-19 ENCOUNTER — Other Ambulatory Visit: Payer: Self-pay | Admitting: Cardiology

## 2015-11-19 NOTE — Telephone Encounter (Signed)
Rx request sent to pharmacy.  

## 2015-12-03 DIAGNOSIS — Z8349 Family history of other endocrine, nutritional and metabolic diseases: Secondary | ICD-10-CM | POA: Diagnosis not present

## 2015-12-03 DIAGNOSIS — E1165 Type 2 diabetes mellitus with hyperglycemia: Secondary | ICD-10-CM | POA: Diagnosis not present

## 2015-12-03 DIAGNOSIS — L659 Nonscarring hair loss, unspecified: Secondary | ICD-10-CM | POA: Diagnosis not present

## 2015-12-03 DIAGNOSIS — E559 Vitamin D deficiency, unspecified: Secondary | ICD-10-CM | POA: Diagnosis not present

## 2015-12-03 DIAGNOSIS — I10 Essential (primary) hypertension: Secondary | ICD-10-CM | POA: Diagnosis not present

## 2015-12-03 DIAGNOSIS — Z9884 Bariatric surgery status: Secondary | ICD-10-CM | POA: Diagnosis not present

## 2015-12-03 DIAGNOSIS — G4710 Hypersomnia, unspecified: Secondary | ICD-10-CM | POA: Diagnosis not present

## 2015-12-03 DIAGNOSIS — K912 Postsurgical malabsorption, not elsewhere classified: Secondary | ICD-10-CM | POA: Diagnosis not present

## 2015-12-03 DIAGNOSIS — E782 Mixed hyperlipidemia: Secondary | ICD-10-CM | POA: Diagnosis not present

## 2015-12-03 DIAGNOSIS — Z0000 Encounter for general adult medical examination without abnormal findings: Secondary | ICD-10-CM | POA: Diagnosis not present

## 2015-12-14 ENCOUNTER — Encounter: Payer: Self-pay | Admitting: Cardiology

## 2015-12-14 ENCOUNTER — Ambulatory Visit (INDEPENDENT_AMBULATORY_CARE_PROVIDER_SITE_OTHER): Payer: Medicare HMO | Admitting: Cardiology

## 2015-12-14 DIAGNOSIS — I2119 ST elevation (STEMI) myocardial infarction involving other coronary artery of inferior wall: Secondary | ICD-10-CM

## 2015-12-14 DIAGNOSIS — I10 Essential (primary) hypertension: Secondary | ICD-10-CM

## 2015-12-14 DIAGNOSIS — I2510 Atherosclerotic heart disease of native coronary artery without angina pectoris: Secondary | ICD-10-CM | POA: Diagnosis not present

## 2015-12-14 DIAGNOSIS — E785 Hyperlipidemia, unspecified: Secondary | ICD-10-CM | POA: Diagnosis not present

## 2015-12-14 DIAGNOSIS — Z9861 Coronary angioplasty status: Secondary | ICD-10-CM

## 2015-12-14 MED ORDER — ASPIRIN EC 81 MG PO TBEC: 81.0000 mg | DELAYED_RELEASE_TABLET | Freq: Every day | ORAL | 3 refills | Status: DC

## 2015-12-14 MED ORDER — EZETIMIBE 10 MG PO TABS: 10.0000 mg | ORAL_TABLET | Freq: Every day | ORAL | 3 refills | Status: DC

## 2015-12-14 NOTE — Progress Notes (Signed)
PCP: Melinda Crutch, MD  Clinic Note: Chief Complaint  Patient presents with  . Coronary Artery Disease    History of inferior STEMI with RCA PCI  . Hyperlipidemia    Difficult to manage    HPI: Sandra Beasley is a 65 y.o. female with a PMH below who presents today for f/u of STEMI - CAD-PCI.  Last seen in June 2016 -- noted 15 lb wgt gain, Tired. She also is complaining of ankle edema. Patient Instructions: Simvastatin 20 mg one tablet a week Then 1  tablet  Twice a week for 2 weeks  Then go to every other day   if you cannot tolerate will try other statins the same way. CALL OFFICE (TRY ALL SATINS INSURANCE REQUIRES) Coq 10  300 mg daily - 100 mg  2 weeks then increase to you get to 300 mg.   She saw Tarri Fuller back in April 2017. Was doing relatively well. Aches and pains resolved after statin. Will start her on Zetia - she never started.  No recent studies or hospitalizations.  Interval History: Sandra Beasley presents today pretty much doing fine from a cardiac standpoint. She is not having any active anginal chest discomfort of symptoms. No real dyspnea with rest or exertion. She is obese and deconditioned with limited mobility and, therefore we will get short of breath when she walks around. But she otherwise feels fine. She can walk about 10-15 minutes without really noticing too many symptoms.  Silver sneakers & yoga 3 d/week. - no problems. Silver sneakers is mostly walking, but not true cardio exercise. Despite her exercising, she has put back on weight that she had been trying to lose before. As far as her lipid management, she never did start Zetia as was originally planned. She had been dealing with some other issues and never got around to taking it. Apparently she was taken off medications for concern for renal issues. She apparently and had severe acute on chronic renal insufficiency and medications are being adjusted. I don't really have any labs to work with at this  time.   No PND, orthopnea or edema - No palpitations, lightheadedness, dizziness, weakness or syncope/near syncope. No TIA/amaurosis fugax symptoms. ? Claudication legs get tiered after walking several minutes  Noted following Sx when converted from Crestor to simvastatin: bodyaches, stomach pain/upset, diarrhea. She feels like she has the flu w/o fever.   She notes she has tried and done poorly on Lipitor in the past d/t similar SE's.  Never started Zetia     Past Medical History:  Diagnosis Date  . CAD S/P percutaneous coronary angioplasty - DES x2 in RCA 01/02/2013; 02/2013   a. 100% RCA - Xience Xpedition DES 2.25 mm x 15 mm + 2.25 mm x 8 mm overlapping, mod- severe distal LAD and prox OM1 lesions.;b.  Myoview 02/2013: No ischemia or Infarction, Normal EF.  Marland Kitchen GERD (gastroesophageal reflux disease) 03/06/2013  . Hyperlipidemia associated with type 2 diabetes mellitus ([place])   . Hypertension   . Obesity   . STEMI (ST elevation myocardial infarction) ([place]) 01/02/13   --> PCI; EF 60 and 65%. Gr 1 DD & No regional WMA, Otherwise relatively normal.  . Type 2 diabetes mellitus with circulatory disorder [facility])     Prior Cardiac Evaluation and Past Surgical History: Past Surgical History:  Procedure Laterality Date  . ABDOMINAL HYSTERECTOMY    . CARDIAC CATHETERIZATION  01/02/2013   100% mid-distal RCA.; 70-80% proximal OM, distal LAD 80%.  Marland Kitchen  CORONARY ANGIOPLASTY WITH STENT PLACEMENT  01/02/2013    PCI-RCA: Xience Xpedition 2.25 mm x 15 mm, 2.25 mm x 8 mm (2.4 mm)  . LAPAROSCOPIC GASTRIC BANDING  2006  . LAPAROSCOPIC HYSTERECTOMY  1996  . LEFT HEART CATHETERIZATION WITH CORONARY ANGIOGRAM N/A 01/02/2013   Procedure: LEFT HEART CATHETERIZATION WITH CORONARY ANGIOGRAM;  Surgeon: Leonie Man, MD;  Location: [facility] CATH LAB;  Service: Cardiovascular;  Laterality: N/A;  . TONSILLECTOMY  1966  . TRANSTHORACIC ECHOCARDIOGRAM  01/04/2013   EF 60 and 65%. Grade 1 diastolic dysfunction with no  regional wall motion abnormalities. Otherwise relatively normal.    ROS: A comprehensive was performed. Review of Systems  Constitutional: Positive for malaise/fatigue (Just not feeling energetic and she had been. Thinks it is related to her weight gain). Negative for weight loss (eating ice cream).  HENT: Positive for congestion. Negative for nosebleeds.        Rhinorhea- allergic  Respiratory: Negative for cough (When allergy symptoms), shortness of breath (allergy related) and wheezing.   Cardiovascular: Positive for claudication (legs feel heavy and tired after walking about 10-15 minutes. This also some aching in the evenings.).       Per history of present illness  Gastrointestinal: Positive for abdominal pain (Occasionally related to her lap band). Negative for blood in stool, heartburn and melena.  Genitourinary: Negative for hematuria.  Musculoskeletal: Positive for joint pain (Left foot/ankle). Negative for myalgias (Was having mild this would simvastatin. Dose is reduced.).  Skin:       Hair falling out   Neurological: Negative for dizziness, focal weakness, loss of consciousness and headaches (occasional headaches).  Endo/Heme/Allergies: Positive for environmental allergies. Does not bruise/bleed easily.  Psychiatric/Behavioral: Positive for depression (Probably more dysthymia than depression). The patient is nervous/anxious (occasional anxiety spells. Nothing significant).   All other systems reviewed and are negative.   Prior to Admission medications   Medication Sig Start Date End Date Taking? Authorizing Provider  carvedilol (COREG) 6.25 MG tablet TAKE 1 TABLET (6.25 MG TOTAL) BY MOUTH 2 (TWO) TIMES DAILY WITH A MEAL. KEEP OV. 11/19/15   Leonie Man, MD  Cholecalciferol 1000 UNITS TBDP Take 1,000 Units by mouth every morning.     Historical Provider, MD  ezetimibe (ZETIA) 10 MG tablet Take 1 tablet (10 mg total) by mouth daily. 05/12/15   Brett Canales, PA-C  glimepiride  (AMARYL) 4 MG tablet Take 4 mg by mouth daily before breakfast.      Historical Provider, MD  irbesartan (AVAPRO) 150 MG tablet Take 1 tablet (150 mg total) by mouth every morning. 08/21/13   Leonie Man, MD  metFORMIN (GLUCOPHAGE) 500 MG tablet Take 500 mg by mouth 2 (two) times daily with a meal.  10/01/10   Historical Provider, MD  metroNIDAZOLE (METROCREAM) 0.75 % cream Apply topically 2 (two) times daily. Apply to face    Historical Provider, MD  nitroGLYCERIN (NITROSTAT) 0.4 MG SL tablet Place 1 tablet (0.4 mg total) under the tongue every 5 (five) minutes as needed for chest pain. 08/21/13   Leonie Man, MD  omega-3 acid ethyl esters (LOVAZA) 1 G capsule Take 1 g by mouth every morning.     Historical Provider, MD  pantoprazole (PROTONIX) 40 MG tablet TAKE 1 TABLET (40 MG TOTAL) BY MOUTH DAILY. 08/24/15   Leonie Man, MD  ticagrelor (BRILINTA) 60 MG TABS tablet Take 1 tablet (60 mg total) by mouth 2 (two) times daily. 07/20/15   Leonie Green  Ellyn Hack, MD    Allergies  Allergen Reactions  . Ace Inhibitors Cough    Social History  Substance Use Topics  . Smoking status: Never Smoker  . Smokeless tobacco: Never Used  . Alcohol use No   Family History  Problem Relation Age of Onset  . Adopted: Yes  . Cancer Maternal Aunt     breast  . Colon cancer Neg Hx   . Stomach cancer Neg Hx     Wt Readings from Last 3 Encounters:  12/14/15 111.8 kg (246 lb 6.4 oz)  05/12/15 107.5 kg (237 lb)  08/07/14 113 kg (249 lb 3.2 oz)    PHYSICAL EXAM BP (!) 172/83   Pulse 67   Ht 5\' 2"  (1.575 m)   Wt 111.8 kg (246 lb 6.4 oz)   SpO2 97%   BMI 45.07 kg/m  General appearance: alert, cooperative, appears stated age, no distress and morbidly obese Neck: no adenopathy, no carotid bruit and no JVD Lungs: clear to auscultation bilaterally, normal percussion bilaterally and non-labored Heart: regular rate and rhythm, S1&S2 normal, no murmur, click, rub or gallop; nondisplaced PMI Abdomen: soft,  non-tender; bowel sounds normal; no masses,  no organomegaly;significant truncal obesity Extremities: extremities normal, atraumatic, no cyanosis, or uneiform edema; the left foot without a foot the ankle is somewhat swollen and tender to palpation. Is not have the appearance of general edema. Pulses: 2+ and symmetric;  Skin: normal no notable bruising on his foot/ankle Neurologic: Mental status: Alert, oriented, thought content appropriate Cranial nerves: normal (II-XII grossly intact)    Adult ECG Report Not checked  Recent Labs:   Not available during clinic visit - just checked by PCP  Other studies Reviewed: Additional studies/ records that were reviewed today include:  Review of the above records demonstrates:   ASSESSMENT / PLAN: Problem List Items Addressed This Visit    ST segment elevation myocardial infarction (STEMI) of inferolateral wall, subsequent episode of care [facility]) (Chronic)    Status post PCI the RCA in the setting of an inferior STEMI. No ischemia on follow-up Myoview a couple months later. In fact there was no evidence of any significant infarct. EF is preserved with no regional wall motion modalities on echo. She is not having any further anginal symptoms or heart failure symptoms. She is on beta blocker and ARB as well as Brilinta. Not on statin due to multiple intolerances. Unfortunately she is not able to lose weight -- discussed the importance of her trying to do something physical activity/exercise to try to lose weight.      Relevant Medications   aspirin EC 81 MG tablet   ezetimibe (ZETIA) 10 MG tablet   CAD S/P percutaneous coronary angioplasty - DES x2 in RCA (Chronic)    Now 3 years out from her MI. She would like to stop Brilinta. Her daughter (who practices  [facility] - has suggested that she takes an herbal supplement which may do the same thing). Being 3 years out from a PCI she should be okay stopping Brilinta. .  On beta blocker and ARB.  Non-statin for reasons stated elsewhere.  Plan: Start 81 mg aspirin, and okay to stop Brilinta      Relevant Medications   aspirin EC 81 MG tablet   ezetimibe (ZETIA) 10 MG tablet   Severe obesity (BMI >= 40) ([facility]) (Chronic)    Fourthly, not really helped by her lap band. Every time she loses weight with adjustments, she gets abdominal discomfort and has  to have it adjusted back. She maintains a weight back. She is trying to exercise, but needs to do more. We'll defer further management to PCP and gastric surgeon.      Essential hypertension (Chronic)    Very poorly controlled today. But she said just a few days ago she was at her PCP and blood pressure is 120/80. In light of that, I would discontinue to monitor. We can potentially push the carvedilol dose up a little bit.  With her poorly controlled diabetes, probably the best option would be to titrate up irbesartan if her pressure needs to be elevated. She can go as high as 300 mg a day now that her renal function is more stable.  Otherwise we need to add another medication.      Relevant Medications   aspirin EC 81 MG tablet   ezetimibe (ZETIA) 10 MG tablet   Hyperlipidemia LDL goal <70 (Chronic)    We went to several steps of trying to get her started on some type of regimen. Unfortunately, it's been over a year since I saw her. The plan was for her to go to the lipid clinic, but that did not happen. She has been on several statins and notes intolerance all of them. With very poor control statins she needs to be on something besides omega-3 fatty acids.  Plan: Limited options. We'll start Zetia. She is willing to try now. -- I have a feeling she is given need to follow-up in our lipid clinic. She just is so reluctant to take medications, but I think P2Y12 inhibitors may be her best option. I don't see her losing weight anytime soon.      Relevant Medications   aspirin EC 81 MG tablet   ezetimibe (ZETIA) 10 MG tablet      With  her multiple different intolerances to medications and questioning of certain medications. She had several questions. Over 45 minutes was spent in direct medication with the patient. > 50% of the, the patient was spent with direct consultation, answering questions and providing recommendations.  Current medicines are reviewed at length with the patient today. (+/- concerns) simvastatin. See symptoms above. The following changes have been made:   Patient Instructions: Patient Instructions  STOP BRILINTA  START ASPIRIN 81  MG ONE TABLET DAILY  ZETIA 10 MG ( GENERIC) ONE TABLET DAILY   Your physician wants you to follow-up in: [place] DR Kyran Whitt[place] - 30 MIN.You will receive a reminder letter in the mail two months in advance. If you don't receive a letter, please call our office to schedule the follow-up appointment.   If you need a refill on your cardiac medications before your next appointment, please call your pharmacy.    labs/ tests ordered today include:   No orders of the defined types were placed in this encounter.  Meds ordered this encounter  Medications  . triamcinolone cream (KENALOG) 0.1 %    Sig: Apply 1 application topically as directed.  Marland Kitchen b complex vitamins tablet    Sig: Take 1 tablet by mouth daily.  . TURMERIC CURCUMIN PO    Sig: Take 1 tablet by mouth daily.  Marland Kitchen aspirin EC 81 MG tablet    Sig: Take 1 tablet (81 mg total) by mouth daily.    Dispense:  90 tablet    Refill:  3  . ezetimibe (ZETIA) 10 MG tablet    Sig: Take 1 tablet (10 mg total) by mouth daily.  Dispense:  90 tablet    Refill:  3     Sandra Beasley, M.D., M.S. Interventional Cardiologist   Pager # [phone]   Labs faxed in clinic visit: Results from 12/03/2015 from PCP.  Hemoglobin A1c 9.2 (up from 7.7. Estimated glucose average 2.17.;; Microalbuminuria 0.75. Urine creatinine 145.  CBC: W6.6, H/H 13.0/39.9. Platelets 241. TIBC 398, iron 59, iron saturation 15 (low),  transferrin 284.  sodium 137, potassium 4.1, chloride 102, bicarbonate 27, BUN 15, creatinine 1.21 (down from 1.4 in April), glucose 180, calcium 9.8. Total protein 7.1, albumin 3.9. Total bilirubin 0.8. AST/ALT9/10  TSH 1.35, vitamin D 32.2 (borderline low  Total cholesterol 259, triglycerides 145, HDL 60, LDL 168

## 2015-12-14 NOTE — Patient Instructions (Signed)
STOP BRILINTA  START ASPIRIN 81  MG ONE TABLET DAILY  ZETIA 10 MG ( GENERIC) ONE TABLET DAILY   Your physician wants you to follow-up in: [place] DR [place] - 30 MIN.You will receive a reminder letter in the mail two months in advance. If you don't receive a letter, please call our office to schedule the follow-up appointment.   If you need a refill on your cardiac medications before your next appointment, please call your pharmacy.

## 2015-12-16 ENCOUNTER — Encounter: Payer: Self-pay | Admitting: Cardiology

## 2015-12-16 NOTE — Assessment & Plan Note (Signed)
We went to several steps of trying to get her started on some type of regimen. Unfortunately, it's been over a year since I saw her. The plan was for her to go to the lipid clinic, but that did not happen. She has been on several statins and notes intolerance all of them. With very poor control statins she needs to be on something besides omega-3 fatty acids.  Plan: Limited options. We'll start Zetia. She is willing to try now. -- I have a feeling she is given need to follow-up in our lipid clinic. She just is so reluctant to take medications, but I think P2Y12 inhibitors may be her best option. I don't see her losing weight anytime soon.

## 2015-12-16 NOTE — Assessment & Plan Note (Signed)
Now again worsening control. This clearly has something do with her weight. She is only on oral medications. I think at this rate, she will quite likely require insulin. She has obesity, and hypertension as well as for 1 diabetes making her high risk with metabolic syndrome.

## 2015-12-16 NOTE — Assessment & Plan Note (Signed)
Fourthly, not really helped by her lap band. Every time she loses weight with adjustments, she gets abdominal discomfort and has to have it adjusted back. She maintains a weight back. She is trying to exercise, but needs to do more. We'll defer further management to PCP and gastric surgeon.

## 2015-12-16 NOTE — Assessment & Plan Note (Addendum)
Status post PCI the RCA in the setting of an inferior STEMI. No ischemia on follow-up Myoview a couple months later. In fact there was no evidence of any significant infarct. EF is preserved with no regional wall motion modalities on echo. She is not having any further anginal symptoms or heart failure symptoms. She is on beta blocker and ARB as well as Brilinta. Not on statin due to multiple intolerances. Unfortunately she is not able to lose weight -- discussed the importance of her trying to do something physical activity/exercise to try to lose weight.

## 2015-12-16 NOTE — Assessment & Plan Note (Addendum)
Now 3 years out from her MI. She would like to stop Brilinta. Her daughter (who practices  [employer] - has suggested that she takes an herbal supplement which may do the same thing). Being 3 years out from a PCI she should be okay stopping Brilinta. .  On beta blocker and ARB. Non-statin for reasons stated elsewhere.  Plan: Start 81 mg aspirin, and okay to stop Brilinta

## 2015-12-16 NOTE — Assessment & Plan Note (Signed)
Very poorly controlled today. But she said just a few days ago she was at her PCP and blood pressure is 120/80. In light of that, I would discontinue to monitor. We can potentially push the carvedilol dose up a little bit.  With her poorly controlled diabetes, probably the best option would be to titrate up irbesartan if her pressure needs to be elevated. She can go as high as 300 mg a day now that her renal function is more stable.  Otherwise we need to add another medication.

## 2016-01-18 DIAGNOSIS — E1165 Type 2 diabetes mellitus with hyperglycemia: Secondary | ICD-10-CM | POA: Diagnosis not present

## 2016-01-18 DIAGNOSIS — Z79899 Other long term (current) drug therapy: Secondary | ICD-10-CM | POA: Diagnosis not present

## 2016-01-18 DIAGNOSIS — Z7984 Long term (current) use of oral hypoglycemic drugs: Secondary | ICD-10-CM | POA: Diagnosis not present

## 2016-01-18 DIAGNOSIS — E782 Mixed hyperlipidemia: Secondary | ICD-10-CM | POA: Diagnosis not present

## 2016-02-06 DIAGNOSIS — R69 Illness, unspecified: Secondary | ICD-10-CM | POA: Diagnosis not present

## 2016-03-17 DIAGNOSIS — E1165 Type 2 diabetes mellitus with hyperglycemia: Secondary | ICD-10-CM | POA: Diagnosis not present

## 2016-03-17 DIAGNOSIS — Z7984 Long term (current) use of oral hypoglycemic drugs: Secondary | ICD-10-CM | POA: Diagnosis not present

## 2016-03-17 DIAGNOSIS — Z79899 Other long term (current) drug therapy: Secondary | ICD-10-CM | POA: Diagnosis not present

## 2016-03-17 DIAGNOSIS — E782 Mixed hyperlipidemia: Secondary | ICD-10-CM | POA: Diagnosis not present

## 2016-04-14 DIAGNOSIS — R05 Cough: Secondary | ICD-10-CM | POA: Diagnosis not present

## 2016-04-14 DIAGNOSIS — R69 Illness, unspecified: Secondary | ICD-10-CM | POA: Diagnosis not present

## 2016-04-14 DIAGNOSIS — R0902 Hypoxemia: Secondary | ICD-10-CM | POA: Diagnosis not present

## 2016-04-20 DIAGNOSIS — Z7984 Long term (current) use of oral hypoglycemic drugs: Secondary | ICD-10-CM | POA: Diagnosis not present

## 2016-04-20 DIAGNOSIS — E1165 Type 2 diabetes mellitus with hyperglycemia: Secondary | ICD-10-CM | POA: Diagnosis not present

## 2016-05-12 ENCOUNTER — Other Ambulatory Visit: Payer: Self-pay | Admitting: Cardiology

## 2016-05-12 NOTE — Telephone Encounter (Signed)
REFILL 

## 2016-05-13 DIAGNOSIS — R0902 Hypoxemia: Secondary | ICD-10-CM | POA: Diagnosis not present

## 2016-05-13 DIAGNOSIS — R69 Illness, unspecified: Secondary | ICD-10-CM | POA: Diagnosis not present

## 2016-05-13 DIAGNOSIS — R05 Cough: Secondary | ICD-10-CM | POA: Diagnosis not present

## 2016-05-23 ENCOUNTER — Other Ambulatory Visit: Payer: Self-pay

## 2016-05-23 MED ORDER — CARVEDILOL 6.25 MG PO TABS: 6.2500 mg | ORAL_TABLET | Freq: Two times a day (BID) | ORAL | 2 refills | Status: DC

## 2016-05-23 NOTE — Telephone Encounter (Signed)
Rx(s) sent to pharmacy electronically.  

## 2016-06-22 DIAGNOSIS — R05 Cough: Secondary | ICD-10-CM | POA: Diagnosis not present

## 2016-06-22 DIAGNOSIS — R69 Illness, unspecified: Secondary | ICD-10-CM | POA: Diagnosis not present

## 2016-06-22 DIAGNOSIS — R0902 Hypoxemia: Secondary | ICD-10-CM | POA: Diagnosis not present

## 2016-07-19 DIAGNOSIS — Z7984 Long term (current) use of oral hypoglycemic drugs: Secondary | ICD-10-CM | POA: Diagnosis not present

## 2016-07-19 DIAGNOSIS — I10 Essential (primary) hypertension: Secondary | ICD-10-CM | POA: Diagnosis not present

## 2016-07-19 DIAGNOSIS — E782 Mixed hyperlipidemia: Secondary | ICD-10-CM | POA: Diagnosis not present

## 2016-07-19 DIAGNOSIS — E1165 Type 2 diabetes mellitus with hyperglycemia: Secondary | ICD-10-CM | POA: Diagnosis not present

## 2016-07-19 DIAGNOSIS — Z6841 Body Mass Index (BMI) 40.0 and over, adult: Secondary | ICD-10-CM | POA: Diagnosis not present

## 2016-08-02 ENCOUNTER — Other Ambulatory Visit: Payer: Self-pay | Admitting: *Deleted

## 2016-08-04 ENCOUNTER — Telehealth: Payer: Self-pay | Admitting: Cardiology

## 2016-08-04 NOTE — Telephone Encounter (Signed)
°  New Prob   *STAT* If patient is at the pharmacy, call can be transferred to refill team.   1. Which medications need to be refilled? (please list name of each medication and dose if known) nitroGLYCERIN (NITROSTAT) 0.4 MG SL tablet  2. Which pharmacy/location (including street and city if local pharmacy) is medication to be sent to? [facility]  3. Do they need a 30 day or 90 day supply? [place]

## 2016-08-05 MED ORDER — NITROGLYCERIN 0.4 MG SL SUBL
0.4000 mg | SUBLINGUAL_TABLET | SUBLINGUAL | 0 refills | Status: DC | PRN
Start: 2016-08-05 — End: 2018-02-19

## 2016-09-14 DIAGNOSIS — Z4651 Encounter for fitting and adjustment of gastric lap band: Secondary | ICD-10-CM | POA: Diagnosis not present

## 2016-10-20 DIAGNOSIS — E1165 Type 2 diabetes mellitus with hyperglycemia: Secondary | ICD-10-CM | POA: Diagnosis not present

## 2016-10-20 DIAGNOSIS — R05 Cough: Secondary | ICD-10-CM | POA: Diagnosis not present

## 2016-12-06 DIAGNOSIS — E119 Type 2 diabetes mellitus without complications: Secondary | ICD-10-CM | POA: Diagnosis not present

## 2016-12-06 DIAGNOSIS — Z7984 Long term (current) use of oral hypoglycemic drugs: Secondary | ICD-10-CM | POA: Diagnosis not present

## 2016-12-06 DIAGNOSIS — H2513 Age-related nuclear cataract, bilateral: Secondary | ICD-10-CM | POA: Diagnosis not present

## 2016-12-27 ENCOUNTER — Other Ambulatory Visit: Payer: Self-pay | Admitting: Cardiology

## 2016-12-28 DIAGNOSIS — R69 Illness, unspecified: Secondary | ICD-10-CM | POA: Diagnosis not present

## 2017-01-23 DIAGNOSIS — Z0000 Encounter for general adult medical examination without abnormal findings: Secondary | ICD-10-CM | POA: Diagnosis not present

## 2017-01-23 DIAGNOSIS — E559 Vitamin D deficiency, unspecified: Secondary | ICD-10-CM | POA: Diagnosis not present

## 2017-01-23 DIAGNOSIS — B009 Herpesviral infection, unspecified: Secondary | ICD-10-CM | POA: Diagnosis not present

## 2017-01-23 DIAGNOSIS — L719 Rosacea, unspecified: Secondary | ICD-10-CM | POA: Diagnosis not present

## 2017-01-23 DIAGNOSIS — L309 Dermatitis, unspecified: Secondary | ICD-10-CM | POA: Diagnosis not present

## 2017-01-23 DIAGNOSIS — E1165 Type 2 diabetes mellitus with hyperglycemia: Secondary | ICD-10-CM | POA: Diagnosis not present

## 2017-01-23 DIAGNOSIS — Z7984 Long term (current) use of oral hypoglycemic drugs: Secondary | ICD-10-CM | POA: Diagnosis not present

## 2017-01-23 DIAGNOSIS — E782 Mixed hyperlipidemia: Secondary | ICD-10-CM | POA: Diagnosis not present

## 2017-01-23 DIAGNOSIS — I10 Essential (primary) hypertension: Secondary | ICD-10-CM | POA: Diagnosis not present

## 2017-01-23 DIAGNOSIS — K219 Gastro-esophageal reflux disease without esophagitis: Secondary | ICD-10-CM | POA: Diagnosis not present

## 2017-02-09 DIAGNOSIS — R69 Illness, unspecified: Secondary | ICD-10-CM | POA: Diagnosis not present

## 2017-02-14 DIAGNOSIS — Z1231 Encounter for screening mammogram for malignant neoplasm of breast: Secondary | ICD-10-CM | POA: Diagnosis not present

## 2017-03-24 ENCOUNTER — Ambulatory Visit: Payer: Medicare HMO | Admitting: Cardiology

## 2017-03-24 ENCOUNTER — Encounter: Payer: Self-pay | Admitting: Cardiology

## 2017-03-24 VITALS — BP 118/62 | HR 64 | Ht 61.0 in | Wt 225.2 lb

## 2017-03-24 DIAGNOSIS — I10 Essential (primary) hypertension: Secondary | ICD-10-CM | POA: Diagnosis not present

## 2017-03-24 DIAGNOSIS — Z9861 Coronary angioplasty status: Secondary | ICD-10-CM | POA: Diagnosis not present

## 2017-03-24 DIAGNOSIS — I2119 ST elevation (STEMI) myocardial infarction involving other coronary artery of inferior wall: Secondary | ICD-10-CM

## 2017-03-24 DIAGNOSIS — I2510 Atherosclerotic heart disease of native coronary artery without angina pectoris: Secondary | ICD-10-CM

## 2017-03-24 DIAGNOSIS — E785 Hyperlipidemia, unspecified: Secondary | ICD-10-CM | POA: Diagnosis not present

## 2017-03-24 NOTE — Patient Instructions (Signed)
No change with current medications     Your physician wants you to follow-up in 12 month with DR HARDING. You will receive a reminder letter in the mail two months in advance. If you don't receive a letter, please call our office to schedule the follow-up appointment.   If you need a refill on your cardiac medications before your next appointment, please call your pharmacy.

## 2017-03-24 NOTE — Progress Notes (Signed)
PCP: Lawerance Cruel, MD  Clinic Note: Chief Complaint  Patient presents with  . Follow-up    No major complaints.  Notable weight loss  . Coronary Artery Disease    HPI: Sandra Beasley is a 67 y.o. female with a PMH below who presents today for delayed cardiology follow-up for CAD-PCI in the setting of a STEM back in November of 2014.    --Sandra Beasley had inferior STEMI with 100% occlusion of the RPDA as well as moderate to severe disease in the Large Lateral OM as well as LAD beyond the major diagonal branch.  The PDA was treated with 2 overlapping DES stents, and the remaining lesions were evaluated with a stress test 1 month post MI and there is no evidence of ischemia with normal EF.  Therefore the remaining lesions are being treated medically.  Sandra Beasley was last seen in November 2017 -was doing relatively well with no major issues.  Still doing Silver sneakers and yoga 3 days a week.  Was doing walking and now doing more true cardio exercises.  Unfortunately put back on weight. Has had issues with statins in the past.  Never started Zetia.  Recent Hospitalizations: None  Studies Personally Reviewed - (if available, images/films reviewed: From Epic Chart or Care Everywhere)  None  Interval History: Sandra Beasley presents here today for follow-up overall doing quite well.  Sandra Beasley has gone back to have her lap band retightened, and since doing so has lost a significant amount of weight.  Her blood sugars have gotten better, her A1c is improved as has her blood pressure.  Her dyspnea is also notably some improvement.  Sandra Beasley is only noticing exertional dyspnea if Sandra Beasley does overexert.  Otherwise no resting or exertional chest tightness pressure with routine activity or chest discomfort with exercise. No PND, orthopnea with only minimal lower extremity edema.  No sensation of rapid irregular heartbeats or palpitations, syncope/near syncope or TIA/amaurosis fugax.  No chest pain or shortness of  breath with rest or exertion. No PND, orthopnea or edema. No palpitations, lightheadedness, dizziness, weakness or syncope/near syncope. No melena, hematochezia, hematuria, or epstaxis. No claudication.  ROS: A comprehensive was performed. Review of Systems  Constitutional: Positive for weight loss. Negative for malaise/fatigue (Overall energy has notably improved).  HENT: Negative for congestion and nosebleeds.   Respiratory: Negative for cough and shortness of breath.   Cardiovascular: Positive for leg swelling (Trivial). Negative for claudication.  Gastrointestinal: Positive for abdominal pain and nausea.       Sandra Beasley still has the nausea and occasional vomiting with when Sandra Beasley had her lap band tightened, but is willing to sacrifice this symptom in order to keep her weight down and labs including lipids and glucose levels and control.  Genitourinary: Negative for hematuria.  Musculoskeletal: Positive for joint pain (Right shoulder as well as ankles). Negative for myalgias.  Neurological: Positive for dizziness (Sometimes positional). Negative for focal weakness.  Psychiatric/Behavioral: Positive for depression (Dysthymia). The patient is not nervous/anxious.   All other systems reviewed and are negative.  I have reviewed and (if needed) personally updated the patient's problem list, medications, allergies, past medical and surgical history, social and family history.   Past Medical History:  Diagnosis Date  . CAD S/P percutaneous coronary angioplasty - DES x2 in RCA 01/02/2013; 02/2013   a. 100% RCA - Xience Xpedition DES 2.25 mm x 15 mm + 2.25 mm x 8 mm overlapping, mod- severe distal LAD and prox OM1 lesions.;b.  Myoview 02/2013: No ischemia or Infarction, Normal EF.  Marland Kitchen GERD (gastroesophageal reflux disease) 03/06/2013  . Hyperlipidemia associated with type 2 diabetes mellitus ([place])   . Hypertension   . Obesity   . STEMI (ST elevation myocardial infarction) ([place]) 01/02/13   --> PCI; EF 60  and 65%. Gr 1 DD & No regional WMA, Otherwise relatively normal.  . Type 2 diabetes mellitus with circulatory disorder [facility])     Past Surgical History:  Procedure Laterality Date  . ABDOMINAL HYSTERECTOMY    . CARDIAC CATHETERIZATION  01/02/2013   100% mid-distal RCA.; 70-80% proximal OM, distal LAD 80%.  . CORONARY ANGIOPLASTY WITH STENT PLACEMENT  01/02/2013    PCI-RCA: Xience Xpedition 2.25 mm x 15 mm, 2.25 mm x 8 mm (2.4 mm)  . LAPAROSCOPIC GASTRIC BANDING  2006  . LAPAROSCOPIC HYSTERECTOMY  1996  . LEFT HEART CATHETERIZATION WITH CORONARY ANGIOGRAM N/A 01/02/2013   Procedure: LEFT HEART CATHETERIZATION WITH CORONARY ANGIOGRAM;  Surgeon: Leonie Man, MD;  Location: [facility] CATH LAB;  Service: Cardiovascular;  Laterality: N/A;  . TONSILLECTOMY  1966  . TRANSTHORACIC ECHOCARDIOGRAM  01/04/2013   EF 60 and 65%. Grade 1 diastolic dysfunction with no regional wall motion abnormalities. Otherwise relatively normal.    Current Meds  Medication Sig  . aspirin EC 81 MG tablet Take 1 tablet (81 mg total) by mouth daily.  Marland Kitchen b complex vitamins tablet Take 1 tablet by mouth daily.  . carvedilol (COREG) 6.25 MG tablet Take 1 tablet (6.25 mg total) by mouth 2 (two) times daily with a meal.  . Cholecalciferol 1000 UNITS TBDP Take 1,000 Units by mouth every morning.   . Coenzyme Q10 (CO Q 10 PO) Take by mouth.  Marland Kitchen glimepiride (AMARYL) 4 MG tablet Take 4 mg by mouth daily before breakfast.    . irbesartan (AVAPRO) 150 MG tablet Take 1 tablet (150 mg total) by mouth every morning.  . metFORMIN (GLUCOPHAGE) 500 MG tablet Take 500 mg by mouth 2 (two) times daily with a meal.   . metroNIDAZOLE (METROCREAM) 0.75 % cream Apply topically 2 (two) times daily. Apply to face  . nitroGLYCERIN (NITROSTAT) 0.4 MG SL tablet Place 1 tablet (0.4 mg total) under the tongue every 5 (five) minutes as needed for chest pain.  Marland Kitchen omega-3 acid ethyl esters (LOVAZA) 1 G capsule Take 1 g by mouth every morning.   .  pantoprazole (PROTONIX) 40 MG tablet Take 1 tablet (40 mg total) by mouth daily. NEED OV.  . rosuvastatin (CRESTOR) 10 MG tablet Take 10 mg by mouth daily.  Marland Kitchen triamcinolone cream (KENALOG) 0.1 % Apply 1 application topically as directed.  . TURMERIC CURCUMIN PO Take 1 tablet by mouth daily.  . valACYclovir (VALTREX) 1000 MG tablet TAKE 2 TABLETS TWICE A DAY (2 DOSES PER EPISODE) ORALLY 5 DAYS  -No longer taking Brilinta -stopped in November 2017 Currently taking irbesartan -not sure of dose Apparently is no longer on a Zetia -0 -- converted back to Crestor  Allergies  Allergen Reactions  . Ace Inhibitors Cough    Social History   Tobacco Use  . Smoking status: Never Smoker  . Smokeless tobacco: Never Used  Substance Use Topics  . Alcohol use: No  . Drug use: No   Social History   Social History Narrative   Sandra Beasley is a divorced nonsmoker who works for [employer]. Sandra Beasley is currently now in cardiac rehabilitation. Has changed her dietary habits to a Vegan lifestyle.    family  history includes Cancer in her maternal aunt. Sandra Beasley was adopted.  Wt Readings from Last 3 Encounters:  03/24/17 225 lb 3.2 oz (102.2 kg)  12/14/15 246 lb 6.4 oz (111.8 kg)  05/12/15 237 lb (107.5 kg)  - had band tightened up .  PHYSICAL EXAM BP 118/62   Pulse 64   Ht 5\' 1"  (1.549 m)   Wt 225 lb 3.2 oz (102.2 kg)   BMI 42.55 kg/m  Physical Exam  Constitutional: Sandra Beasley is oriented to person, place, and time. Sandra Beasley appears well-developed and well-nourished. No distress.   Still morbidly obese,  but with notable weight loss.  Looks much better.  Well-groomed.   HENT:  Head: Normocephalic and atraumatic.  Mouth/Throat: No oropharyngeal exudate.  Eyes: EOM are normal.  Neck: Normal range of motion. Neck supple. No hepatojugular reflux and no JVD present. Carotid bruit is not present.  Cardiovascular: Normal rate, normal heart sounds and intact distal pulses.  No extrasystoles are present. PMI is not  displaced (Difficult to palpate). Exam reveals no gallop and no friction rub.  No murmur heard. Pulmonary/Chest: Effort normal and breath sounds normal. No respiratory distress. Sandra Beasley has no wheezes. Sandra Beasley exhibits no tenderness.  Abdominal: Soft. Bowel sounds are normal. Sandra Beasley exhibits no distension. There is no tenderness (Mild tenderness if you press deep).  Musculoskeletal: Normal range of motion. Sandra Beasley exhibits edema (Trivial).  Neurological: Sandra Beasley is alert and oriented to person, place, and time.  Skin: Skin is warm and dry.  Psychiatric: Sandra Beasley has a normal mood and affect. Her behavior is normal. Judgment and thought content normal.  Nursing note and vitals reviewed.   Adult ECG Report  Rate: 64;  Rhythm: normal sinus rhythm and 1 degree AV block.  Poor R wave progression (not likely consistent with anterior MI, age undetermined);   Narrative Interpretation: Stable EKG.   Other studies Reviewed: Additional studies/ records that were reviewed today include:  Recent Labs: Followed by PCP -labs from January 23, 2017  TC 128, TG 77, LDL 59, HDL 54.  A1c 6.2.  BUN/creatinine 14/1.05.  TSH 1.35.  LFTs normal.  H/H 13/39.9.   ASSESSMENT / PLAN: CAD status post PCI doing well, stable no active angina or heart failure symptoms.  . Blood pressure and lipids well controlled. No change to medications  Annual follow-up  Problem List Items Addressed This Visit    CAD S/P percutaneous coronary angioplasty - DES x2 in RCA - Primary (Chronic)    4 years out post MI.  Doing well with no further angina.  No longer taking Brilinta.  Sandra Beasley is on aspirin and rosuvastatin along with carvedilol and Avapro.  Doing well with no major symptoms.      Relevant Medications   rosuvastatin (CRESTOR) 10 MG tablet   Other Relevant Orders   EKG 12-Lead   Essential hypertension (Chronic)    Blood pressure looks great today on current meds.  On ARB and carvedilol.      Relevant Medications   rosuvastatin (CRESTOR)  10 MG tablet   Hyperlipidemia LDL goal <70 (Chronic)    Sandra Beasley is now back on 10 mg of rosuvastatin for at least a year now.  Now on Lovaza instead of Zetia.  Labs are being followed by PCP.  Not available. We will try to get labs from PCP.      Relevant Medications   rosuvastatin (CRESTOR) 10 MG tablet   Severe obesity (BMI >= 40) (HCC) (Chronic)    A lot of her weight  issues revolve around her lap band.  Now that Sandra Beasley has had it tightened back down again Sandra Beasley is lost a lot of weight.  This is a hard trade-off for her, because Sandra Beasley has lots of abdominal pain and nausea associated LAP-BAND being tightened, but if Sandra Beasley does not have a tightened, her weight goes up dramatically and her glycemic control becomes very poor.      ST segment elevation myocardial infarction (STEMI) of inferolateral wall, subsequent episode of care [facility]) (Chronic)    PCI to the RPDA in the setting of inferior STEMI.  No follow on symptoms of angina and negative Myoview to assess remaining residual lesions.  No regional wall motion normalities and no sign of significant infarct on Myoview.  Plan: Continue on stable dose of aspirin, statin, beta-blocker and ARB. No heart failure issues.  No recurrent angina.       Relevant Medications   rosuvastatin (CRESTOR) 10 MG tablet   Other Relevant Orders   EKG 12-Lead      Current medicines are reviewed at length with the patient today. (+/- concerns) -asks about having to carry her stent card The following changes have been made:My recommendation is to carry the card with her at all times because this is a statement of her medical history more so than what medicines Sandra Beasley is taking.  Patient Instructions  No change with current medications     Your physician wants you to follow-up in 12 month with DR HARDING. You will receive a reminder letter in the mail two months in advance. If you don't receive a letter, please call our office to schedule the follow-up  appointment.   If you need a refill on your cardiac medications before your next appointment, please call your pharmacy.    Studies Ordered:   Orders Placed This Encounter  Procedures  . EKG 12-Lead      Glenetta Hew, M.D., M.S. Interventional Cardiologist   Pager # [phone] Phone # [phone] [address] [place], [place] [address]   Thank you for choosing [facility] at [facility]!!

## 2017-03-26 ENCOUNTER — Encounter: Payer: Self-pay | Admitting: Cardiology

## 2017-03-26 NOTE — Assessment & Plan Note (Signed)
Blood pressure looks great today on current meds.  On ARB and carvedilol.

## 2017-03-26 NOTE — Assessment & Plan Note (Signed)
PCI to the RPDA in the setting of inferior STEMI.  No follow on symptoms of angina and negative Myoview to assess remaining residual lesions.  No regional wall motion normalities and no sign of significant infarct on Myoview.  Plan: Continue on stable dose of aspirin, statin, beta-blocker and ARB. No heart failure issues.  No recurrent angina.

## 2017-03-26 NOTE — Assessment & Plan Note (Signed)
She is now back on 10 mg of rosuvastatin for at least a year now.  Now on Lovaza instead of Zetia.  Labs are being followed by PCP.  Not available. We will try to get labs from PCP.

## 2017-03-26 NOTE — Assessment & Plan Note (Signed)
4 years out post MI.  Doing well with no further angina.  No longer taking Brilinta.  She is on aspirin and rosuvastatin along with carvedilol and Avapro.  Doing well with no major symptoms.

## 2017-03-26 NOTE — Assessment & Plan Note (Signed)
A lot of her weight issues revolve around her lap band.  Now that she has had it tightened back down again she is lost a lot of weight.  This is a hard trade-off for her, because she has lots of abdominal pain and nausea associated LAP-BAND being tightened, but if she does not have a tightened, her weight goes up dramatically and her glycemic control becomes very poor.

## 2017-07-17 DIAGNOSIS — R69 Illness, unspecified: Secondary | ICD-10-CM | POA: Diagnosis not present

## 2017-07-24 DIAGNOSIS — R609 Edema, unspecified: Secondary | ICD-10-CM | POA: Diagnosis not present

## 2017-07-24 DIAGNOSIS — E1165 Type 2 diabetes mellitus with hyperglycemia: Secondary | ICD-10-CM | POA: Diagnosis not present

## 2017-08-30 DIAGNOSIS — L723 Sebaceous cyst: Secondary | ICD-10-CM | POA: Diagnosis not present

## 2017-08-30 DIAGNOSIS — K219 Gastro-esophageal reflux disease without esophagitis: Secondary | ICD-10-CM | POA: Diagnosis not present

## 2017-09-06 ENCOUNTER — Encounter: Payer: Self-pay | Admitting: Gastroenterology

## 2017-10-31 ENCOUNTER — Encounter: Payer: Self-pay | Admitting: Gastroenterology

## 2017-10-31 ENCOUNTER — Ambulatory Visit (INDEPENDENT_AMBULATORY_CARE_PROVIDER_SITE_OTHER): Payer: Medicare HMO | Admitting: Gastroenterology

## 2017-10-31 VITALS — BP 120/70 | HR 72 | Ht 60.0 in | Wt 217.2 lb

## 2017-10-31 DIAGNOSIS — K219 Gastro-esophageal reflux disease without esophagitis: Secondary | ICD-10-CM | POA: Diagnosis not present

## 2017-10-31 DIAGNOSIS — Z86010 Personal history of colonic polyps: Secondary | ICD-10-CM

## 2017-10-31 NOTE — Patient Instructions (Signed)
Patient advised to avoid spicy, acidic, citrus, chocolate, mints, fruit and fruit juices.  Limit the intake of caffeine, alcohol and Soda.  Don't exercise too soon after eating.  Don't lie down within 3-4 hours of eating.  Elevate the head of your bed.  You have been scheduled for an endoscopy. Please follow written instructions given to you at your visit today. If you use inhalers (even only as needed), please bring them with you on the day of your procedure. Your physician has requested that you go to www.startemmi.com and enter the access code given to you at your visit today. This web site gives a general overview about your procedure. However, you should still follow specific instructions given to you by our office regarding your preparation for the procedure.  It has been recommended to you by your physician that you have a(n) Colonoscopy completed. Per your request, we did not schedule the procedure(s) today. Please contact our office at [phone] should you decide to have the procedure completed.  Normal BMI (Body Mass Index- based on height and weight) is between 23 and 30. Your BMI today is Body mass index is 42.43 kg/m. Marland Kitchen Please consider follow up  regarding your BMI with your Primary Care Provider.  Thank you for choosing me and [facility].  Pricilla Riffle. Dagoberto Ligas., MD., Marval Regal

## 2017-10-31 NOTE — Progress Notes (Signed)
History of Present Illness: This is a 67 year old female referred by Lawerance Cruel, MD for the evaluation of GERD, hematemesis. Lap band in 2006.  She states her lap band was tightened by Dr. Kaylyn Lim about 1 year ago and she had a 30 pound weight loss.  She had persistent chest complaints 2 years with initial symptoms concerning for cardiac etiology.  She was placed on pantoprazole 40 mg daily and her symptoms abated.  She has remained on pantoprazole since then.  She states that her daughter, who practices [place] medicine, advised her to discontinue pantoprazole.  When she discontinued it she had a return of frequent reflux symptoms, frequent postprandial fullness and several episodes of vomiting "rusty" colored material which she felt was blood mixed with food.  She states after having her lap band tightened she had intermittent episodes of nausea and vomiting.  These episodes have subsided recently. Denies weight loss, abdominal pain, constipation, diarrhea, change in stool caliber, melena, hematochezia, dysphagia, chest pain.     Allergies  Allergen Reactions  . Ace Inhibitors Cough  . Statins     Can take Crestor   Outpatient Medications Prior to Visit  Medication Sig Dispense Refill  . aspirin EC 81 MG tablet Take 1 tablet (81 mg total) by mouth daily. 90 tablet 3  . b complex vitamins tablet Take 1 tablet by mouth daily.    . carvedilol (COREG) 6.25 MG tablet Take 1 tablet (6.25 mg total) by mouth 2 (two) times daily with a meal. 180 tablet 2  . Cholecalciferol 1000 UNITS TBDP Take 1,000 Units by mouth every morning.     . Coenzyme Q10 (CO Q 10 PO) Take by mouth.    Marland Kitchen glimepiride (AMARYL) 4 MG tablet Take 4 mg by mouth daily before breakfast.      . irbesartan (AVAPRO) 150 MG tablet Take 1 tablet (150 mg total) by mouth every morning. 30 tablet 5  . metFORMIN (GLUCOPHAGE) 500 MG tablet Take 500 mg by mouth 2 (two) times daily with a meal.     . metroNIDAZOLE (METROCREAM)  0.75 % cream Apply topically 2 (two) times daily. Apply to face    . omega-3 acid ethyl esters (LOVAZA) 1 G capsule Take 1 g by mouth every morning.     . rosuvastatin (CRESTOR) 10 MG tablet Take 10 mg by mouth daily.  4  . triamcinolone cream (KENALOG) 0.1 % Apply 1 application topically as directed.    . TURMERIC CURCUMIN PO Take 1 tablet by mouth daily.    . valACYclovir (VALTREX) 1000 MG tablet TAKE 2 TABLETS TWICE A DAY (2 DOSES PER EPISODE) ORALLY 5 DAYS  1  . nitroGLYCERIN (NITROSTAT) 0.4 MG SL tablet Place 1 tablet (0.4 mg total) under the tongue every 5 (five) minutes as needed for chest pain. (Patient not taking: Reported on 10/31/2017) 25 tablet 0  . pantoprazole (PROTONIX) 40 MG tablet Take 1 tablet (40 mg total) by mouth daily. NEED OV. 90 tablet 0   No facility-administered medications prior to visit.    Past Medical History:  Diagnosis Date  . Adenomatous polyp of colon 09/2005  . CAD S/P percutaneous coronary angioplasty - DES x2 in RCA 01/02/2013; 02/2013   a. 100% RCA - Xience Xpedition DES 2.25 mm x 15 mm + 2.25 mm x 8 mm overlapping, mod- severe distal LAD and prox OM1 lesions.;b.  Myoview 02/2013: No ischemia or Infarction, Normal EF.  Marland Kitchen GERD (gastroesophageal reflux disease)  03/06/2013  . Hyperlipidemia associated with type 2 diabetes mellitus ([place])   . Hypertension   . Obesity   . STEMI (ST elevation myocardial infarction) ([place]) 01/02/13   --> PCI; EF 60 and 65%. Gr 1 DD & No regional WMA, Otherwise relatively normal.  . Type 2 diabetes mellitus with circulatory disorder ([place])   . Vitamin D deficiency    Past Surgical History:  Procedure Laterality Date  . ABDOMINAL HYSTERECTOMY    . CARDIAC CATHETERIZATION  01/02/2013   100% mid-distal RCA.; 70-80% proximal OM, distal LAD 80%.  . CORONARY ANGIOPLASTY WITH STENT PLACEMENT  01/02/2013    PCI-RCA: Xience Xpedition 2.25 mm x 15 mm, 2.25 mm x 8 mm (2.4 mm)  . LAPAROSCOPIC GASTRIC BANDING  2006  . LAPAROSCOPIC  HYSTERECTOMY  1996  . LEFT HEART CATHETERIZATION WITH CORONARY ANGIOGRAM N/A 01/02/2013   Procedure: LEFT HEART CATHETERIZATION WITH CORONARY ANGIOGRAM;  Surgeon: Leonie Man, MD;  Location: [facility] CATH LAB;  Service: Cardiovascular;  Laterality: N/A;  . TONSILLECTOMY  1966  . TRANSTHORACIC ECHOCARDIOGRAM  01/04/2013   EF 60 and 65%. Grade 1 diastolic dysfunction with no regional wall motion abnormalities. Otherwise relatively normal.   Social History   Socioeconomic History  . Marital status: Divorced    Spouse name: Not on file  . Number of children: 3  . Years of education: Not on file  . Highest education level: Not on file  Occupational History  . Occupation: Pharmacist, hospital  Social Needs  . Financial resource strain: Not on file  . Food insecurity:    Worry: Not on file    Inability: Not on file  . Transportation needs:    Medical: Not on file    Non-medical: Not on file  Tobacco Use  . Smoking status: Never Smoker  . Smokeless tobacco: Never Used  Substance and Sexual Activity  . Alcohol use: No  . Drug use: No  . Sexual activity: Not on file  Lifestyle  . Physical activity:    Days per week: Not on file    Minutes per session: Not on file  . Stress: Not on file  Relationships  . Social connections:    Talks on phone: Not on file    Gets together: Not on file    Attends religious service: Not on file    Active member of club or organization: Not on file    Attends meetings of clubs or organizations: Not on file    Relationship status: Not on file  Other Topics Concern  . Not on file  Social History Narrative   She is a divorced nonsmoker who works for [employer]. She is currently now in cardiac rehabilitation. Has changed her dietary habits to a Vegan lifestyle.   Family History  Adopted: Yes  Problem Relation Age of Onset  . Breast cancer Maternal Aunt   . Diabetes Maternal Aunt   . Heart disease Maternal Aunt   . Diabetes Maternal Uncle   . Diabetes  gravidarum Maternal Uncle   . Colon cancer Neg Hx   . Stomach cancer Neg Hx       Review of Systems: Pertinent positive and negative review of systems were noted in the above HPI section. All other review of systems were otherwise negative.    Physical Exam: General: Well developed, well nourished, obese, no acute distress Head: Normocephalic and atraumatic Eyes:  sclerae anicteric, EOMI Ears: Normal auditory acuity Mouth: No deformity or lesions Neck: Supple, no masses or  thyromegaly Lungs: Clear throughout to auscultation Heart: Regular rate and rhythm; no murmurs, rubs or bruits Abdomen: Soft, non tender and non distended. No masses, hepatosplenomegaly or hernias noted. Normal Bowel sounds Rectal: Not done Musculoskeletal: Symmetrical with no gross deformities  Skin: No lesions on visible extremities Pulses:  Normal pulses noted Extremities: No clubbing, cyanosis, edema or deformities noted Neurological: Alert oriented x 4, grossly nonfocal Cervical Nodes:  No significant cervical adenopathy Inguinal Nodes: No significant inguinal adenopathy Psychological:  Alert and cooperative. Normal mood and affect  Assessment and Recommendations:  1. GERD.  Hematemesis.  Intermittent nausea and vomiting. S/P lap band.  Rule out esophagitis, ulcer.  Advised to closely follow antireflux measures and eat multiple small meals.  Advised to resume pantoprazole and we discussed pantoprazole and PPIs in general for management of GERD.  Tums as needed.  Schedule EGD. The risks (including bleeding, perforation, infection, missed lesions, medication reactions and possible hospitalization or surgery if complications occur), benefits, and alternatives to endoscopy with possible biopsy and possible dilation were discussed with the patient and they consent to proceed.   2. S/P lap band.  Intermittent nausea and vomiting likely related.  This could also be exacerbating reflux symptoms.  3. Personal  history of adenomatous colon polyps.  Last colonoscopy in October 2012 was free of polyps.  She was recommended to undergo surveillance colonoscopy now 7 years since her last colonoscopy however she declined.  Reset colonoscopy for a 10-year interval.   cc: Lawerance Cruel, MD [address] [place], [place] [address]

## 2017-12-04 ENCOUNTER — Encounter: Payer: Self-pay | Admitting: Gastroenterology

## 2017-12-07 DIAGNOSIS — Z7984 Long term (current) use of oral hypoglycemic drugs: Secondary | ICD-10-CM | POA: Diagnosis not present

## 2017-12-07 DIAGNOSIS — H2513 Age-related nuclear cataract, bilateral: Secondary | ICD-10-CM | POA: Diagnosis not present

## 2017-12-07 DIAGNOSIS — E119 Type 2 diabetes mellitus without complications: Secondary | ICD-10-CM | POA: Diagnosis not present

## 2017-12-14 ENCOUNTER — Ambulatory Visit (AMBULATORY_SURGERY_CENTER): Payer: Medicare HMO | Admitting: Gastroenterology

## 2017-12-14 ENCOUNTER — Encounter: Payer: Self-pay | Admitting: Gastroenterology

## 2017-12-14 VITALS — BP 149/77 | HR 60 | Temp 98.9°F | Resp 16 | Ht 60.0 in | Wt 217.0 lb

## 2017-12-14 DIAGNOSIS — K319 Disease of stomach and duodenum, unspecified: Secondary | ICD-10-CM | POA: Diagnosis not present

## 2017-12-14 DIAGNOSIS — K222 Esophageal obstruction: Secondary | ICD-10-CM

## 2017-12-14 DIAGNOSIS — K3189 Other diseases of stomach and duodenum: Secondary | ICD-10-CM

## 2017-12-14 DIAGNOSIS — K219 Gastro-esophageal reflux disease without esophagitis: Secondary | ICD-10-CM | POA: Diagnosis not present

## 2017-12-14 DIAGNOSIS — K920 Hematemesis: Secondary | ICD-10-CM

## 2017-12-14 DIAGNOSIS — K210 Gastro-esophageal reflux disease with esophagitis: Secondary | ICD-10-CM | POA: Diagnosis not present

## 2017-12-14 MED ORDER — SODIUM CHLORIDE 0.9 % IV SOLN: 500.0000 mL | Freq: Once | INTRAVENOUS | Status: DC

## 2017-12-14 MED ORDER — PANTOPRAZOLE SODIUM 40 MG PO TBEC: 40.0000 mg | DELAYED_RELEASE_TABLET | Freq: Every day | ORAL | 12 refills | Status: DC

## 2017-12-14 NOTE — Progress Notes (Signed)
Called to room to assist during endoscopic procedure.  Patient ID and intended procedure confirmed with present staff. Received instructions for my participation in the procedure from the performing physician.  

## 2017-12-14 NOTE — Op Note (Addendum)
[facility] Patient Name: Sandra Beasley Procedure Date: 12/14/2017 1:02 PM MRN: [id number] Endoscopist: Ladene Artist , MD Age: 67 Referring MD:  Date of Birth: 1951/01/11 Gender: Female Account #: [id number] Procedure:                Upper GI endoscopy Indications:              Gastroesophageal reflux disease, Hematemesis Medicines:                Monitored Anesthesia Care Procedure:                Pre-Anesthesia Assessment:                           - Prior to the procedure, a History and Physical                            was performed, and patient medications and                            allergies were reviewed. The patient's tolerance of                            previous anesthesia was also reviewed. The risks                            and benefits of the procedure and the sedation                            options and risks were discussed with the patient.                            All questions were answered, and informed consent                            was obtained. Prior Anticoagulants: The patient has                            taken no previous anticoagulant or antiplatelet                            agents. ASA Grade Assessment: III - A patient with                            severe systemic disease. After reviewing the risks                            and benefits, the patient was deemed in                            satisfactory condition to undergo the procedure.                           After obtaining informed consent, the endoscope was  passed under direct vision. Throughout the                            procedure, the patient's blood pressure, pulse, and                            oxygen saturations were monitored continuously. The                            Model GIF-HQ190 [id number]) scope was introduced                            through the mouth, and advanced to the second part                            of  duodenum. The upper GI endoscopy was                            accomplished without difficulty. The patient                            tolerated the procedure well. Scope In: Scope Out: Findings:                 LA Grade C (one or more mucosal breaks continuous                            between tops of 2 or more mucosal folds, less than                            75% circumference) esophagitis with no bleeding was                            found in the distal esophagus.                           The lumen of the proximal esophagus and mid                            esophagus was moderately dilated with retained                            fluid which was suctioned.                           An extrinsic severe stenosis was found in the                            gastric fundus, apparent site of lap band. About 5                            cm of stomach was proximal to the band. This was  traversed.                           Localized moderate inflammation characterized by                            friability and granularity was found in the gastric                            fundus at the site of the stenosis.                           Multiple localized, small non-bleeding erosions                            were found in the gastric antrum. There were no                            stigmata of recent bleeding. Biopsies were taken                            with a cold forceps for histology.                           Multiple localized erosions without bleeding were                            found in the duodenal bulb.                           The second portion of the duodenum was normal. Complications:            No immediate complications. Estimated Blood Loss:     Estimated blood loss was minimal. Impression:               - LA Grade C reflux esophagitis.                           - Dilated proximal esophagus.                           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                           - Gastritis.                           - Non-bleeding erosive gastropathy. Biopsied.                           - Duodenal erosions without bleeding.                           - Normal second portion of  the duodenum. Recommendation:           - Patient has a contact number available for                            emergencies. The signs and symptoms of potential                            delayed complications were discussed with the                            patient. Return to normal activities tomorrow.                            Written discharge instructions were provided to the                            patient.                           - Resume previous diet.                           - Antireflux measures.                           - Continue present medications.                           - Resume pantoprazole 40 mg po daily as previously                            recommended.                           - Await pathology results.                           - Return to Bariatric clinic at appointment to be                            scheduled. Ladene Artist, MD 12/14/2017 1:31:43 PM This report has been signed electronically.

## 2017-12-14 NOTE — Patient Instructions (Signed)
Antireflux measures. Continue present medications. Resume Pantoprazole 40 mg daily. Return to Bariatric clinic. Please read handouts provided.     YOU HAD AN ENDOSCOPIC PROCEDURE TODAY AT [facility]:   Refer to the procedure report that was given to you for any specific questions about what was found during the examination.  If the procedure report does not answer your questions, please call your gastroenterologist to clarify.  If you requested that your care partner not be given the details of your procedure findings, then the procedure report has been included in a sealed envelope for you to review at your convenience later.  YOU SHOULD EXPECT: Some feelings of bloating in the abdomen. Passage of more gas than usual.  Walking can help get rid of the air that was put into your GI tract during the procedure and reduce the bloating. If you had a lower endoscopy (such as a colonoscopy or flexible sigmoidoscopy) you may notice spotting of blood in your stool or on the toilet paper. If you underwent a bowel prep for your procedure, you may not have a normal bowel movement for a few days.  Please Note:  You might notice some irritation and congestion in your nose or some drainage.  This is from the oxygen used during your procedure.  There is no need for concern and it should clear up in a day or so.  SYMPTOMS TO REPORT IMMEDIATELY:     Following upper endoscopy (EGD)  Vomiting of blood or coffee ground material  New chest pain or pain under the shoulder blades  Painful or persistently difficult swallowing  New shortness of breath  Fever of 100F or higher  Black, tarry-looking stools  For urgent or emergent issues, a gastroenterologist can be reached at any hour by calling [phone].   DIET:  We do recommend a small meal at first, but then you may proceed to your regular diet.  Drink plenty of fluids but you should avoid alcoholic beverages for 24 hours.  ACTIVITY:   You should plan to take it easy for the rest of today and you should NOT DRIVE or use heavy machinery until tomorrow (because of the sedation medicines used during the test).    FOLLOW UP: Our staff will call the number listed on your records the next business day following your procedure to check on you and address any questions or concerns that you may have regarding the information given to you following your procedure. If we do not reach you, we will leave a message.  However, if you are feeling well and you are not experiencing any problems, there is no need to return our call.  We will assume that you have returned to your regular daily activities without incident.  If any biopsies were taken you will be contacted by phone or by letter within the next 1-3 weeks.  Please call us at [phone] if you have not heard about the biopsies in 3 weeks.    SIGNATURES/CONFIDENTIALITY: You and/or your care partner have signed paperwork which will be entered into your electronic medical record.  These signatures attest to the fact that that the information above on your After Visit Summary has been reviewed and is understood.  Full responsibility of the confidentiality of this discharge information lies with you and/or your care-partner.

## 2017-12-14 NOTE — Progress Notes (Signed)
PT taken to PACU. Monitors in place. VSS. Report given to RN. 

## 2017-12-15 ENCOUNTER — Telehealth: Payer: Self-pay | Admitting: *Deleted

## 2017-12-15 DIAGNOSIS — L723 Sebaceous cyst: Secondary | ICD-10-CM | POA: Diagnosis not present

## 2017-12-15 DIAGNOSIS — L821 Other seborrheic keratosis: Secondary | ICD-10-CM | POA: Diagnosis not present

## 2017-12-15 DIAGNOSIS — Z23 Encounter for immunization: Secondary | ICD-10-CM | POA: Diagnosis not present

## 2017-12-15 DIAGNOSIS — L719 Rosacea, unspecified: Secondary | ICD-10-CM | POA: Diagnosis not present

## 2017-12-15 NOTE — Telephone Encounter (Signed)
  Follow up Call-  Call back number 12/14/2017  Post procedure Call Back phone  # [phone]  Permission to leave phone message Yes  Some recent data might be hidden     Patient questions:  Do you have a fever, pain , or abdominal swelling? No. Pain Score  0 *  Have you tolerated food without any problems? Yes.    Have you been able to return to your normal activities? Yes.    Do you have any questions about your discharge instructions: Diet   No. Medications  No. Follow up visit  Yes.    Do you have questions or concerns about your Care? No.  Actions: * If pain score is 4 or above: No action needed, pain <4.  Pt. Wanted to schedule a appointment to discuss results,explained to her that she need to allow time for the doctor to obtain results of pathology,number provided to her to schedule appointment and she verbalize understanding.

## 2017-12-15 NOTE — Telephone Encounter (Signed)
  Follow up Call-  Call back number 12/14/2017  Post procedure Call Back phone  # [phone]  Permission to leave phone message Yes  Some recent data might be hidden     Patient questions:  Message left to call us if necessary.

## 2018-01-02 ENCOUNTER — Encounter: Payer: Self-pay | Admitting: Gastroenterology

## 2018-01-22 ENCOUNTER — Encounter: Payer: Self-pay | Admitting: Gastroenterology

## 2018-01-22 ENCOUNTER — Ambulatory Visit: Payer: Medicare HMO | Admitting: Gastroenterology

## 2018-01-22 VITALS — BP 116/60 | HR 74 | Ht 60.0 in | Wt 219.8 lb

## 2018-01-22 DIAGNOSIS — K2100 Gastro-esophageal reflux disease with esophagitis, without bleeding: Secondary | ICD-10-CM

## 2018-01-22 DIAGNOSIS — K3189 Other diseases of stomach and duodenum: Secondary | ICD-10-CM | POA: Diagnosis not present

## 2018-01-22 DIAGNOSIS — K2980 Duodenitis without bleeding: Secondary | ICD-10-CM | POA: Diagnosis not present

## 2018-01-22 NOTE — Patient Instructions (Signed)
Keep your appointment with Dr. Hassell Done.  Use 4" bed blocks to elevate the head of your bed.   Patient advised to avoid spicy, acidic, citrus, chocolate, mints, fruit and fruit juices.  Limit the intake of caffeine, alcohol and Soda.  Don't exercise too soon after eating.  Don't lie down within 3-4 hours of eating.  Elevate the head of your bed.  The name of the weight loss/bariatric doctor for [facility] is Dr. Dennard Nip.   Thank you for choosing me and Sandra Beasley.  Sandra Beasley. Dagoberto Ligas., MD., [facility]

## 2018-01-22 NOTE — Progress Notes (Signed)
    History of Present Illness: This is a 67 year old female returning for follow-up of reflux esophagitis.  Her reflux symptoms have improved on pantoprazole 40 mg daily.  She notes mild heartburn, occasional regurgitation a sore throat and vocal fatigue.  She has questions about the endoscopy findings, pathology report, current and future GERD management and her lap band management.   EGD 12/24/2017 - LA Grade C reflux esophagitis. - Dilated proximal esophagus. - Gastric stenosis was found in the gastric fundus, apparent site of lap band. - Gastritis. - Non-bleeding erosive gastropathy. Biopsied. - Duodenal erosions without bleeding. - Normal second portion of the duodenum.  - REACTIVE GASTROPATHY. - THERE IS NO EVIDENCE OF HELICOBACTER PYLORI, DYSPLASIA, OR MALIGNANCY. - SEE COMMENT.  Current Medications, Allergies, Past Medical History, Past Surgical History, Family History and Social History were reviewed in [employer] record.   Physical Exam: General: Well developed, well nourished, no acute distress Head: Normocephalic and atraumatic Eyes:  sclerae anicteric, EOMI Ears: Normal auditory acuity Mouth: No deformity or lesions Lungs: Clear throughout to auscultation Heart: Regular rate and rhythm; no murmurs, rubs or bruits Abdomen: Soft, non tender and non distended. No masses, hepatosplenomegaly or hernias noted. Normal Bowel sounds Rectal: Not done Musculoskeletal: Symmetrical with no gross deformities  Pulses:  Normal pulses noted Extremities: No clubbing, cyanosis, edema or deformities noted Neurological: Alert oriented x 4, grossly nonfocal Psychological:  Alert and cooperative. Anxious.    Assessment and Recommendations:  1. GERD with LA Class C esophagitis, a dilated esophagus, stenosis at lap band site, erosive duodenitis and reactive erosive gastropathy.  She had several questions about her endoscopy findings, her pathology report and her  lap band.  I addressed her questions in detail to her satisfaction except for questions related to her lap band which I will defer to Dr. Hassell Done.  She has an appointment with Dr. Hassell Done on December 29.  She is advised to remain on pantoprazole 40 mg daily long term, follow standard antireflux measures long term and elevate the head of her bed 4-6 inches long term.  We reviewed that her lap band is exacerbating her reflux and her esophageal dilation could be a response to her lap band.

## 2018-01-23 DIAGNOSIS — R69 Illness, unspecified: Secondary | ICD-10-CM | POA: Diagnosis not present

## 2018-02-02 DIAGNOSIS — Z4651 Encounter for fitting and adjustment of gastric lap band: Secondary | ICD-10-CM | POA: Diagnosis not present

## 2018-02-04 ENCOUNTER — Encounter (HOSPITAL_COMMUNITY): Payer: Self-pay | Admitting: Emergency Medicine

## 2018-02-04 ENCOUNTER — Emergency Department (HOSPITAL_COMMUNITY): Payer: Medicare HMO

## 2018-02-04 ENCOUNTER — Emergency Department (HOSPITAL_COMMUNITY)
Admission: EM | Admit: 2018-02-04 | Discharge: 2018-02-04 | Disposition: A | Discharge status: Home / Self Care | Payer: Medicare HMO | Attending: Emergency Medicine | Admitting: Emergency Medicine

## 2018-02-04 ENCOUNTER — Other Ambulatory Visit: Payer: Self-pay

## 2018-02-04 DIAGNOSIS — I2510 Atherosclerotic heart disease of native coronary artery without angina pectoris: Secondary | ICD-10-CM | POA: Diagnosis not present

## 2018-02-04 DIAGNOSIS — I10 Essential (primary) hypertension: Secondary | ICD-10-CM | POA: Insufficient documentation

## 2018-02-04 DIAGNOSIS — E119 Type 2 diabetes mellitus without complications: Secondary | ICD-10-CM | POA: Diagnosis not present

## 2018-02-04 DIAGNOSIS — Z7984 Long term (current) use of oral hypoglycemic drugs: Secondary | ICD-10-CM | POA: Diagnosis not present

## 2018-02-04 DIAGNOSIS — R0789 Other chest pain: Secondary | ICD-10-CM | POA: Diagnosis not present

## 2018-02-04 DIAGNOSIS — I252 Old myocardial infarction: Secondary | ICD-10-CM | POA: Diagnosis not present

## 2018-02-04 DIAGNOSIS — R079 Chest pain, unspecified: Secondary | ICD-10-CM

## 2018-02-04 DIAGNOSIS — Z79899 Other long term (current) drug therapy: Secondary | ICD-10-CM | POA: Insufficient documentation

## 2018-02-04 LAB — BASIC METABOLIC PANEL
Anion gap: 14 (ref 5–15)
BUN: 14 mg/dL (ref 8–23)
CO2: 22 mmol/L (ref 22–32)
Calcium: 9 mg/dL (ref 8.9–10.3)
Chloride: 108 mmol/L (ref 98–111)
Creatinine, Ser: 1.31 mg/dL — ABNORMAL HIGH (ref 0.44–1.00)
GFR calc Af Amer: 48 mL/min — ABNORMAL LOW (ref >60)
GFR calc non Af Amer: 41 mL/min — ABNORMAL LOW (ref >60)
Glucose, Bld: 112 mg/dL — ABNORMAL HIGH (ref 70–99)
Potassium: 3.8 mmol/L (ref 3.5–5.1)
Sodium: 144 mmol/L (ref 135–145)

## 2018-02-04 LAB — CBC WITH DIFFERENTIAL/PLATELET
Abs Immature Granulocytes: 0.02 10*3/uL (ref 0.00–0.07)
Basophils Absolute: 0.1 10*3/uL (ref 0.0–0.1)
Basophils Relative: 1 %
Eosinophils Absolute: 0.2 10*3/uL (ref 0.0–0.5)
Eosinophils Relative: 3 %
HCT: 38.2 % (ref 36.0–46.0)
Hemoglobin: 11.5 g/dL — ABNORMAL LOW (ref 12.0–15.0)
Immature Granulocytes: 0 %
Lymphocytes Relative: 25 %
Lymphs Abs: 1.5 10*3/uL (ref 0.7–4.0)
MCH: 25.7 pg — ABNORMAL LOW (ref 26.0–34.0)
MCHC: 30.1 g/dL (ref 30.0–36.0)
MCV: 85.5 fL (ref 80.0–100.0)
Monocytes Absolute: 0.4 10*3/uL (ref 0.1–1.0)
Monocytes Relative: 7 %
Neutro Abs: 3.8 10*3/uL (ref 1.7–7.7)
Neutrophils Relative %: 64 %
Platelets: 193 10*3/uL (ref 150–400)
RBC: 4.47 MIL/uL (ref 3.87–5.11)
RDW: 14.1 % (ref 11.5–15.5)
WBC: 5.9 10*3/uL (ref 4.0–10.5)
nRBC: 0 % (ref 0.0–0.2)

## 2018-02-04 LAB — TROPONIN I
Troponin I: <0.03 ng/mL (ref <0.03)
Troponin I: <0.03 ng/mL (ref <0.03)

## 2018-02-04 NOTE — ED Notes (Signed)
Pt states she took a recent long car ride to visit family.

## 2018-02-04 NOTE — ED Triage Notes (Signed)
Per EMS: pt from home with c/o posterior CP x2 days.  Pt self-administered 1 nitro with relief along with 324 ASA.  Pt recently had her Lap Band loosened and feels the pain began soon after.  Pt denies any N/V.

## 2018-02-04 NOTE — ED Notes (Signed)
Patient verbalizes understanding of discharge instructions. Opportunity for questioning and answers were provided. Armband removed by staff, pt discharged from ED in wheelchair.  

## 2018-02-04 NOTE — ED Provider Notes (Signed)
[facility] EMERGENCY DEPARTMENT Provider Note   CSN: [id number] Arrival date & time: 02/04/18  1502     History   Chief Complaint Chief Complaint  Patient presents with  . Chest Pain    HPI Sandra Beasley is a 67 y.o. female.  HPI   [name] with CP. Onset 2d ago. Feels "in back of my chest (intrascapular region)." Waxes and wanes w/o appreciable exacerbating or relieving factors. No fever or chills. No shortness or breath. Pt had her lap band loosened shortly before her symptoms began and thinks symptoms may be from this. Hx of CAD with DES in RCA.  Past Medical History:  Diagnosis Date  . Adenomatous polyp of colon 09/2005  . CAD S/P percutaneous coronary angioplasty - DES x2 in RCA 01/02/2013; 02/2013   a. 100% RCA - Xience Xpedition DES 2.25 mm x 15 mm + 2.25 mm x 8 mm overlapping, mod- severe distal LAD and prox OM1 lesions.;b.  Myoview 02/2013: No ischemia or Infarction, Normal EF.  Marland Kitchen GERD (gastroesophageal reflux disease) 03/06/2013  . Hyperlipidemia associated with type 2 diabetes mellitus ([place])   . Hypertension   . Obesity   . STEMI (ST elevation myocardial infarction) ([place]) 01/02/13   --> PCI; EF 60 and 65%. Gr 1 DD & No regional WMA, Otherwise relatively normal.  . Type 2 diabetes mellitus with circulatory disorder ([place])   . Vitamin D deficiency     Patient Active Problem List   Diagnosis Date Noted  . Ankle swelling 08/09/2014  . Pain and swelling of left lower leg 08/09/2014  . GERD (gastroesophageal reflux disease) 03/06/2013  . Hyperlipidemia LDL goal <70 02/24/2013  . Severe obesity (BMI >= 40) ([place]) 02/19/2013  . ST segment elevation myocardial infarction (STEMI) of inferolateral wall, subsequent episode of care ([place]) 01/02/2013  . CAD S/P percutaneous coronary angioplasty - DES x2 in RCA 01/02/2013  . Essential hypertension   . Obesity   . Type 2 diabetes mellitus with other circulatory complications ([place])   . 10 cm Lapband July 2006  12/30/2011    Past Surgical History:  Procedure Laterality Date  . ABDOMINAL HYSTERECTOMY    . CARDIAC CATHETERIZATION  01/02/2013   100% mid-distal RCA.; 70-80% proximal OM, distal LAD 80%.  . CORONARY ANGIOPLASTY WITH STENT PLACEMENT  01/02/2013    PCI-RCA: Xience Xpedition 2.25 mm x 15 mm, 2.25 mm x 8 mm (2.4 mm)  . LAPAROSCOPIC GASTRIC BANDING  2006  . LAPAROSCOPIC HYSTERECTOMY  1996  . LEFT HEART CATHETERIZATION WITH CORONARY ANGIOGRAM N/A 01/02/2013   Procedure: LEFT HEART CATHETERIZATION WITH CORONARY ANGIOGRAM;  Surgeon: Leonie Man, MD;  Location: [facility] CATH LAB;  Service: Cardiovascular;  Laterality: N/A;  . TONSILLECTOMY  1966  . TRANSTHORACIC ECHOCARDIOGRAM  01/04/2013   EF 60 and 65%. Grade 1 diastolic dysfunction with no regional wall motion abnormalities. Otherwise relatively normal.     OB History   No obstetric history on file.      Home Medications    Prior to Admission medications   Medication Sig Start Date End Date Taking? Authorizing Provider  aspirin EC 81 MG tablet Take 1 tablet (81 mg total) by mouth daily. 12/14/15   Leonie Man, MD  b complex vitamins tablet Take 1 tablet by mouth daily.    [provider]  carvedilol (COREG) 6.25 MG tablet Take 1 tablet (6.25 mg total) by mouth 2 (two) times daily with a meal. 05/23/16   Leonie Man, MD  Cholecalciferol 1000 UNITS TBDP Take 1,000 Units by mouth every morning.     [provider]  Coenzyme Q10 (CO Q 10 PO) Take by mouth.    [provider]  glimepiride (AMARYL) 4 MG tablet Take 4 mg by mouth daily before breakfast.      [provider]  irbesartan (AVAPRO) 150 MG tablet Take 1 tablet (150 mg total) by mouth every morning. 08/21/13   Leonie Man, MD  metFORMIN (GLUCOPHAGE) 500 MG tablet Take 500 mg by mouth 2 (two) times daily with a meal.  10/01/10   [provider]  metroNIDAZOLE (METROCREAM) 0.75 % cream Apply topically 2 (two) times daily.  Apply to face    [provider]  nitroGLYCERIN (NITROSTAT) 0.4 MG SL tablet Place 1 tablet (0.4 mg total) under the tongue every 5 (five) minutes as needed for chest pain. 08/05/16   Leonie Man, MD  omega-3 acid ethyl esters (LOVAZA) 1 G capsule Take 1 g by mouth every morning.     [provider]  pantoprazole (PROTONIX) 40 MG tablet Take 1 tablet (40 mg total) by mouth daily. 12/14/17   Ladene Artist, MD  rosuvastatin (CRESTOR) 10 MG tablet Take 10 mg by mouth daily. 01/23/17   [provider]  triamcinolone cream (KENALOG) 0.1 % Apply 1 application topically as directed. 12/03/15   [provider]  TURMERIC CURCUMIN PO Take 1 tablet by mouth daily.    [provider]  valACYclovir (VALTREX) 1000 MG tablet TAKE 2 TABLETS TWICE A DAY (2 DOSES PER EPISODE) ORALLY 5 DAYS 01/23/17   [provider]    Family History Family History  Adopted: Yes  Problem Relation Age of Onset  . Breast cancer Maternal Aunt   . Diabetes Maternal Aunt   . Heart disease Maternal Aunt   . Diabetes Maternal Uncle   . Diabetes gravidarum Maternal Uncle   . Colon cancer Neg Hx   . Stomach cancer Neg Hx   . Rectal cancer Neg Hx     Social History Social History   Tobacco Use  . Smoking status: Never Smoker  . Smokeless tobacco: Never Used  Substance Use Topics  . Alcohol use: No  . Drug use: No     Allergies   Ace inhibitors and Statins   Review of Systems Review of Systems  All systems reviewed and negative, other than as noted in HPI.   Physical Exam Updated Vital Signs SpO2 98%   Physical Exam Vitals signs and nursing note reviewed.  Constitutional:      General: She is not in acute distress.    Appearance: She is well-developed.  HENT:     Head: Normocephalic and atraumatic.  Eyes:     General:        Right eye: No discharge.        Left eye: No discharge.     Conjunctiva/sclera: Conjunctivae normal.  Neck:      Musculoskeletal: Neck supple.  Cardiovascular:     Rate and Rhythm: Normal rate and regular rhythm.     Heart sounds: Normal heart sounds. No murmur. No friction rub. No gallop.   Pulmonary:     Effort: Pulmonary effort is normal. No respiratory distress.     Breath sounds: Normal breath sounds.  Abdominal:     General: There is no distension.     Palpations: Abdomen is soft.     Tenderness: There is no abdominal tenderness.  Musculoskeletal:  General: No tenderness.  Skin:    General: Skin is warm and dry.  Neurological:     Mental Status: She is alert.  Psychiatric:        Behavior: Behavior normal.        Thought Content: Thought content normal.      ED Treatments / Results  Labs (all labs ordered are listed, but only abnormal results are displayed) Labs Reviewed  CBC WITH DIFFERENTIAL/PLATELET - Abnormal; Notable for the following components:      Result Value   Hemoglobin 11.5 (*)    MCH 25.7 (*)    All other components within normal limits  BASIC METABOLIC PANEL - Abnormal; Notable for the following components:   Glucose, Bld 112 (*)    Creatinine, Ser 1.31 (*)    GFR calc non Af Amer 41 (*)    GFR calc Af Amer 48 (*)    All other components within normal limits  TROPONIN I  TROPONIN I    EKG EKG Interpretation  Date/Time:  Sunday February 04 2018 15:08:07 EST Ventricular Rate:  64 PR Interval:    QRS Duration: 100 QT Interval:  424 QTC Calculation: 438 R Axis:   25 Text Interpretation:  Sinus rhythm Borderline prolonged PR interval Low voltage, precordial leads Nonspecific T abnormalities, anterior leads similar to EKG from 01/03/2013 Confirmed by Aliannah Holstrom ([id number]) on 02/04/2018 3:12:14 PM Also confirmed by Georgiann Neider ([id number]), editor Cassel, Kerry ([id number])  on 02/05/2018 1:03:18 PM   Radiology No results found.   Dg Chest 2 View  Result Date: 02/04/2018 CLINICAL DATA:  Pt c/o posterior chest pain x 1 day. Hx of DM, STEMI, HTN, GERD,  AND CAD. Pt is a nonsmoker. EXAM: CHEST - 2 VIEW COMPARISON:  09/02/2004 FINDINGS: The heart is enlarged. The lungs are free of focal consolidations and pleural effusions. No pulmonary edema. There is mild midthoracic spondylosis. Note is made of numerous colonic diverticula. IMPRESSION: 1. Cardiomegaly without pulmonary edema. 2. No focal acute pulmonary abnormality. 3. Incidental note of colonic diverticulosis. Electronically Signed   By: Elizabeth  Brown M.D.   On: 02/04/2018 17:49    Procedures Procedures (including critical care time)  Medications Ordered in ED Medications - No data to display   Initial Impression / Assessment and Plan / ED Course  I have reviewed the triage vital signs and the nursing notes.  Pertinent labs & imaging results that were available during my care of the patient were reviewed by me and considered in my medical decision making (see chart for details).     67 yF with CP/back pain. Seems atypical for ACS. ED w/u fairly unremarkable. Doubt PE, dissection or other emergent process.   Final Clinical Impressions(s) / ED Diagnoses   Final diagnoses:  Chest pain, unspecified type    ED Discharge Orders    None       Virgel Manifold, MD 02/14/18 2359

## 2018-02-04 NOTE — ED Notes (Signed)
Pt stated she had some tightness in her chest. Very minimal. MD informed. MD ordered EKG. Pt refused EKG stating she thinks the chest tightness is external and from her EKG lead stickers

## 2018-02-18 NOTE — Progress Notes (Signed)
Cardiology Office Note   Date:  02/19/2018   ID:  Sandra Beasley, Sandra Beasley Jun 08, 1950, MRN [id number]  PCP:  Lawerance Cruel, MD  Cardiologist:  Dr. Ellyn Hack Chief Complaint  Patient presents with  . Coronary Artery Disease  . Chest Pain     History of Present Illness: Sandra Beasley is a 68 y.o. female who presents for ongoing assessment and management of CAD, with hx of PCI in the setting of STEMI in 12/2012. This was an inferior STEMI with 100% occlusion of the RPDA as well as moderate to severe disease in the large lateral OM and LAD beyond the major diagonal branch. The PDA was treated with 2 overlapping DES stents. The remaining lesions were evaluated with a stress test which did not reveal any ischemia.   She was last seen in the office by Dr. Ellyn Hack on 03/24/2017 and was doing well. There were no changes made in her medications or new testing planned.   She is followed by Bariatric clinic and had a lap band. She was having a lot of vomiting and food lodging in her esophagus. Saw her physician there who loosened the band. That evening she felt subscapular pain lasting several minutes after went to bed. She felt it was related to the lap band and was able to sleep. This occurred again the following night, with exact same symptoms. The following day she felt the same pain between her shoulder blades during the day and found it to be severe. She took a NTG sublingual and began to feel diaphoretic, dizzy and dyspneic. She called EMS, took two baby ASA. Taken to ER on 02/04/2018 she was ruled out for ACS and was to follow up with cardiology.   She has been followed by GI as well and was placed on PPI, after having EDG which revealed significant inflammation and irritation of the esophagus. Biopsies were negative for CA.   Past Medical History:  Diagnosis Date  . Adenomatous polyp of colon 09/2005  . CAD S/P percutaneous coronary angioplasty - DES x2 in RCA 01/02/2013; 02/2013   a. 100% RCA -  Xience Xpedition DES 2.25 mm x 15 mm + 2.25 mm x 8 mm overlapping, mod- severe distal LAD and prox OM1 lesions.;b.  Myoview 02/2013: No ischemia or Infarction, Normal EF.  Marland Kitchen GERD (gastroesophageal reflux disease) 03/06/2013  . Hyperlipidemia associated with type 2 diabetes mellitus ([name])   . Hypertension   . Obesity   . STEMI (ST elevation myocardial infarction) ([place]) 01/02/13   --> PCI; EF 60 and 65%. Gr 1 DD & No regional WMA, Otherwise relatively normal.  . Type 2 diabetes mellitus with circulatory disorder ([name])   . Vitamin D deficiency     Past Surgical History:  Procedure Laterality Date  . ABDOMINAL HYSTERECTOMY    . CARDIAC CATHETERIZATION  01/02/2013   100% mid-distal RCA.; 70-80% proximal OM, distal LAD 80%.  . CORONARY ANGIOPLASTY WITH STENT PLACEMENT  01/02/2013    PCI-RCA: Xience Xpedition 2.25 mm x 15 mm, 2.25 mm x 8 mm (2.4 mm)  . LAPAROSCOPIC GASTRIC BANDING  2006  . LAPAROSCOPIC HYSTERECTOMY  1996  . LEFT HEART CATHETERIZATION WITH CORONARY ANGIOGRAM N/A 01/02/2013   Procedure: LEFT HEART CATHETERIZATION WITH CORONARY ANGIOGRAM;  Surgeon: Leonie Man, MD;  Location: [facility] CATH LAB;  Service: Cardiovascular;  Laterality: N/A;  . TONSILLECTOMY  1966  . TRANSTHORACIC ECHOCARDIOGRAM  01/04/2013   EF 60 and 65%. Grade 1 diastolic dysfunction with no regional wall  motion abnormalities. Otherwise relatively normal.     Current Outpatient Medications  Medication Sig Dispense Refill  . aspirin EC 81 MG tablet Take 1 tablet (81 mg total) by mouth daily. 90 tablet 3  . b complex vitamins tablet Take 1 tablet by mouth daily.    . carvedilol (COREG) 6.25 MG tablet Take 1 tablet (6.25 mg total) by mouth 2 (two) times daily with a meal. 180 tablet 2  . Cholecalciferol 1000 UNITS TBDP Take 1,000 Units by mouth every morning.     . Coenzyme Q10 (CO Q 10 PO) Take by mouth.    Marland Kitchen glimepiride (AMARYL) 4 MG tablet Take 4 mg by mouth daily before breakfast.      . irbesartan (AVAPRO) 150  MG tablet Take 1 tablet (150 mg total) by mouth every morning. 30 tablet 5  . metFORMIN (GLUCOPHAGE) 500 MG tablet Take 500 mg by mouth 2 (two) times daily with a meal.     . metroNIDAZOLE (METROCREAM) 0.75 % cream Apply topically 2 (two) times daily. Apply to face    . nitroGLYCERIN (NITROSTAT) 0.4 MG SL tablet Place 1 tablet (0.4 mg total) under the tongue every 5 (five) minutes as needed for chest pain. 25 tablet 0  . omega-3 acid ethyl esters (LOVAZA) 1 G capsule Take 1 g by mouth every morning.     . pantoprazole (PROTONIX) 40 MG tablet Take 1 tablet (40 mg total) by mouth daily. 30 tablet 12  . rosuvastatin (CRESTOR) 10 MG tablet Take 10 mg by mouth daily.  4  . triamcinolone cream (KENALOG) 0.1 % Apply 1 application topically as directed.    . TURMERIC CURCUMIN PO Take 1 tablet by mouth daily.    . valACYclovir (VALTREX) 1000 MG tablet TAKE 2 TABLETS TWICE A DAY (2 DOSES PER EPISODE) ORALLY 5 DAYS  1   No current facility-administered medications for this visit.     Allergies:   Ace inhibitors and Statins    Social History:  The patient  reports that she has never smoked. She has never used smokeless tobacco. She reports that she does not drink alcohol or use drugs.   Family History:  The patient's family history includes Breast cancer in her maternal aunt; Diabetes in her maternal aunt and maternal uncle; Diabetes gravidarum in her maternal uncle; Heart disease in her maternal aunt. She was adopted.    ROS: All other systems are reviewed and negative. Unless otherwise mentioned in H&P    PHYSICAL EXAM: VS:  BP (!) 139/92   Pulse 77   Ht 5' (1.524 m)   Wt 226 lb 3.2 oz (102.6 kg)   SpO2 97%   BMI 44.18 kg/m  , BMI Body mass index is 44.18 kg/m. GEN: Well nourished, well developed, in no acute distress HEENT: normal Neck: no JVD, carotid bruits, or masses Cardiac: RRR; no murmurs, rubs, or gallops,no edema  Respiratory:  Clear to auscultation bilaterally, normal work of  breathing GI: soft, nontender, nondistended, + BS MS: no deformity or atrophy Skin: warm and dry, no rash Neuro:  Strength and sensation are intact Psych: euthymic mood, full affect   EKG:  From ER visit, NSR with non-specific T-wave abnormalities anteriorly flattening of the T-wave with slight -T-wave inversion, and T-wave flattening in the lateral leads.   Recent Labs: 02/04/2018: BUN 14; Creatinine, Ser 1.31; Hemoglobin 11.5; Platelets 193; Potassium 3.8; Sodium 144    Lipid Panel    Component Value Date/Time   CHOL 228 (H)  01/03/2013 0245   TRIG 108 01/03/2013 0245   HDL 62 01/03/2013 0245   CHOLHDL 3.7 01/03/2013 0245   VLDL 22 01/03/2013 0245   LDLCALC 144 (H) 01/03/2013 0245      Wt Readings from Last 3 Encounters:  02/19/18 226 lb 3.2 oz (102.6 kg)  02/04/18 219 lb (99.3 kg)  01/22/18 219 lb 12.8 oz (99.7 kg)     NM Stress Test Overall Impression:  Normal stress nuclear study. Hypertension at baseline with a hypertensive response to exercise.  LV Wall Motion:  NL LV Function; NL Wall Motion; EF 62%. Other studies Reviewed:   ASSESSMENT AND PLAN:  1.  CAD: Known stents to the the RPDA X 2. With residual disease in the moderate to severe disease in the large OM and LAD beyond the major diagonal branch.  She remains on BB, ARB and statin.   2. Chest pain: Uncertain if this is from ischemia or from GI source. She states that she was significantly fatigued after ER visit, but has begun to feel better. She has had no further symptoms of subscapular chest pain since being seen in the ER. Due to her known CAD and ongoing risk factors I will plan a 2 day NM stress test for evaluation of progression of CAD causing symptoms.   3. Obesity: She has lost 130 lbs with Lap Band but has had significant issues with this lately due to over tightening, GERD symptoms and food sticking in her esophagus. She is seeing both a Gi specialist and Bariatric specialist.   4.  Hyperlipidemia: Will need to be checked on follow up unless completed by PCP.    Current medicines are reviewed at length with the patient today.    Labs/ tests ordered today include: 2 Day NM stress test.   Phill Myron. West Pugh, ANP, [facility]   02/19/2018 9:17 AM    [facility] [address] Office [phone] Fax [phone]

## 2018-02-19 ENCOUNTER — Encounter: Payer: Self-pay | Admitting: Adult Health

## 2018-02-19 ENCOUNTER — Ambulatory Visit: Payer: Medicare HMO | Admitting: Adult Health

## 2018-02-19 ENCOUNTER — Encounter

## 2018-02-19 VITALS — BP 139/92 | HR 77 | Ht 60.0 in | Wt 226.2 lb

## 2018-02-19 DIAGNOSIS — I2510 Atherosclerotic heart disease of native coronary artery without angina pectoris: Secondary | ICD-10-CM | POA: Diagnosis not present

## 2018-02-19 DIAGNOSIS — Z8679 Personal history of other diseases of the circulatory system: Secondary | ICD-10-CM

## 2018-02-19 DIAGNOSIS — Z9861 Coronary angioplasty status: Secondary | ICD-10-CM

## 2018-02-19 DIAGNOSIS — K2100 Gastro-esophageal reflux disease with esophagitis, without bleeding: Secondary | ICD-10-CM

## 2018-02-19 MED ORDER — NITROGLYCERIN 0.4 MG SL SUBL: 0.4000 mg | SUBLINGUAL_TABLET | SUBLINGUAL | 1 refills | Status: AC | PRN

## 2018-02-19 NOTE — Patient Instructions (Signed)
Medication Instructions:  NO CHANGES- Your physician recommends that you continue on your current medications as directed. Please refer to the Current Medication list given to you today. If you need a refill on your cardiac medications before your next appointment, please call your pharmacy.  Labwork: NONE ORDERED  Take the provided lab slips with you to the lab for your blood draw.  When you have your labs (blood work) drawn today and your tests are completely normal, you will receive your results only by MyChart Message (if you have MyChart) -OR-  A paper copy in the mail.  If you have any lab test that is abnormal or we need to change your treatment, we will call you to review these results.  Testing/Procedures: Your physician has requested that you have an Exercise Myoview. A cardiac stress test is a cardiological test that measures the heart's ability to respond to external stress in a controlled clinical environment. The stress response is induced by exercise (exercise-treadmill). For further information please visit [email or web address][email or web address]. If you have questions or concerns about your appointment, you can call the Nuclear Lab at [phone].   Follow-Up: You will need a follow up appointment AFTER MYOVIEW.  Please call our office 2 months in advance to schedule this appointment.  You may see Glenetta Hew, MD Jory Sims, DNP, AACC  or one of the following Advanced Practice Providers on your designated Care Team: Jory Sims, DNP, AACC Rosaria Ferries, PA-C  At [facility], you and your health needs are our priority.  As part of our continuing mission to provide you with exceptional heart care, we have created designated Provider Care Teams.  These Care Teams include your primary Cardiologist (physician) and Advanced Practice Providers (APPs -  Physician Assistants and Nurse Practitioners) who all work together to provide you with the care you need, when you need it.  Thank you for  choosing [facility] at [facility]!!

## 2018-02-22 ENCOUNTER — Telehealth (HOSPITAL_COMMUNITY): Payer: Self-pay

## 2018-02-22 NOTE — Telephone Encounter (Signed)
Encounter complete. 

## 2018-02-27 ENCOUNTER — Ambulatory Visit (HOSPITAL_COMMUNITY)
Admission: RE | Admit: 2018-02-27 | Discharge: 2018-02-27 | Disposition: A | Discharge status: Home / Self Care | Payer: Medicare HMO | Source: Ambulatory Visit | Attending: Cardiology | Admitting: Cardiology

## 2018-02-27 DIAGNOSIS — I2510 Atherosclerotic heart disease of native coronary artery without angina pectoris: Secondary | ICD-10-CM | POA: Insufficient documentation

## 2018-02-27 DIAGNOSIS — Z8679 Personal history of other diseases of the circulatory system: Secondary | ICD-10-CM

## 2018-02-27 DIAGNOSIS — Z9861 Coronary angioplasty status: Secondary | ICD-10-CM | POA: Insufficient documentation

## 2018-02-28 ENCOUNTER — Ambulatory Visit (HOSPITAL_COMMUNITY): Payer: Medicare HMO

## 2018-02-28 ENCOUNTER — Ambulatory Visit (HOSPITAL_COMMUNITY)
Admission: RE | Admit: 2018-02-28 | Discharge: 2018-02-28 | Disposition: A | Discharge status: Home / Self Care | Payer: Medicare HMO | Source: Ambulatory Visit | Attending: Cardiology | Admitting: Cardiology

## 2018-02-28 DIAGNOSIS — I2510 Atherosclerotic heart disease of native coronary artery without angina pectoris: Secondary | ICD-10-CM | POA: Diagnosis not present

## 2018-02-28 DIAGNOSIS — Z8679 Personal history of other diseases of the circulatory system: Secondary | ICD-10-CM

## 2018-02-28 DIAGNOSIS — Z9861 Coronary angioplasty status: Secondary | ICD-10-CM | POA: Diagnosis not present

## 2018-02-28 MED ORDER — REGADENOSON 0.4 MG/5ML IV SOLN
0.4000 mg | Freq: Once | INTRAVENOUS | Status: AC
  Administered 2018-02-28: 0.4 mg via INTRAVENOUS

## 2018-02-28 MED ORDER — TECHNETIUM TC 99M TETROFOSMIN IV KIT
31.3000 | PACK | Freq: Once | INTRAVENOUS | Status: AC | PRN
  Administered 2018-02-28: 31.3 via INTRAVENOUS
  Filled 2018-02-28: qty 32

## 2018-03-01 ENCOUNTER — Ambulatory Visit (HOSPITAL_COMMUNITY)
Admission: RE | Admit: 2018-03-01 | Discharge: 2018-03-01 | Disposition: A | Discharge status: Home / Self Care | Payer: Medicare HMO | Source: Ambulatory Visit | Attending: Cardiovascular Disease | Admitting: Cardiovascular Disease

## 2018-03-01 MED ORDER — TECHNETIUM TC 99M TETROFOSMIN IV KIT
31.6000 | PACK | Freq: Once | INTRAVENOUS | Status: AC | PRN
  Administered 2018-03-01: 31.6 via INTRAVENOUS

## 2018-03-02 LAB — MYOCARDIAL PERFUSION IMAGING
CHL CUP NUCLEAR SSS: 5
CHL CUP RESTING HR STRESS: 74 {beats}/min
LV dias vol: 75 mL (ref 46–106)
LV sys vol: 19 mL
NUC STRESS TID: 0.8
Peak HR: 85 {beats}/min
SDS: 2
SRS: 3

## 2018-03-04 NOTE — Progress Notes (Signed)
Cardiology Office Note   Date:  03/05/2018   ID:  Khala, Tarte 1951-01-14, MRN [id number]  PCP:  Lawerance Cruel, MD  Cardiologist:  Dr. Ellyn Hack  Chief Complaint  Patient presents with  . Follow-up    STRESS RESULTS  . Chest Pain     History of Present Illness: Sandra Beasley is a 68 y.o. female who presents for  ongoing assessment and management of CAD, with hx of PCI in the setting of STEMI in 12/2012. This was an inferior STEMI with 100% occlusion of the RPDA as well as moderate to severe disease in the large lateral OM and LAD beyond the major diagonal branch. The PDA was treated with 2 overlapping DES stents. The remaining lesions were evaluated with a stress test which did not reveal any ischemia.   She has a history of a lap band and was having issues with NV and chest pain. She complained also of pain similar to her prior angina pain. She had been seen in the ED and ruled out for ACS on 02/04/2018.She has been followed by GI as well and was placed on PPI, after having EDG which revealed significant inflammation and irritation of the esophagus. Biopsies were negative for CA.   She was scheduled for a NM stress test for further evaluation of her recurrent pain.  Stress test on 03/01/2018:   The left ventricular ejection fraction is hyperdynamic (>65%).  Nuclear stress EF: 74%.  There was no ST segment deviation noted during stress.  No T wave inversion was noted during stress.  The study is normal.  This is a low risk study.   She is feeling much better, but is unhappy that she gained 15 lbs since lap band has been loosened. She has not made follow up appointment with Dr. Hassell Done yet.   Past Medical History:  Diagnosis Date  . Adenomatous polyp of colon 09/2005  . CAD S/P percutaneous coronary angioplasty - DES x2 in RCA 01/02/2013; 02/2013   a. 100% RCA - Xience Xpedition DES 2.25 mm x 15 mm + 2.25 mm x 8 mm overlapping, mod- severe distal LAD and prox OM1  lesions.;b.  Myoview 02/2013: No ischemia or Infarction, Normal EF.  Marland Kitchen GERD (gastroesophageal reflux disease) 03/06/2013  . Hyperlipidemia associated with type 2 diabetes mellitus ([place])   . Hypertension   . Obesity   . STEMI (ST elevation myocardial infarction) ([place]) 01/02/13   --> PCI; EF 60 and 65%. Gr 1 DD & No regional WMA, Otherwise relatively normal.  . Type 2 diabetes mellitus with circulatory disorder ([place])   . Vitamin D deficiency     Past Surgical History:  Procedure Laterality Date  . ABDOMINAL HYSTERECTOMY    . CARDIAC CATHETERIZATION  01/02/2013   100% mid-distal RCA.; 70-80% proximal OM, distal LAD 80%.  . CORONARY ANGIOPLASTY WITH STENT PLACEMENT  01/02/2013    PCI-RCA: Xience Xpedition 2.25 mm x 15 mm, 2.25 mm x 8 mm (2.4 mm)  . LAPAROSCOPIC GASTRIC BANDING  2006  . LAPAROSCOPIC HYSTERECTOMY  1996  . LEFT HEART CATHETERIZATION WITH CORONARY ANGIOGRAM N/A 01/02/2013   Procedure: LEFT HEART CATHETERIZATION WITH CORONARY ANGIOGRAM;  Surgeon: Leonie Man, MD;  Location: [facility] CATH LAB;  Service: Cardiovascular;  Laterality: N/A;  . TONSILLECTOMY  1966  . TRANSTHORACIC ECHOCARDIOGRAM  01/04/2013   EF 60 and 65%. Grade 1 diastolic dysfunction with no regional wall motion abnormalities. Otherwise relatively normal.     Current Outpatient Medications  Medication  Sig Dispense Refill  . aspirin EC 81 MG tablet Take 1 tablet (81 mg total) by mouth daily. 90 tablet 3  . b complex vitamins tablet Take 1 tablet by mouth daily.    . carvedilol (COREG) 6.25 MG tablet Take 1 tablet (6.25 mg total) by mouth 2 (two) times daily with a meal. 180 tablet 2  . Cholecalciferol 1000 UNITS TBDP Take 1,000 Units by mouth every morning.     . Coenzyme Q10 (CO Q 10 PO) Take by mouth.    Marland Kitchen glimepiride (AMARYL) 4 MG tablet Take 4 mg by mouth daily before breakfast.      . irbesartan (AVAPRO) 150 MG tablet Take 1 tablet (150 mg total) by mouth every morning. 30 tablet 5  . metFORMIN (GLUCOPHAGE)  500 MG tablet Take 500 mg by mouth 2 (two) times daily with a meal.     . metroNIDAZOLE (METROCREAM) 0.75 % cream Apply topically 2 (two) times daily. Apply to face    . nitroGLYCERIN (NITROSTAT) 0.4 MG SL tablet Place 1 tablet (0.4 mg total) under the tongue every 5 (five) minutes as needed for chest pain. 25 tablet 1  . omega-3 acid ethyl esters (LOVAZA) 1 G capsule Take 1 g by mouth every morning.     . pantoprazole (PROTONIX) 40 MG tablet Take 1 tablet (40 mg total) by mouth daily. 30 tablet 12  . rosuvastatin (CRESTOR) 10 MG tablet Take 10 mg by mouth daily.  4  . triamcinolone cream (KENALOG) 0.1 % Apply 1 application topically as directed.    . TURMERIC CURCUMIN PO Take 1 tablet by mouth daily.    . valACYclovir (VALTREX) 1000 MG tablet TAKE 2 TABLETS TWICE A DAY (2 DOSES PER EPISODE) ORALLY 5 DAYS  1   No current facility-administered medications for this visit.     Allergies:   Ace inhibitors and Statins    Social History:  The patient  reports that she has never smoked. She has never used smokeless tobacco. She reports that she does not drink alcohol or use drugs.   Family History:  The patient's family history includes Breast cancer in her maternal aunt; Diabetes in her maternal aunt and maternal uncle; Diabetes gravidarum in her maternal uncle; Heart disease in her maternal aunt. She was adopted.    ROS: All other systems are reviewed and negative. Unless otherwise mentioned in H&P    PHYSICAL EXAM: VS:  BP (!) 182/90 (BP Location: Left Wrist, Patient Position: Sitting)   Pulse 74   Ht 5' (1.524 m)   Wt 235 lb 9.6 oz (106.9 kg)   SpO2 98%   BMI 46.01 kg/m  , BMI Body mass index is 46.01 kg/m. GEN: Well nourished, well developed, in no acute distress HEENT: normal Neck: no JVD, carotid bruits, or masses Cardiac: RRR; no murmurs, rubs, or gallops,no edema  Respiratory:  Clear to auscultation bilaterally, normal work of breathing GI: soft, nontender, nondistended, +  BS MS: no deformity or atrophy Skin: warm and dry, no rash Neuro:  Strength and sensation are intact Psych: euthymic mood, full affect   EKG: None  Recent Labs: 02/04/2018: BUN 14; Creatinine, Ser 1.31; Hemoglobin 11.5; Platelets 193; Potassium 3.8; Sodium 144    Lipid Panel    Component Value Date/Time   CHOL 228 (H) 01/03/2013 0245   TRIG 108 01/03/2013 0245   HDL 62 01/03/2013 0245   CHOLHDL 3.7 01/03/2013 0245   VLDL 22 01/03/2013 0245   LDLCALC 144 (H) 01/03/2013  0245      Wt Readings from Last 3 Encounters:  03/05/18 235 lb 9.6 oz (106.9 kg)  02/28/18 226 lb (102.5 kg)  02/19/18 226 lb 3.2 oz (102.6 kg)      Other studies Reviewed: Stress test as above.   ASSESSMENT AND PLAN:  1.  Chest pain: Stress test was negative for ischemia and low risk. She is given reassurance that her symptoms are not cardiac ischemia related.   2. CAD: Hx of PCI in the setting of STEMI in 12/2012. This was an inferior STEMI with 100% occlusion of the RPDA as well as moderate to severe disease in the large lateral OM and LAD beyond the major diagonal branch. The PDA was treated with 2 overlapping DES stents. Continue secondary prevention.   3. LAP band in situ: She is to follow up with Bariatric surgeon for more recommendations.  Current medicines are reviewed at length with the patient today.    Labs/ tests ordered today include: None  Phill Myron. West Pugh, ANP, AACC   03/05/2018 3:00 PM    [facility] [place] [address] Office [phone] Fax [phone]

## 2018-03-05 ENCOUNTER — Ambulatory Visit: Payer: Medicare HMO | Admitting: Adult Health

## 2018-03-05 ENCOUNTER — Encounter: Payer: Self-pay | Admitting: Adult Health

## 2018-03-05 ENCOUNTER — Telehealth: Payer: Self-pay | Admitting: Gastroenterology

## 2018-03-05 VITALS — BP 182/90 | HR 74 | Ht 60.0 in | Wt 235.6 lb

## 2018-03-05 DIAGNOSIS — E7800 Pure hypercholesterolemia, unspecified: Secondary | ICD-10-CM | POA: Diagnosis not present

## 2018-03-05 DIAGNOSIS — I2510 Atherosclerotic heart disease of native coronary artery without angina pectoris: Secondary | ICD-10-CM

## 2018-03-05 DIAGNOSIS — I10 Essential (primary) hypertension: Secondary | ICD-10-CM

## 2018-03-05 NOTE — Telephone Encounter (Signed)
Patient wanted to update Dr. Fuller Plan. At EGD in November she had erosions and inflammation around LAP band.  She had the fluid removed from her LAP band and all of her GERD/abdominal pain symptoms have resolved.  She would like to stop pantoprazole.  She is advised that she can try stopping pantoprazole, but if her symptoms return she should resume daily.  She will call back for any additional questions or concerns.

## 2018-03-05 NOTE — Telephone Encounter (Signed)
Pt has some questions regarding her esophagus and medications. Pls call her.

## 2018-03-05 NOTE — Patient Instructions (Signed)
Follow-Up: You will need a follow up appointment in 12 months.  Please call our office 2 months in advance (NOV 2020) to schedule this(JAN 2021) appointment.  You may see Glenetta Hew, MD or one of the following Advanced Practice Providers on your designated Care Team:  Jory Sims, DNP, AACC  Rhonda Barrett, PA-C Jory Sims, DNP, ANP        Medication Instructions:  NO CHANGES- Your physician recommends that you continue on your current medications as directed. Please refer to the Current Medication list given to you today. If you need a refill on your cardiac medications before your next appointment, please call your pharmacy. Labwork: When you have labs (blood work) and your tests are completely normal, you will receive your results ONLY by [place] (if you have MyChart) -OR- A paper copy in the mail.  At [facility], you and your health needs are our priority.  As part of our continuing mission to provide you with exceptional heart care, we have created designated Provider Care Teams.  These Care Teams include your primary Cardiologist (physician) and Advanced Practice Providers (APPs -  Physician Assistants and Nurse Practitioners) who all work together to provide you with the care you need, when you need it.  Thank you for choosing [facility]!!

## 2018-03-08 DIAGNOSIS — Z1231 Encounter for screening mammogram for malignant neoplasm of breast: Secondary | ICD-10-CM | POA: Diagnosis not present

## 2018-03-16 DIAGNOSIS — Z4651 Encounter for fitting and adjustment of gastric lap band: Secondary | ICD-10-CM | POA: Diagnosis not present

## 2018-04-05 DIAGNOSIS — E1169 Type 2 diabetes mellitus with other specified complication: Secondary | ICD-10-CM | POA: Diagnosis not present

## 2018-04-05 DIAGNOSIS — I10 Essential (primary) hypertension: Secondary | ICD-10-CM | POA: Diagnosis not present

## 2018-04-05 DIAGNOSIS — E538 Deficiency of other specified B group vitamins: Secondary | ICD-10-CM | POA: Diagnosis not present

## 2018-04-05 DIAGNOSIS — E559 Vitamin D deficiency, unspecified: Secondary | ICD-10-CM | POA: Diagnosis not present

## 2018-04-05 DIAGNOSIS — E782 Mixed hyperlipidemia: Secondary | ICD-10-CM | POA: Diagnosis not present

## 2018-04-09 DIAGNOSIS — E559 Vitamin D deficiency, unspecified: Secondary | ICD-10-CM | POA: Diagnosis not present

## 2018-04-09 DIAGNOSIS — I10 Essential (primary) hypertension: Secondary | ICD-10-CM | POA: Diagnosis not present

## 2018-04-09 DIAGNOSIS — E782 Mixed hyperlipidemia: Secondary | ICD-10-CM | POA: Diagnosis not present

## 2018-04-09 DIAGNOSIS — Z1389 Encounter for screening for other disorder: Secondary | ICD-10-CM | POA: Diagnosis not present

## 2018-04-09 DIAGNOSIS — N183 Chronic kidney disease, stage 3 (moderate): Secondary | ICD-10-CM | POA: Diagnosis not present

## 2018-04-09 DIAGNOSIS — Z1159 Encounter for screening for other viral diseases: Secondary | ICD-10-CM | POA: Diagnosis not present

## 2018-04-09 DIAGNOSIS — Z0000 Encounter for general adult medical examination without abnormal findings: Secondary | ICD-10-CM | POA: Diagnosis not present

## 2018-04-09 DIAGNOSIS — Z1211 Encounter for screening for malignant neoplasm of colon: Secondary | ICD-10-CM | POA: Diagnosis not present

## 2018-04-09 DIAGNOSIS — E1169 Type 2 diabetes mellitus with other specified complication: Secondary | ICD-10-CM | POA: Diagnosis not present

## 2018-04-09 DIAGNOSIS — Z6841 Body Mass Index (BMI) 40.0 and over, adult: Secondary | ICD-10-CM | POA: Diagnosis not present

## 2018-05-24 DIAGNOSIS — I10 Essential (primary) hypertension: Secondary | ICD-10-CM | POA: Diagnosis not present

## 2018-05-24 DIAGNOSIS — E782 Mixed hyperlipidemia: Secondary | ICD-10-CM | POA: Diagnosis not present

## 2018-05-24 DIAGNOSIS — N183 Chronic kidney disease, stage 3 (moderate): Secondary | ICD-10-CM | POA: Diagnosis not present

## 2018-05-24 DIAGNOSIS — I252 Old myocardial infarction: Secondary | ICD-10-CM | POA: Diagnosis not present

## 2018-05-24 DIAGNOSIS — E1169 Type 2 diabetes mellitus with other specified complication: Secondary | ICD-10-CM | POA: Diagnosis not present

## 2018-06-12 DIAGNOSIS — Z4651 Encounter for fitting and adjustment of gastric lap band: Secondary | ICD-10-CM | POA: Diagnosis not present

## 2018-06-27 DIAGNOSIS — Z20828 Contact with and (suspected) exposure to other viral communicable diseases: Secondary | ICD-10-CM | POA: Diagnosis not present

## 2018-09-07 DIAGNOSIS — L723 Sebaceous cyst: Secondary | ICD-10-CM | POA: Diagnosis not present

## 2018-09-07 DIAGNOSIS — Z20828 Contact with and (suspected) exposure to other viral communicable diseases: Secondary | ICD-10-CM | POA: Diagnosis not present

## 2018-09-10 DIAGNOSIS — Z20828 Contact with and (suspected) exposure to other viral communicable diseases: Secondary | ICD-10-CM | POA: Diagnosis not present

## 2018-09-14 DIAGNOSIS — Z4651 Encounter for fitting and adjustment of gastric lap band: Secondary | ICD-10-CM | POA: Diagnosis not present

## 2018-10-11 DIAGNOSIS — I10 Essential (primary) hypertension: Secondary | ICD-10-CM | POA: Diagnosis not present

## 2018-10-11 DIAGNOSIS — Z1159 Encounter for screening for other viral diseases: Secondary | ICD-10-CM | POA: Diagnosis not present

## 2018-10-11 DIAGNOSIS — N183 Chronic kidney disease, stage 3 (moderate): Secondary | ICD-10-CM | POA: Diagnosis not present

## 2018-10-11 DIAGNOSIS — E1169 Type 2 diabetes mellitus with other specified complication: Secondary | ICD-10-CM | POA: Diagnosis not present

## 2018-10-16 DIAGNOSIS — R69 Illness, unspecified: Secondary | ICD-10-CM | POA: Diagnosis not present

## 2018-12-10 ENCOUNTER — Other Ambulatory Visit: Payer: Self-pay

## 2018-12-10 MED ORDER — PANTOPRAZOLE SODIUM 40 MG PO TBEC: 40.0000 mg | DELAYED_RELEASE_TABLET | Freq: Every day | ORAL | 0 refills | Status: DC

## 2018-12-30 ENCOUNTER — Other Ambulatory Visit: Payer: Self-pay | Admitting: Gastroenterology

## 2019-01-02 DIAGNOSIS — E1169 Type 2 diabetes mellitus with other specified complication: Secondary | ICD-10-CM | POA: Diagnosis not present

## 2019-01-02 DIAGNOSIS — E782 Mixed hyperlipidemia: Secondary | ICD-10-CM | POA: Diagnosis not present

## 2019-01-02 DIAGNOSIS — I252 Old myocardial infarction: Secondary | ICD-10-CM | POA: Diagnosis not present

## 2019-01-02 DIAGNOSIS — N183 Chronic kidney disease, stage 3 unspecified: Secondary | ICD-10-CM | POA: Diagnosis not present

## 2019-01-02 DIAGNOSIS — I10 Essential (primary) hypertension: Secondary | ICD-10-CM | POA: Diagnosis not present

## 2019-01-28 DIAGNOSIS — I252 Old myocardial infarction: Secondary | ICD-10-CM | POA: Diagnosis not present

## 2019-01-28 DIAGNOSIS — E1169 Type 2 diabetes mellitus with other specified complication: Secondary | ICD-10-CM | POA: Diagnosis not present

## 2019-01-28 DIAGNOSIS — I10 Essential (primary) hypertension: Secondary | ICD-10-CM | POA: Diagnosis not present

## 2019-01-28 DIAGNOSIS — E782 Mixed hyperlipidemia: Secondary | ICD-10-CM | POA: Diagnosis not present

## 2019-03-09 ENCOUNTER — Ambulatory Visit: Payer: Medicare HMO

## 2019-03-14 DIAGNOSIS — Z1231 Encounter for screening mammogram for malignant neoplasm of breast: Secondary | ICD-10-CM | POA: Diagnosis not present

## 2019-03-16 ENCOUNTER — Ambulatory Visit: Payer: Medicare HMO | Attending: Internal Medicine

## 2019-03-16 DIAGNOSIS — Z23 Encounter for immunization: Secondary | ICD-10-CM | POA: Insufficient documentation

## 2019-03-16 NOTE — Progress Notes (Signed)
   Covid-19 Vaccination Clinic  Name:  Sandra Beasley    MRN: [id number] DOB: 1950/04/22  03/16/2019  Sandra Beasley was observed post Covid-19 immunization for 15 minutes without incidence. She was provided with Vaccine Information Sheet and instruction to access the V-Safe system.   Sandra Beasley was instructed to call 911 with any severe reactions post vaccine: Marland Kitchen Difficulty breathing  . Swelling of your face and throat  . A fast heartbeat  . A bad rash all over your body  . Dizziness and weakness    Immunizations Administered    Name Date Dose VIS Date Route   Pfizer COVID-19 Vaccine 03/16/2019  5:38 PM 0.3 mL 01/18/2019 Intramuscular   Manufacturer: [place]   Lot: [id number]   [place]: [id number]

## 2019-03-20 ENCOUNTER — Ambulatory Visit: Payer: Medicare HMO

## 2019-04-10 ENCOUNTER — Ambulatory Visit: Payer: Medicare HMO | Attending: Internal Medicine

## 2019-04-10 DIAGNOSIS — Z23 Encounter for immunization: Secondary | ICD-10-CM | POA: Insufficient documentation

## 2019-04-10 NOTE — Progress Notes (Signed)
   Covid-19 Vaccination Clinic  Name:  Sandra Beasley    MRN: [id number] DOB: [birth year]  04/10/2019  [name] was observed post Covid-19 immunization for 15 minutes without incident. She was provided with Vaccine Information Sheet and instruction to access the V-Safe system.   [name] was instructed to call 911 with any severe reactions post vaccine: Marland Kitchen Difficulty breathing  . Swelling of face and throat  . A fast heartbeat  . A bad rash all over body  . Dizziness and weakness   Immunizations Administered    Name Date Dose VIS Date Route   Pfizer COVID-19 Vaccine 04/10/2019  2:29 PM 0.3 mL 01/18/2019 Intramuscular   Manufacturer: [place]   Lot: [id number]   [place]: [id number]

## 2019-04-15 ENCOUNTER — Telehealth (INDEPENDENT_AMBULATORY_CARE_PROVIDER_SITE_OTHER): Payer: Medicare HMO | Admitting: Cardiology

## 2019-04-15 ENCOUNTER — Encounter: Payer: Self-pay | Admitting: Cardiology

## 2019-04-15 VITALS — Ht 60.0 in | Wt 220.0 lb

## 2019-04-15 DIAGNOSIS — Z9861 Coronary angioplasty status: Secondary | ICD-10-CM | POA: Diagnosis not present

## 2019-04-15 DIAGNOSIS — I2510 Atherosclerotic heart disease of native coronary artery without angina pectoris: Secondary | ICD-10-CM | POA: Diagnosis not present

## 2019-04-15 DIAGNOSIS — E785 Hyperlipidemia, unspecified: Secondary | ICD-10-CM

## 2019-04-15 DIAGNOSIS — E1159 Type 2 diabetes mellitus with other circulatory complications: Secondary | ICD-10-CM

## 2019-04-15 DIAGNOSIS — I10 Essential (primary) hypertension: Secondary | ICD-10-CM

## 2019-04-15 NOTE — Patient Instructions (Signed)
Medication Instructions:  Continue current medications  *If you need a refill on your cardiac medications before your next appointment, please call your pharmacy*   Lab Work: None Ordered  Testing/Procedures: None ordered   Follow-Up: At [facility], you and your health needs are our priority.  As part of our continuing mission to provide you with exceptional heart care, we have created designated Provider Care Teams.  These Care Teams include your primary Cardiologist (physician) and Advanced Practice Providers (APPs -  Physician Assistants and Nurse Practitioners) who all work together to provide you with the care you need, when you need it.  We recommend signing up for the patient portal called "MyChart".  Sign up information is provided on this After Visit Summary.  MyChart is used to connect with patients for Virtual Visits (Telemedicine).  Patients are able to view lab/test results, encounter notes, upcoming appointments, etc.  Non-urgent messages can be sent to your provider as well.   To learn more about what you can do with MyChart, go to [email or web address].    Your next appointment:   1 year(s)  The format for your next appointment:   In Person  Provider:   You may see Glenetta Hew, MD or one of the following Advanced Practice Providers on your designated Care Team:    Rosaria Ferries, PA-C  Jory Sims, DNP, ANP  Cadence Kathlen Mody, NP

## 2019-04-15 NOTE — Progress Notes (Signed)
Virtual Visit via Telephone Note   This visit type was conducted due to national recommendations for restrictions regarding the COVID-19 Pandemic (e.g. social distancing) in an effort to limit this patient's exposure and mitigate transmission in our community.  Due to her co-morbid illnesses, this patient is at least at moderate risk for complications without adequate follow up.  This format is felt to be most appropriate for this patient at this time.  The patient did not have access to video technology/had technical difficulties with video requiring transitioning to audio format only (telephone).  All issues noted in this document were discussed and addressed.  No physical exam could be performed with this format.  Please refer to the patient's chart for her  consent to telehealth for [facility].   The patient was identified using 2 identifiers.  Date:  04/15/2019   ID:  Sandra Beasley, DOB [birth year], MRN [id number]  Patient Location: Home Provider Location: Home  PCP:  Lawerance Cruel, MD  Cardiologist:  Glenetta Hew, MD  Electrophysiologist:  None   Evaluation Performed:  Follow-Up Visit  Chief Complaint:  none  History of Present Illness:    Sandra Beasley is a 69 y.o. female with history of coronary disease, status post NSTEMI in 2014.  At that time she had a PDA DES.  She had residual disease in the LAD and OM that was treated medically.  In January 2020 she was seen in the office with some complaints of chest pain.  Myoview was low risk.  Ultimately was determined that her symptoms were secondary to an adjustment in her lap band.  She is contacted today for routine follow-up.  Since we saw her last she has been doing well, she denies any chest pain.  She is having no issues with her medications.  She is going to see her PCP next week, she does not have a recent blood pressure reading.  The patient does not have symptoms concerning for COVID-19 infection (fever, chills,  cough, or new shortness of breath).    Past Medical History:  Diagnosis Date  . Adenomatous polyp of colon 09/2005  . CAD S/P percutaneous coronary angioplasty - DES x2 in RCA 01/02/2013; 02/2013   a. 100% RCA - Xience Xpedition DES 2.25 mm x 15 mm + 2.25 mm x 8 mm overlapping, mod- severe distal LAD and prox OM1 lesions.;b.  Myoview 02/2013: No ischemia or Infarction, Normal EF.  Marland Kitchen GERD (gastroesophageal reflux disease) 03/06/2013  . Hyperlipidemia associated with type 2 diabetes mellitus ([place])   . Hypertension   . Obesity   . STEMI (ST elevation myocardial infarction) ([place]) 01/02/13   --> PCI; EF 60 and 65%. Gr 1 DD & No regional WMA, Otherwise relatively normal.  . Type 2 diabetes mellitus with circulatory disorder ([place])   . Vitamin D deficiency    Past Surgical History:  Procedure Laterality Date  . ABDOMINAL HYSTERECTOMY    . CARDIAC CATHETERIZATION  01/02/2013   100% mid-distal RCA.; 70-80% proximal OM, distal LAD 80%.  . CORONARY ANGIOPLASTY WITH STENT PLACEMENT  01/02/2013    PCI-RCA: Xience Xpedition 2.25 mm x 15 mm, 2.25 mm x 8 mm (2.4 mm)  . LAPAROSCOPIC GASTRIC BANDING  2006  . LAPAROSCOPIC HYSTERECTOMY  1996  . LEFT HEART CATHETERIZATION WITH CORONARY ANGIOGRAM N/A 01/02/2013   Procedure: LEFT HEART CATHETERIZATION WITH CORONARY ANGIOGRAM;  Surgeon: Leonie Man, MD;  Location: [facility] CATH LAB;  Service: Cardiovascular;  Laterality: N/A;  . TONSILLECTOMY  [place] ECHOCARDIOGRAM  01/04/2013   EF 60 and 65%. Grade 1 diastolic dysfunction with no regional wall motion abnormalities. Otherwise relatively normal.     Current Meds  Medication Sig  . aspirin EC 81 MG tablet Take 1 tablet (81 mg total) by mouth daily.  . candesartan (ATACAND) 16 MG tablet Take 16 mg by mouth daily.  . carvedilol (COREG) 6.25 MG tablet Take 1 tablet (6.25 mg total) by mouth 2 (two) times daily with a meal.  . Cholecalciferol 1000 UNITS TBDP Take 1,000 Units by mouth every morning.     Marland Kitchen glimepiride (AMARYL) 4 MG tablet Take 4 mg by mouth daily before breakfast.    . metFORMIN (GLUCOPHAGE) 500 MG tablet Take 500 mg by mouth 2 (two) times daily with a meal.   . metroNIDAZOLE (METROCREAM) 0.75 % cream Apply topically 2 (two) times daily. Apply to face  . nitroGLYCERIN (NITROSTAT) 0.4 MG SL tablet Place 1 tablet (0.4 mg total) under the tongue every 5 (five) minutes as needed for chest pain.  . pantoprazole (PROTONIX) 40 MG tablet TAKE 1 TABLET (40 MG TOTAL) BY MOUTH DAILY.  . rosuvastatin (CRESTOR) 10 MG tablet Take 10 mg by mouth daily.  Marland Kitchen triamcinolone cream (KENALOG) 0.1 % Apply 1 application topically as directed.  . valACYclovir (VALTREX) 1000 MG tablet TAKE 2 TABLETS TWICE A DAY (2 DOSES PER EPISODE) ORALLY 5 DAYS  . [DISCONTINUED] b complex vitamins tablet Take 1 tablet by mouth daily.  . [DISCONTINUED] Coenzyme Q10 (CO Q 10 PO) Take by mouth.  . [DISCONTINUED] irbesartan (AVAPRO) 150 MG tablet Take 1 tablet (150 mg total) by mouth every morning.  . [DISCONTINUED] omega-3 acid ethyl esters (LOVAZA) 1 G capsule Take 1 g by mouth every morning.   . [DISCONTINUED] TURMERIC CURCUMIN PO Take 1 tablet by mouth daily.     Allergies:   Ace inhibitors and Statins   Social History   Tobacco Use  . Smoking status: Never Smoker  . Smokeless tobacco: Never Used  Substance Use Topics  . Alcohol use: No  . Drug use: No     Family Hx: The patient's family history includes Breast cancer in her maternal aunt; Diabetes in her maternal aunt and maternal uncle; Diabetes gravidarum in her maternal uncle; Heart disease in her maternal aunt. There is no history of Colon cancer, Stomach cancer, or Rectal cancer. She was adopted.  ROS:   Please see the history of present illness.    All other systems reviewed and are negative.   Prior CV studies:   The following studies were reviewed today:  Myoview Jan 2020  Labs/Other Tests and Data Reviewed:    EKG:  An ECG dated  02/05/2018 was personally reviewed today and demonstrated:  NSR, NSST changes  Recent Labs: No results found for requested labs within last 8760 hours.   Recent Lipid Panel Lab Results  Component Value Date/Time   CHOL 228 (H) 01/03/2013 02:45 AM   TRIG 108 01/03/2013 02:45 AM   HDL 62 01/03/2013 02:45 AM   CHOLHDL 3.7 01/03/2013 02:45 AM   LDLCALC 144 (H) 01/03/2013 02:45 AM    Wt Readings from Last 3 Encounters:  04/15/19 220 lb (99.8 kg)  03/05/18 235 lb 9.6 oz (106.9 kg)  02/28/18 226 lb (102.5 kg)     Objective:    Vital Signs:  Ht 5' (1.524 m)   Wt 220 lb (99.8 kg)   BMI 42.97 kg/m    VITAL SIGNS:  reviewed  ASSESSMENT & PLAN:    CAD- H/O STEMI in 2014- PDA DES x 2, residual LAD and OM disease treated medically. Myoview low risk Jan 2020.  Obesity- S/p lap banding, her weight is coming down.  NIDDM- Followed by her PCP  HLD- Followed by PCP  HTN- No recent readings.  She will see her PCP soon for a routine check.  COVID-19 Education: The signs and symptoms of COVID-19 were discussed with the patient and how to seek care for testing (follow up with PCP or arrange E-visit).  The importance of social distancing was discussed today.  Time:   Today, I have spent 10 minutes with the patient with telehealth technology discussing the above problems.     Medication Adjustments/Labs and Tests Ordered: Current medicines are reviewed at length with the patient today.  Concerns regarding medicines are outlined above.   Tests Ordered: No orders of the defined types were placed in this encounter.   Medication Changes: No orders of the defined types were placed in this encounter.   Follow Up:  In Person Dr Ellyn Hack in one year  Signed, Kerin Ransom, Hershal Coria  04/15/2019 3:18 PM    [place]

## 2019-04-18 DIAGNOSIS — Z0000 Encounter for general adult medical examination without abnormal findings: Secondary | ICD-10-CM | POA: Diagnosis not present

## 2019-04-18 DIAGNOSIS — B009 Herpesviral infection, unspecified: Secondary | ICD-10-CM | POA: Diagnosis not present

## 2019-04-18 DIAGNOSIS — E559 Vitamin D deficiency, unspecified: Secondary | ICD-10-CM | POA: Diagnosis not present

## 2019-04-18 DIAGNOSIS — K219 Gastro-esophageal reflux disease without esophagitis: Secondary | ICD-10-CM | POA: Diagnosis not present

## 2019-04-18 DIAGNOSIS — Z9884 Bariatric surgery status: Secondary | ICD-10-CM | POA: Diagnosis not present

## 2019-04-18 DIAGNOSIS — I10 Essential (primary) hypertension: Secondary | ICD-10-CM | POA: Diagnosis not present

## 2019-04-18 DIAGNOSIS — L309 Dermatitis, unspecified: Secondary | ICD-10-CM | POA: Diagnosis not present

## 2019-04-18 DIAGNOSIS — E1169 Type 2 diabetes mellitus with other specified complication: Secondary | ICD-10-CM | POA: Diagnosis not present

## 2019-04-18 DIAGNOSIS — N183 Chronic kidney disease, stage 3 unspecified: Secondary | ICD-10-CM | POA: Diagnosis not present

## 2019-04-23 DIAGNOSIS — I10 Essential (primary) hypertension: Secondary | ICD-10-CM | POA: Diagnosis not present

## 2019-04-23 DIAGNOSIS — E782 Mixed hyperlipidemia: Secondary | ICD-10-CM | POA: Diagnosis not present

## 2019-04-23 DIAGNOSIS — I252 Old myocardial infarction: Secondary | ICD-10-CM | POA: Diagnosis not present

## 2019-04-23 DIAGNOSIS — N183 Chronic kidney disease, stage 3 unspecified: Secondary | ICD-10-CM | POA: Diagnosis not present

## 2019-04-23 DIAGNOSIS — E1169 Type 2 diabetes mellitus with other specified complication: Secondary | ICD-10-CM | POA: Diagnosis not present

## 2019-04-24 DIAGNOSIS — R69 Illness, unspecified: Secondary | ICD-10-CM | POA: Diagnosis not present

## 2019-05-21 DIAGNOSIS — R69 Illness, unspecified: Secondary | ICD-10-CM | POA: Diagnosis not present

## 2019-05-27 DIAGNOSIS — I10 Essential (primary) hypertension: Secondary | ICD-10-CM | POA: Diagnosis not present

## 2019-05-27 DIAGNOSIS — I252 Old myocardial infarction: Secondary | ICD-10-CM | POA: Diagnosis not present

## 2019-05-27 DIAGNOSIS — N183 Chronic kidney disease, stage 3 unspecified: Secondary | ICD-10-CM | POA: Diagnosis not present

## 2019-05-27 DIAGNOSIS — E1169 Type 2 diabetes mellitus with other specified complication: Secondary | ICD-10-CM | POA: Diagnosis not present

## 2019-05-27 DIAGNOSIS — E782 Mixed hyperlipidemia: Secondary | ICD-10-CM | POA: Diagnosis not present

## 2019-06-27 DIAGNOSIS — L918 Other hypertrophic disorders of the skin: Secondary | ICD-10-CM | POA: Diagnosis not present

## 2019-06-27 DIAGNOSIS — L719 Rosacea, unspecified: Secondary | ICD-10-CM | POA: Diagnosis not present

## 2019-06-28 DIAGNOSIS — N183 Chronic kidney disease, stage 3 unspecified: Secondary | ICD-10-CM | POA: Diagnosis not present

## 2019-06-28 DIAGNOSIS — E1169 Type 2 diabetes mellitus with other specified complication: Secondary | ICD-10-CM | POA: Diagnosis not present

## 2019-06-28 DIAGNOSIS — I10 Essential (primary) hypertension: Secondary | ICD-10-CM | POA: Diagnosis not present

## 2019-06-28 DIAGNOSIS — I252 Old myocardial infarction: Secondary | ICD-10-CM | POA: Diagnosis not present

## 2019-06-28 DIAGNOSIS — E782 Mixed hyperlipidemia: Secondary | ICD-10-CM | POA: Diagnosis not present

## 2019-08-02 DIAGNOSIS — E1169 Type 2 diabetes mellitus with other specified complication: Secondary | ICD-10-CM | POA: Diagnosis not present

## 2019-08-02 DIAGNOSIS — I252 Old myocardial infarction: Secondary | ICD-10-CM | POA: Diagnosis not present

## 2019-08-02 DIAGNOSIS — I10 Essential (primary) hypertension: Secondary | ICD-10-CM | POA: Diagnosis not present

## 2019-08-02 DIAGNOSIS — N183 Chronic kidney disease, stage 3 unspecified: Secondary | ICD-10-CM | POA: Diagnosis not present

## 2019-08-02 DIAGNOSIS — E782 Mixed hyperlipidemia: Secondary | ICD-10-CM | POA: Diagnosis not present

## 2019-08-09 DIAGNOSIS — H02841 Edema of right upper eyelid: Secondary | ICD-10-CM | POA: Diagnosis not present

## 2019-08-09 DIAGNOSIS — N183 Chronic kidney disease, stage 3 unspecified: Secondary | ICD-10-CM | POA: Diagnosis not present

## 2019-09-26 DIAGNOSIS — I252 Old myocardial infarction: Secondary | ICD-10-CM | POA: Diagnosis not present

## 2019-09-26 DIAGNOSIS — E782 Mixed hyperlipidemia: Secondary | ICD-10-CM | POA: Diagnosis not present

## 2019-09-26 DIAGNOSIS — N183 Chronic kidney disease, stage 3 unspecified: Secondary | ICD-10-CM | POA: Diagnosis not present

## 2019-09-26 DIAGNOSIS — E1169 Type 2 diabetes mellitus with other specified complication: Secondary | ICD-10-CM | POA: Diagnosis not present

## 2019-09-26 DIAGNOSIS — I10 Essential (primary) hypertension: Secondary | ICD-10-CM | POA: Diagnosis not present

## 2019-10-04 DIAGNOSIS — Z4651 Encounter for fitting and adjustment of gastric lap band: Secondary | ICD-10-CM | POA: Diagnosis not present

## 2019-10-08 DIAGNOSIS — Z03818 Encounter for observation for suspected exposure to other biological agents ruled out: Secondary | ICD-10-CM | POA: Diagnosis not present

## 2019-10-08 DIAGNOSIS — Z20822 Contact with and (suspected) exposure to covid-19: Secondary | ICD-10-CM | POA: Diagnosis not present

## 2019-11-05 DIAGNOSIS — R69 Illness, unspecified: Secondary | ICD-10-CM | POA: Diagnosis not present

## 2019-11-14 DIAGNOSIS — R609 Edema, unspecified: Secondary | ICD-10-CM | POA: Diagnosis not present

## 2019-11-14 DIAGNOSIS — E1169 Type 2 diabetes mellitus with other specified complication: Secondary | ICD-10-CM | POA: Diagnosis not present

## 2019-11-14 DIAGNOSIS — S60222A Contusion of left hand, initial encounter: Secondary | ICD-10-CM | POA: Diagnosis not present

## 2019-11-18 DIAGNOSIS — E782 Mixed hyperlipidemia: Secondary | ICD-10-CM | POA: Diagnosis not present

## 2019-11-18 DIAGNOSIS — I10 Essential (primary) hypertension: Secondary | ICD-10-CM | POA: Diagnosis not present

## 2019-11-18 DIAGNOSIS — N183 Chronic kidney disease, stage 3 unspecified: Secondary | ICD-10-CM | POA: Diagnosis not present

## 2019-11-18 DIAGNOSIS — I252 Old myocardial infarction: Secondary | ICD-10-CM | POA: Diagnosis not present

## 2019-11-18 DIAGNOSIS — E1169 Type 2 diabetes mellitus with other specified complication: Secondary | ICD-10-CM | POA: Diagnosis not present

## 2019-12-16 DIAGNOSIS — I252 Old myocardial infarction: Secondary | ICD-10-CM | POA: Diagnosis not present

## 2019-12-16 DIAGNOSIS — E782 Mixed hyperlipidemia: Secondary | ICD-10-CM | POA: Diagnosis not present

## 2019-12-16 DIAGNOSIS — I10 Essential (primary) hypertension: Secondary | ICD-10-CM | POA: Diagnosis not present

## 2019-12-16 DIAGNOSIS — N183 Chronic kidney disease, stage 3 unspecified: Secondary | ICD-10-CM | POA: Diagnosis not present

## 2019-12-16 DIAGNOSIS — K219 Gastro-esophageal reflux disease without esophagitis: Secondary | ICD-10-CM | POA: Diagnosis not present

## 2019-12-16 DIAGNOSIS — E1169 Type 2 diabetes mellitus with other specified complication: Secondary | ICD-10-CM | POA: Diagnosis not present

## 2019-12-27 ENCOUNTER — Encounter: Payer: Self-pay | Admitting: Podiatry

## 2019-12-27 ENCOUNTER — Other Ambulatory Visit: Payer: Self-pay

## 2019-12-27 ENCOUNTER — Ambulatory Visit: Payer: Medicare HMO | Admitting: Podiatry

## 2019-12-27 DIAGNOSIS — E1159 Type 2 diabetes mellitus with other circulatory complications: Secondary | ICD-10-CM

## 2019-12-27 DIAGNOSIS — M79675 Pain in left toe(s): Secondary | ICD-10-CM | POA: Diagnosis not present

## 2019-12-27 DIAGNOSIS — M79674 Pain in right toe(s): Secondary | ICD-10-CM

## 2019-12-27 DIAGNOSIS — B351 Tinea unguium: Secondary | ICD-10-CM

## 2019-12-27 NOTE — Patient Instructions (Addendum)
Look for urea 40% cream or ointment and apply to the thickened dry skin / calluses. This can be bought over the counter, at a pharmacy or online such as [employer].    Diabetes Mellitus and Foot Care Foot care is an important part of your health, especially when you have diabetes. Diabetes may cause you to have problems because of poor blood flow (circulation) to your feet and legs, which can cause your skin to:  Become thinner and drier.  Break more easily.  Heal more slowly.  Peel and crack. You may also have nerve damage (neuropathy) in your legs and feet, causing decreased feeling in them. This means that you may not notice minor injuries to your feet that could lead to more serious problems. Noticing and addressing any potential problems early is the best way to prevent future foot problems. How to care for your feet Foot hygiene  Wash your feet daily with warm water and mild soap. Do not use hot water. Then, pat your feet and the areas between your toes until they are completely dry. Do not soak your feet as this can dry your skin.  Trim your toenails straight across. Do not dig under them or around the cuticle. File the edges of your nails with an emery board or nail file.  Apply a moisturizing lotion or petroleum jelly to the skin on your feet and to dry, brittle toenails. Use lotion that does not contain alcohol and is unscented. Do not apply lotion between your toes. Shoes and socks  Wear clean socks or stockings every day. Make sure they are not too tight. Do not wear knee-high stockings since they may decrease blood flow to your legs.  Wear shoes that fit properly and have enough cushioning. Always look in your shoes before you put them on to be sure there are no objects inside.  To break in new shoes, wear them for just a few hours a day. This prevents injuries on your feet. Wounds, scrapes, corns, and calluses  Check your feet daily for blisters, cuts, bruises, sores, and  redness. If you cannot see the bottom of your feet, use a mirror or ask someone for help.  Do not cut corns or calluses or try to remove them with medicine.  If you find a minor scrape, cut, or break in the skin on your feet, keep it and the skin around it clean and dry. You may clean these areas with mild soap and water. Do not clean the area with peroxide, alcohol, or iodine.  If you have a wound, scrape, corn, or callus on your foot, look at it several times a day to make sure it is healing and not infected. Check for: ? Redness, swelling, or pain. ? Fluid or blood. ? Warmth. ? Pus or a bad smell. General instructions  Do not cross your legs. This may decrease blood flow to your feet.  Do not use heating pads or hot water bottles on your feet. They may burn your skin. If you have lost feeling in your feet or legs, you may not know this is happening until it is too late.  Protect your feet from hot and cold by wearing shoes, such as at the beach or on hot pavement.  Schedule a complete foot exam at least once a year (annually) or more often if you have foot problems. If you have foot problems, report any cuts, sores, or bruises to your health care provider immediately. Contact a health  care provider if:  You have a medical condition that increases your risk of infection and you have any cuts, sores, or bruises on your feet.  You have an injury that is not healing.  You have redness on your legs or feet.  You feel burning or tingling in your legs or feet.  You have pain or cramps in your legs and feet.  Your legs or feet are numb.  Your feet always feel cold.  You have pain around a toenail. Get help right away if:  You have a wound, scrape, corn, or callus on your foot and: ? You have pain, swelling, or redness that gets worse. ? You have fluid or blood coming from the wound, scrape, corn, or callus. ? Your wound, scrape, corn, or callus feels warm to the touch. ? You  have pus or a bad smell coming from the wound, scrape, corn, or callus. ? You have a fever. ? You have a red line going up your leg. Summary  Check your feet every day for cuts, sores, red spots, swelling, and blisters.  Moisturize feet and legs daily.  Wear shoes that fit properly and have enough cushioning.  If you have foot problems, report any cuts, sores, or bruises to your health care provider immediately.  Schedule a complete foot exam at least once a year (annually) or more often if you have foot problems. This information is not intended to replace advice given to you by your health care provider. Make sure you discuss any questions you have with your health care provider. Document Revised: 10/17/2018 Document Reviewed: 02/26/2016 Elsevier Patient Education  [place].

## 2019-12-30 NOTE — Progress Notes (Signed)
°  Subjective:  Patient ID: Sandra Beasley, female    DOB: 02-Mar-1950,  MRN: [id number]  Chief Complaint  Patient presents with   Nail Problem    thick painful toenails   Diabetes    A1C   8.2,  diabetic foot exam    69 y.o. female presents with the above complaint. History confirmed with patient.  She states she was in [place] for a month and wore compression socks in the plane and had a lot of swelling.  She was concerned she had a DVT but she did not.  It was gone in 3 to 4 days after that.  Objective:  Physical Exam: warm, good capillary refill, no trophic changes or ulcerative lesions, normal DP and PT pulses, normal monofilament exam, normal sensory exam and onychomycosis.  Hallux nails are painful Assessment:   1. Type 2 diabetes mellitus with other circulatory complications [facility])      Plan:  Patient was evaluated and treated and all questions answered.  Patient educated on diabetes. Discussed proper diabetic foot care and discussed risks and complications of disease. Educated patient in depth on reasons to return to the office immediately should he/she discover anything concerning or new on the feet. All questions answered. Discussed proper shoes as well.   Discussed the etiology and treatment options for the condition in detail with the patient. Educated patient on the topical and oral treatment options for mycotic nails. Recommended debridement of the nails today. Sharp and mechanical debridement performed of all painful and mycotic nails today. Nails debrided in length and thickness using a nail nipper and a mechanical burr to level of comfort. Discussed treatment options including appropriate shoe gear. Follow up as needed for painful nails.    Return in about 3 months (around 03/28/2020) for diabetic nail trim.

## 2020-02-25 DIAGNOSIS — K219 Gastro-esophageal reflux disease without esophagitis: Secondary | ICD-10-CM | POA: Diagnosis not present

## 2020-02-25 DIAGNOSIS — E782 Mixed hyperlipidemia: Secondary | ICD-10-CM | POA: Diagnosis not present

## 2020-02-25 DIAGNOSIS — E1169 Type 2 diabetes mellitus with other specified complication: Secondary | ICD-10-CM | POA: Diagnosis not present

## 2020-02-25 DIAGNOSIS — N183 Chronic kidney disease, stage 3 unspecified: Secondary | ICD-10-CM | POA: Diagnosis not present

## 2020-02-25 DIAGNOSIS — I10 Essential (primary) hypertension: Secondary | ICD-10-CM | POA: Diagnosis not present

## 2020-02-25 DIAGNOSIS — I252 Old myocardial infarction: Secondary | ICD-10-CM | POA: Diagnosis not present

## 2020-02-26 DIAGNOSIS — Z20822 Contact with and (suspected) exposure to covid-19: Secondary | ICD-10-CM | POA: Diagnosis not present

## 2020-02-26 DIAGNOSIS — E119 Type 2 diabetes mellitus without complications: Secondary | ICD-10-CM | POA: Diagnosis not present

## 2020-02-26 DIAGNOSIS — H2513 Age-related nuclear cataract, bilateral: Secondary | ICD-10-CM | POA: Diagnosis not present

## 2020-02-26 DIAGNOSIS — Z7984 Long term (current) use of oral hypoglycemic drugs: Secondary | ICD-10-CM | POA: Diagnosis not present

## 2020-03-16 DIAGNOSIS — N183 Chronic kidney disease, stage 3 unspecified: Secondary | ICD-10-CM | POA: Diagnosis not present

## 2020-03-16 DIAGNOSIS — I252 Old myocardial infarction: Secondary | ICD-10-CM | POA: Diagnosis not present

## 2020-03-16 DIAGNOSIS — E1169 Type 2 diabetes mellitus with other specified complication: Secondary | ICD-10-CM | POA: Diagnosis not present

## 2020-03-16 DIAGNOSIS — K219 Gastro-esophageal reflux disease without esophagitis: Secondary | ICD-10-CM | POA: Diagnosis not present

## 2020-03-16 DIAGNOSIS — E782 Mixed hyperlipidemia: Secondary | ICD-10-CM | POA: Diagnosis not present

## 2020-03-16 DIAGNOSIS — I10 Essential (primary) hypertension: Secondary | ICD-10-CM | POA: Diagnosis not present

## 2020-03-18 DIAGNOSIS — Z1231 Encounter for screening mammogram for malignant neoplasm of breast: Secondary | ICD-10-CM | POA: Diagnosis not present

## 2020-04-10 ENCOUNTER — Ambulatory Visit: Payer: Medicare HMO | Admitting: Podiatry

## 2020-04-15 ENCOUNTER — Ambulatory Visit (INDEPENDENT_AMBULATORY_CARE_PROVIDER_SITE_OTHER): Payer: Medicare HMO | Admitting: Cardiology

## 2020-04-15 ENCOUNTER — Encounter: Payer: Self-pay | Admitting: Cardiology

## 2020-04-15 ENCOUNTER — Other Ambulatory Visit: Payer: Self-pay

## 2020-04-15 VITALS — BP 140/100 | HR 70 | Ht 62.0 in | Wt 241.0 lb

## 2020-04-15 DIAGNOSIS — I2119 ST elevation (STEMI) myocardial infarction involving other coronary artery of inferior wall: Secondary | ICD-10-CM | POA: Diagnosis not present

## 2020-04-15 DIAGNOSIS — I2510 Atherosclerotic heart disease of native coronary artery without angina pectoris: Secondary | ICD-10-CM

## 2020-04-15 DIAGNOSIS — M7989 Other specified soft tissue disorders: Secondary | ICD-10-CM | POA: Diagnosis not present

## 2020-04-15 DIAGNOSIS — E1169 Type 2 diabetes mellitus with other specified complication: Secondary | ICD-10-CM | POA: Diagnosis not present

## 2020-04-15 DIAGNOSIS — I10 Essential (primary) hypertension: Secondary | ICD-10-CM

## 2020-04-15 DIAGNOSIS — M6289 Other specified disorders of muscle: Secondary | ICD-10-CM

## 2020-04-15 DIAGNOSIS — Z9861 Coronary angioplasty status: Secondary | ICD-10-CM

## 2020-04-15 DIAGNOSIS — E785 Hyperlipidemia, unspecified: Secondary | ICD-10-CM

## 2020-04-15 DIAGNOSIS — E1159 Type 2 diabetes mellitus with other circulatory complications: Secondary | ICD-10-CM

## 2020-04-15 NOTE — Progress Notes (Signed)
Primary Care Provider: Lawerance Cruel, MD Cardiologist: Glenetta Hew, MD Electrophysiologist: None  Clinic Note: Chief Complaint  Patient presents with  . Follow-up    Annual follow-up, but first in person visit in 2 years  . Coronary Artery Disease    No angina or exertional dyspnea  . Leg Swelling    Significant edema during a trip to [place] in September, also notes heaviness and aching in her legs with walking.    ===================================  ASSESSMENT/PLAN   Problem List Items Addressed This Visit    Swelling of both lower extremities    Swelling is probably related to dietary changes and being on her feet all day long, however need to exclude the possibility of venous stasis.  Will check venous Dopplers.      Exercise-induced leg fatigue    I think the legs aching with walking were probably related to edema, but with her having CAD also want to exclude PAD.    Plan: Check ABIs and lower extremity arterial Dopplers along with venous reflux Dopplers.      Relevant Orders   Comprehensive metabolic panel   VAS [place] LOWER EXTREMITY VENOUS REFLUX   VAS [place] ABI WITH/WO TBI   VAS [place] LOWER EXTREMITY ARTERIAL DUPLEX   ST segment elevation myocardial infarction (STEMI) of inferolateral wall, subsequent episode of care ([place]) - Primary (Chronic)    Just over 7 years post MI with PCI to the PDA.  She had disease in the LAD and circumflex noted at that time, but has had 2 - Myoview stress test.  Her echocardiogram showed normal EF as is her stress test.  No evidence of ischemia or infarction.  No longer on DAPT, only on aspirin, statin, beta-blocker and ARB-stable regimen.      Relevant Orders   EKG 12-Lead (Completed)   Comprehensive metabolic panel   CAD S/P percutaneous coronary angioplasty - DES x2 in RCA (Chronic)    Now 7 years out from her MI.  By this time, stents are well-healed.  No longer on DAPT.  On aspirin alone.  Blood pressure is borderline  elevated today, we may need to increase either carvedilol or candesartan.  For now continue current dose and reevaluate pressures.  Continue rosuvastatin.  Is due for labs be checked.  Well within goal as of 2021.      Relevant Orders   EKG 12-Lead (Completed)   Comprehensive metabolic panel   Severe obesity (BMI >= 40) (HCC) (Chronic)    Her weight still go up and down.  She is up 20 pounds from last year.  Definite notable exam.  This could easily explain more edema.  A lot of this has to do with her LAP BAND and how much it is tightened.  Defer to her bariatric clinic and endocrinology.      Relevant Orders   VAS [place] LOWER EXTREMITY VENOUS REFLUX   VAS [place] ABI WITH/WO TBI   VAS [place] LOWER EXTREMITY ARTERIAL DUPLEX   Essential hypertension (Chronic)    She says her blood pressures at home are usually much better than they are today.  She is little worked up, and had been waiting for over an hour.  She says at home her blood pressure usually in the 1 120-130 mmHg range.  I she is reluctant to increase medications, citing that she is very stable.  Plan: I have asked that she monitor her pressures at home keep a log.  Target range would be 120-135/60-85 mmHg.  If she starts ranging above this, we will probably increase candesartan to 32 mg.  If she has more edema, we will potentially consider using diuretic such as chlorthalidone      Hyperlipidemia associated with type 2 diabetes mellitus ([place]) (Chronic)    Lipids were last checked in March 2021.  Well-controlled.  Only on low-dose rosuvastatin.  Due for labs recheck now.  Will order lipids and chemistry panel.  Her last A1c in March of last year was 11.2 that improved down to 6.8 as of October 2021.Marland Kitchen  She is closely followed by endocrinology.  With her having intermittent edema, question if she could potentially benefit from SGLT2 inhibitor-Farxiga/Jardiance.  Also with concerns for weight loss, could consider Ozempic or other GLP-1  agonist.  As it stands, she is only on sulfonylurea.      Relevant Orders   Lipid panel   Comprehensive metabolic panel   VAS [place] LOWER EXTREMITY VENOUS REFLUX   VAS [place] ABI WITH/WO TBI   VAS [place] LOWER EXTREMITY ARTERIAL DUPLEX      ===================================  HPI:    ROXANNA MCEVER is a 70 y.o. female with a PMH notable for CAD having PCI (inferior STEMI in 2014 -> 2 overlapping DES stents to distal RCA-PDA; medical management of OM1 and LAD lesions-nonischemic by Myoview in 2020), MORBID OBESITY s/p LAP BAND (initial weight loss of roughly 130 pounds, but has had significant issues with the band since), HTN, HLD, DM-2 who presents today for annual follow-up.   Inferior STEMI January 02, 2013: -> 2 overlapping Xience DES stents in the 100% PDA; mid-distal LAD beyond D1 (small caliber) 80%.  Ostial D1 40%.  Lateral OM 1 with 70-80% stenosis.  Large branching ramus intermedius with an OM and diagonal branch.  Peri-Infarct Echo: EF 60 to 65% no or WMA.  GR 1 DD.  Normal valves.  Myoview January 2015: EF 60-65%.  Normal wall motion.  No ischemia or infarction.  No longer on DAPT.  On aspirin, beta-blocker, ARB and statin.  Because of the COVID-19 lockdown initially, I have not seen her since February 2019.  She was doing very well.  Blood sugars were doing better and her blood pressure is also better.  Some improvement dyspnea which is most exertional when overeating.  No chest tightness or pressure. => No changes made.  Plan was 1 year follow-up.   Seen on February 19, 2018 by Jory Sims, NP with complaints of chest pain (ER follow-up from 02/04/2018) => 2days Myoview ordered (reviewed below) => she was seen back 2 weeks later discussed results, was not having any more chest pain.  She was just upset that she gained back 15 pounds since losing her LAP-BAND.  JUDIETH MCKOWN was last seen on April 15, 2019 via telemedicine by Kerin Ransom, PA.  Was doing well.  No major  issues.  No chest pain.  Was planning on having labs checked by PCP. => No changes  Recent Hospitalizations: None  Reviewed  CV studies:    The following studies were reviewed today: (if available, images/films reviewed: From Epic Chart or Care Everywhere) . Lexiscan Myoview March 01, 2018: EF 70 to 75%.  No EKG changes.  No evidence of ischemia or infarction.:  Interval History:   KEONI HAVEY returns here today for essentially annual follow-up from the telemedicine visit last year, over the first time I have seen her in many years.  She says she is doing quite well right now.  She was frustrated because she traveled to [place] in September and was with a friend who was insisting to be on the go the entire time.  She was having hard time keeping up with her friend both walking and with energy.  She ended up with significantly swollen lower extremities that were quite painful.  She was able to stop 1 day and put her feet up and that helped some.  Otherwise until she returned home, the legs continue to bother her.  She did have a little bit of diuretic with her that she took and it helped out some. Once she returned home and was able to get a good night sleep and change her diet back to her baseline, her swelling improved and is no longer a major issue now.  But she does have persistent leg pain with walking.  She stops and then it goes away.  She did have a lap band loosened in August prior to her trip, and she is not sure if some of the issues that she had while on her trip were related to that.  She try to do the best she could eat sensibly when she was in [place], but the food was very rich.  Interestingly, with the amount of walking she was doing, she denied any chest pain or pressure.  No dyspnea.  With the edema she had no PND orthopnea.  CV Review of Symptoms (Summary): no chest pain or dyspnea on exertion positive for - edema and Leg pain with walking, shortness of breath if she really  overdoes it.  Probably more because of obesity. negative for - chest pain, irregular heartbeat, orthopnea, palpitations, paroxysmal nocturnal dyspnea, rapid heart rate, shortness of breath or Lightheadedness dizziness, syncope/near syncope or TIA/amaurosis fugax  The patient does not have symptoms concerning for COVID-19 infection (fever, chills, cough, or new shortness of breath).   REVIEWED OF SYSTEMS   Review of Systems  Constitutional: Negative for malaise/fatigue and weight loss (She is actually gained back weight.).  HENT: Negative for congestion and nosebleeds.   Respiratory: Positive for shortness of breath (Only if she overdoes it). Negative for cough.   Cardiovascular: Positive for claudication (Leg discomfort with walking noted in HPI) and leg swelling (Per HPI).  Gastrointestinal: Negative for blood in stool and melena.  Genitourinary: Negative for hematuria.  Musculoskeletal: Positive for joint pain. Negative for falls and myalgias.  Neurological: Positive for tingling (When she had significant leg swelling, her legs did ache and burn). Negative for dizziness, seizures and weakness.  Psychiatric/Behavioral: Negative for depression and memory loss. The patient is not nervous/anxious and does not have insomnia.    I have reviewed and (if needed) personally updated the patient's problem list, medications, allergies, past medical and surgical history, social and family history.   PAST MEDICAL HISTORY   Past Medical History:  Diagnosis Date  . Adenomatous polyp of colon 09/2005  . CAD S/P percutaneous coronary angioplasty - DES x2 in RCA 01/02/2013; 02/2013   a. 100% RPDA - Xience Xpedition DES 2.25 mm x 15 mm + 2.25 mm x 8 mm overlapping, mod- severe distal LAD and prox OM1 lesions.;b.  Myoview 02/2013 & Jan 2020: No ischemia or Infarction, Normal EF.  Marland Kitchen GERD (gastroesophageal reflux disease) 03/06/2013  . Hyperlipidemia associated with type 2 diabetes mellitus ([place])   .  Hypertension   . Obesity   . STEMI (ST elevation myocardial infarction) ([place]) 01/02/13   --> PCI; EF 60 and 65%. Gr 1 DD &  No regional WMA, Otherwise relatively normal.  . Type 2 diabetes mellitus with circulatory disorder ([place])   . Vitamin D deficiency     PAST SURGICAL HISTORY   Past Surgical History:  Procedure Laterality Date  . ABDOMINAL HYSTERECTOMY    . CORONARY ANGIOPLASTY WITH STENT PLACEMENT  01/02/2013    PCI-RCA: Xience Xpedition 2.25 mm x 15 mm, 2.25 mm x 8 mm (2.4 mm)  . LAPAROSCOPIC GASTRIC BANDING  2006  . LAPAROSCOPIC HYSTERECTOMY  1996  . LEFT HEART CATHETERIZATION WITH CORONARY ANGIOGRAM N/A 01/02/2013   Procedure: LEFT HEART CATHETERIZATION WITH CORONARY ANGIOGRAM;  Surgeon: Leonie Man, MD;  Location[facility] C[facility] LAB;: Inferior STEMI: 100% PDA; mid-distal LAD beyond D1 (small caliber) 80%.  Ostial D1 40%.  Lateral OM 1 with 70-80% stenosis.  Large branching ramus intermedius with OM and DIAG branch branches.  Marland Kitchen NM MYOVIEW LTD  01/'15; 1/'20   a) EF 60-65%.  Normal wall motion.  No ischemia or infarction.; b) EF 70 to 75%.  No EKG changes.  No evidence of ischemia or infarction.:  . TONSILLECTOMY  1966  . TRANSTHORACIC ECHOCARDIOGRAM  01/04/2013   (Post inferior STEMI) EF 60 and 65%. Grade 1 diastolic dysfunction with no regional wall motion abnormalities. Otherwise relatively normal.     Immunization History  Administered Date(s) Administered  . PFIZER(Purple Top)SARS-COV-2 Vaccination 03/16/2019, 04/10/2019    MEDICATIONS/ALLERGIES   Current Meds  Medication Sig  . aspirin EC 81 MG tablet Take 1 tablet (81 mg total) by mouth daily.  . candesartan (ATACAND) 16 MG tablet Take 16 mg by mouth daily.  . carvedilol (COREG) 6.25 MG tablet Take 1 tablet (6.25 mg total) by mouth 2 (two) times daily with a meal.  . Cholecalciferol 1000 UNITS TBDP Take 1,000 Units by mouth every morning.   Marland Kitchen glimepiride (AMARYL) 4 MG tablet Take 4 mg by mouth daily before breakfast.   . metFORMIN (GLUCOPHAGE) 500 MG tablet Take 500 mg by mouth 2 (two) times daily with a meal.   . metroNIDAZOLE (METROCREAM) 0.75 % cream Apply topically 2 (two) times daily. Apply to face  . nitroGLYCERIN (NITROSTAT) 0.4 MG SL tablet Place 1 tablet (0.4 mg total) under the tongue every 5 (five) minutes as needed for chest pain.  . pantoprazole (PROTONIX) 40 MG tablet TAKE 1 TABLET (40 MG TOTAL) BY MOUTH DAILY.  . rosuvastatin (CRESTOR) 10 MG tablet Take 10 mg by mouth daily.  Marland Kitchen triamcinolone cream (KENALOG) 0.1 % Apply 1 application topically as directed.  . valACYclovir (VALTREX) 1000 MG tablet TAKE 2 TABLETS TWICE A DAY (2 DOSES PER EPISODE) ORALLY 5 DAYS    Allergies  Allergen Reactions  . Ace Inhibitors Cough  . Statins     Can take Crestor    SOCIAL HISTORY/FAMILY HISTORY   Reviewed in Epic:  Pertinent findings:  Social History   Tobacco Use  . Smoking status: Never Smoker  . Smokeless tobacco: Never Used  Substance Use Topics  . Alcohol use: No  . Drug use: No   Social History   Social History Narrative   She is a divorced nonsmoker who works for [employer]. She is currently now in cardiac rehabilitation. Has changed her dietary habits to a Vegan lifestyle.    OBJCTIVE -PE, EKG, labs   Wt Readings from Last 3 Encounters:  04/15/20 241 lb (109.3 kg)  04/15/19 220 lb (99.8 kg)  03/05/18 235 lb 9.6 oz (106.9 kg)    Physical Exam: BP (!) 140/100  Pulse 70   Ht 5\' 2"  (1.575 m)   Wt 241 lb (109.3 kg)   SpO2 99%   BMI 44.08 kg/m   Recheck blood pressure was 138/90. Physical Exam Vitals reviewed.  Constitutional:      General: She is not in acute distress.    Appearance: She is not ill-appearing or toxic-appearing.     Comments: Morbidly obese.  Otherwise well-groomed and healthy-appearing.  HENT:     Head: Normocephalic and atraumatic.  Neck:     Vascular: No carotid bruit, hepatojugular reflux or JVD.  Cardiovascular:     Rate and Rhythm: Normal  rate and regular rhythm. Occasional extrasystoles are present.    Chest Wall: PMI is not displaced.     Pulses: Decreased pulses (Diminished pulses, but may just be because of body habitus.).     Heart sounds: S1 normal and S2 normal. Heart sounds are distant. No murmur heard. No friction rub. No gallop.   Pulmonary:     Effort: Pulmonary effort is normal. No respiratory distress.     Breath sounds: Normal breath sounds.  Chest:     Chest wall: No tenderness.  Musculoskeletal:        General: Normal range of motion.     Cervical back: Normal range of motion and neck supple.     Right lower leg: Edema (Trivial-1+) present.     Left lower leg: Edema (Trivial ankle 1+) present.  Skin:    General: Skin is warm and dry.  Neurological:     General: No focal deficit present.     Mental Status: She is alert and oriented to person, place, and time.     Motor: No weakness.  Psychiatric:        Mood and Affect: Mood normal.        Behavior: Behavior normal.        Thought Content: Thought content normal.        Judgment: Judgment normal.     Adult ECG Report  Rate: 70 ;  Rhythm: normal sinus rhythm and 1  AVB, nonspecific ST and T wave changes.;   Narrative Interpretation: Stable EKG.  Recent Labs: April 18, 2019 0.2 for follow-up labs with PCP):  TC 143, TG 131, HDL of 55, LDL 65; Hgb 11.2,Cr 3.8, TSH 1.4;   11/14/2019: A1c 6.8. Lab Results  Component Value Date   CHOL 228 (H) 01/03/2013   HDL 62 01/03/2013   LDLCALC 144 (H) 01/03/2013   TRIG 108 01/03/2013   CHOLHDL 3.7 01/03/2013   Lab Results  Component Value Date   CREATININE 1.31 (H) 02/04/2018   BUN 14 02/04/2018   NA 144 02/04/2018   K 3.8 02/04/2018   CL 108 02/04/2018   CO2 22 02/04/2018   CBC Latest Ref Rng & Units 02/04/2018 01/03/2013 01/02/2013  WBC 4.0 - 10.5 K/uL 5.9 8.4 7.3  Hemoglobin 12.0 - 15.0 g/dL 11.5(L) 12.9 12.9  Hematocrit 36.0 - 46.0 % 38.2 38.2 38.3  Platelets 150 - 400 K/uL 193 223 203     No results found for: TSH  ==================================================  COVID-19 Education: The signs and symptoms of COVID-19 were discussed with the patient and how to seek care for testing (follow up with PCP or arrange E-visit).   The importance of social distancing and COVID-19 vaccination was discussed today. The patient is practicing social distancing & Masking.   I spent a total of 9minutes with the patient spent in direct patient consultation.  Additional time  spent with chart review  / charting (studies, outside notes, etc): 25 min -> I have not seen her in almost 3 years.  She has been seen several times in clinic and has had at least 1 or 2 studies done that I reviewed. Total Time: 52 min  Current medicines are reviewed at length with the patient today.  (+/- concerns) none  This visit occurred during the SARS-CoV-2 public health emergency.  Safety protocols were in place, including screening questions prior to the visit, additional usage of staff PPE, and extensive cleaning of exam room while observing appropriate contact time as indicated for disinfecting solutions.  Notice: This dictation was prepared with Dragon dictation along with smaller phrase technology. Any transcriptional errors that result from this process are unintentional and may not be corrected upon review.  Patient Instructions / Medication Changes & Studies & Tests Ordered   Patient Instructions  Medication Instructions:  No changes  *If you need a refill on your cardiac medications before your next appointment, please call your pharmacy*   Lab Work:  Lipid -fasting cmp    Testing/Procedures: Will be schedule t [address] Your physician has requested that you have an ankle brachial index (ABI). During this test an ultrasound and blood pressure cuff are used to evaluate the arteries that supply the arms and legs with blood. Allow thirty minutes for this exam. There are  no restrictions or special instructions.  And Your physician has requested that you have a lower  extremity arterial duplex. This test is an ultrasound of the arteries in the legs. It looks at arterial blood flow in the legs. Allow one hour for Lower Arterial scans. There are no restrictions or special instructions  And venous reflux Your physician has requested that you have a lower extremity venous duplex - Venous reflux. This test is an ultrasound of the veins in the legs. It looks at venous blood flow that carries blood from the heart to the legs. Allow one hour for a Lower Venous exam. Allow thirty minutes for an Upper Venous exam. There are no restrictions or special instructions.      Follow-Up: At [facility], you and your health needs are our priority.  As part of our continuing mission to provide you with exceptional heart care, we have created designated Provider Care Teams.  These Care Teams include your primary Cardiologist (physician) and Advanced Practice Providers (APPs -  Physician Assistants and Nurse Practitioners) who all work together to provide you with the care you need, when you need it.     Your next appointment:   12 month(s)  The format for your next appointment:   In Person  Provider:   Glenetta Hew, MD   Other Instructions Keep eye out on blood pressure  Reading - would like your range be between [phone]- 384 - systolic     Studies Ordered:   Orders Placed This Encounter  Procedures  . Lipid panel  . Comprehensive metabolic panel  . EKG 12-Lead  . VAS [place] LOWER EXTREMITY VENOUS REFLUX  . VAS [place] ABI WITH/WO TBI  . VAS [place] LOWER EXTREMITY ARTERIAL DUPLEX     Glenetta Hew, M.D., M.S. Interventional Cardiologist   Pager # [phone] Phone # [phone] [address] [place], [place] [address]   Thank you for choosing Heart[facility] at [facility]!!

## 2020-04-15 NOTE — Patient Instructions (Addendum)
Medication Instructions:  No changes  *If you need a refill on your cardiac medications before your next appointment, please call your pharmacy*   Lab Work:  Lipid -fasting cmp    Testing/Procedures: Will be schedule t [address] Your physician has requested that you have an ankle brachial index (ABI). During this test an ultrasound and blood pressure cuff are used to evaluate the arteries that supply the arms and legs with blood. Allow thirty minutes for this exam. There are no restrictions or special instructions.  And Your physician has requested that you have a lower  extremity arterial duplex. This test is an ultrasound of the arteries in the legs. It looks at arterial blood flow in the legs. Allow one hour for Lower Arterial scans. There are no restrictions or special instructions  And venous reflux Your physician has requested that you have a lower extremity venous duplex - Venous reflux. This test is an ultrasound of the veins in the legs. It looks at venous blood flow that carries blood from the heart to the legs. Allow one hour for a Lower Venous exam. Allow thirty minutes for an Upper Venous exam. There are no restrictions or special instructions.      Follow-Up: At [facility], you and your health needs are our priority.  As part of our continuing mission to provide you with exceptional heart care, we have created designated Provider Care Teams.  These Care Teams include your primary Cardiologist (physician) and Advanced Practice Providers (APPs -  Physician Assistants and Nurse Practitioners) who all work together to provide you with the care you need, when you need it.     Your next appointment:   12 month(s)  The format for your next appointment:   In Person  Provider:   Glenetta Hew, MD   Other Instructions Keep eye out on blood pressure  Reading - would like your range be between 517- 001 - systolic

## 2020-04-17 ENCOUNTER — Encounter: Payer: Self-pay | Admitting: Cardiology

## 2020-04-17 NOTE — Assessment & Plan Note (Addendum)
I think the legs aching with walking were probably related to edema, but with her having CAD also want to exclude PAD.    Plan: Check ABIs and lower extremity arterial Dopplers along with venous reflux Dopplers.

## 2020-04-17 NOTE — Assessment & Plan Note (Signed)
She says her blood pressures at home are usually much better than they are today.  She is little worked up, and had been waiting for over an hour.  She says at home her blood pressure usually in the 1 120-130 mmHg range.  I she is reluctant to increase medications, citing that she is very stable.  Plan: I have asked that she monitor her pressures at home keep a log.  Target range would be 120-135/60-85 mmHg.  If she starts ranging above this, we will probably increase candesartan to 32 mg.  If she has more edema, we will potentially consider using diuretic such as chlorthalidone

## 2020-04-17 NOTE — Assessment & Plan Note (Signed)
Her weight still go up and down.  She is up 20 pounds from last year.  Definite notable exam.  This could easily explain more edema.  A lot of this has to do with her LAP BAND and how much it is tightened.  Defer to her bariatric clinic and endocrinology.

## 2020-04-17 NOTE — Assessment & Plan Note (Signed)
Just over 7 years post MI with PCI to the PDA.  She had disease in the LAD and circumflex noted at that time, but has had 2 - Myoview stress test.  Her echocardiogram showed normal EF as is her stress test.  No evidence of ischemia or infarction.  No longer on DAPT, only on aspirin, statin, beta-blocker and ARB-stable regimen.

## 2020-04-17 NOTE — Assessment & Plan Note (Signed)
Now 7 years out from her MI.  By this time, stents are well-healed.  No longer on DAPT.  On aspirin alone.  Blood pressure is borderline elevated today, we may need to increase either carvedilol or candesartan.  For now continue current dose and reevaluate pressures.  Continue rosuvastatin.  Is due for labs be checked.  Well within goal as of 2021.

## 2020-04-17 NOTE — Assessment & Plan Note (Signed)
Lipids were last checked in March 2021.  Well-controlled.  Only on low-dose rosuvastatin.  Due for labs recheck now.  Will order lipids and chemistry panel.  Her last A1c in March of last year was 11.2 that improved down to 6.8 as of October 2021.Sandra Beasley  She is closely followed by endocrinology.  With her having intermittent edema, question if she could potentially benefit from SGLT2 inhibitor-Farxiga/Jardiance.  Also with concerns for weight loss, could consider Ozempic or other GLP-1 agonist.  As it stands, she is only on sulfonylurea.

## 2020-04-17 NOTE — Assessment & Plan Note (Deleted)
Her last A1c in March of last year was 11.2 that improved down to 6.8 as of October 2021.Marland Kitchen  She is closely followed by endocrinology.  With her having intermittent edema, question if she could potentially benefit from SGLT2 inhibitor-Farxiga/Jardiance.  Also with concerns for weight loss, could consider Ozempic or other GLP-1 agonist.  As it stands, she is only on sulfonylurea.

## 2020-04-17 NOTE — Assessment & Plan Note (Signed)
Swelling is probably related to dietary changes and being on her feet all day long, however need to exclude the possibility of venous stasis.  Will check venous Dopplers.

## 2020-04-21 DIAGNOSIS — N183 Chronic kidney disease, stage 3 unspecified: Secondary | ICD-10-CM | POA: Diagnosis not present

## 2020-04-21 DIAGNOSIS — E782 Mixed hyperlipidemia: Secondary | ICD-10-CM | POA: Diagnosis not present

## 2020-04-21 DIAGNOSIS — E1169 Type 2 diabetes mellitus with other specified complication: Secondary | ICD-10-CM | POA: Diagnosis not present

## 2020-04-21 DIAGNOSIS — E559 Vitamin D deficiency, unspecified: Secondary | ICD-10-CM | POA: Diagnosis not present

## 2020-04-21 DIAGNOSIS — K911 Postgastric surgery syndromes: Secondary | ICD-10-CM | POA: Diagnosis not present

## 2020-04-21 DIAGNOSIS — I10 Essential (primary) hypertension: Secondary | ICD-10-CM | POA: Diagnosis not present

## 2020-04-21 DIAGNOSIS — Z9884 Bariatric surgery status: Secondary | ICD-10-CM | POA: Diagnosis not present

## 2020-04-21 DIAGNOSIS — B353 Tinea pedis: Secondary | ICD-10-CM | POA: Diagnosis not present

## 2020-04-21 DIAGNOSIS — Z0000 Encounter for general adult medical examination without abnormal findings: Secondary | ICD-10-CM | POA: Diagnosis not present

## 2020-04-21 DIAGNOSIS — K219 Gastro-esophageal reflux disease without esophagitis: Secondary | ICD-10-CM | POA: Diagnosis not present

## 2020-04-22 ENCOUNTER — Other Ambulatory Visit: Payer: Self-pay | Admitting: Cardiology

## 2020-04-22 DIAGNOSIS — M6289 Other specified disorders of muscle: Secondary | ICD-10-CM

## 2020-04-22 DIAGNOSIS — R600 Localized edema: Secondary | ICD-10-CM

## 2020-04-22 DIAGNOSIS — M7989 Other specified soft tissue disorders: Secondary | ICD-10-CM

## 2020-04-23 ENCOUNTER — Ambulatory Visit: Payer: Medicare HMO | Admitting: Podiatry

## 2020-04-29 ENCOUNTER — Other Ambulatory Visit: Payer: Self-pay

## 2020-04-29 ENCOUNTER — Encounter (HOSPITAL_COMMUNITY): Payer: Medicare HMO

## 2020-04-29 ENCOUNTER — Ambulatory Visit (HOSPITAL_COMMUNITY)
Admission: RE | Admit: 2020-04-29 | Discharge: 2020-04-29 | Disposition: A | Discharge status: Home / Self Care | Payer: Medicare HMO | Source: Ambulatory Visit | Attending: Cardiology | Admitting: Cardiology

## 2020-04-29 DIAGNOSIS — R600 Localized edema: Secondary | ICD-10-CM

## 2020-04-29 DIAGNOSIS — M6289 Other specified disorders of muscle: Secondary | ICD-10-CM | POA: Diagnosis not present

## 2020-04-29 DIAGNOSIS — M7989 Other specified soft tissue disorders: Secondary | ICD-10-CM | POA: Diagnosis not present

## 2020-05-01 DIAGNOSIS — E538 Deficiency of other specified B group vitamins: Secondary | ICD-10-CM | POA: Diagnosis not present

## 2020-05-01 DIAGNOSIS — Z4651 Encounter for fitting and adjustment of gastric lap band: Secondary | ICD-10-CM | POA: Diagnosis not present

## 2020-05-04 ENCOUNTER — Ambulatory Visit (INDEPENDENT_AMBULATORY_CARE_PROVIDER_SITE_OTHER): Payer: Medicare HMO | Admitting: Podiatry

## 2020-05-04 ENCOUNTER — Other Ambulatory Visit: Payer: Self-pay

## 2020-05-04 DIAGNOSIS — L84 Corns and callosities: Secondary | ICD-10-CM | POA: Diagnosis not present

## 2020-05-04 DIAGNOSIS — E1159 Type 2 diabetes mellitus with other circulatory complications: Secondary | ICD-10-CM | POA: Diagnosis not present

## 2020-05-04 NOTE — Patient Instructions (Signed)
Look for lotions or cream containing  urea 20-40% cream or ointment and apply to the thickened dry skin / calluses. This can be bought over the counter, at a pharmacy or online such as [employer]. [employer] and [employer] both have lines with urea in them

## 2020-05-05 ENCOUNTER — Encounter: Payer: Self-pay | Admitting: Podiatry

## 2020-05-05 NOTE — Progress Notes (Signed)
  Subjective:  Patient ID: Sandra Beasley, female    DOB: [birth year],  MRN: [id number]  Chief Complaint  Patient presents with  . Diabetes  . Callouses    Both great toes    70 y.o. female presents with the above complaint. History confirmed with patient.  Doing quite well  Objective:  Physical Exam: warm, good capillary refill, no trophic changes or ulcerative lesions, normal DP and PT pulses, normal monofilament exam, normal sensory exam and onychomycosis.  Pinch callus bilateral hallux Assessment:   1. Type 2 diabetes mellitus with other circulatory complications (HCC)   2. Callus of foot      Plan:  Patient was evaluated and treated and all questions answered.  Patient educated on diabetes. Discussed proper diabetic foot care and discussed risks and complications of disease. Educated patient in depth on reasons to return to the office immediately should he/she discover anything concerning or new on the feet. All questions answered. Discussed proper shoes as well.   All symptomatic hyperkeratoses were safely debrided with a sterile #15 blade to patient's level of comfort without incident. We discussed preventative and palliative care of these lesions including supportive and accommodative shoegear, padding, prefabricated and custom molded accommodative orthoses, use of a pumice stone and lotions/creams daily.     Return if symptoms worsen or fail to improve.

## 2020-05-06 ENCOUNTER — Other Ambulatory Visit: Payer: Self-pay

## 2020-05-06 ENCOUNTER — Ambulatory Visit (HOSPITAL_COMMUNITY)
Admission: RE | Admit: 2020-05-06 | Discharge: 2020-05-06 | Disposition: A | Discharge status: Home / Self Care | Payer: Medicare HMO | Source: Ambulatory Visit | Attending: Cardiovascular Disease | Admitting: Cardiovascular Disease

## 2020-05-06 ENCOUNTER — Other Ambulatory Visit: Payer: Self-pay | Admitting: Surgery

## 2020-05-06 DIAGNOSIS — M79604 Pain in right leg: Secondary | ICD-10-CM

## 2020-05-06 DIAGNOSIS — E785 Hyperlipidemia, unspecified: Secondary | ICD-10-CM | POA: Insufficient documentation

## 2020-05-06 DIAGNOSIS — E1169 Type 2 diabetes mellitus with other specified complication: Secondary | ICD-10-CM

## 2020-05-06 DIAGNOSIS — M6289 Other specified disorders of muscle: Secondary | ICD-10-CM

## 2020-05-06 DIAGNOSIS — E1159 Type 2 diabetes mellitus with other circulatory complications: Secondary | ICD-10-CM | POA: Diagnosis not present

## 2020-05-06 DIAGNOSIS — M7989 Other specified soft tissue disorders: Secondary | ICD-10-CM

## 2020-05-06 DIAGNOSIS — Z9861 Coronary angioplasty status: Secondary | ICD-10-CM | POA: Diagnosis not present

## 2020-05-06 DIAGNOSIS — I2119 ST elevation (STEMI) myocardial infarction involving other coronary artery of inferior wall: Secondary | ICD-10-CM | POA: Diagnosis not present

## 2020-05-06 DIAGNOSIS — I2510 Atherosclerotic heart disease of native coronary artery without angina pectoris: Secondary | ICD-10-CM | POA: Diagnosis not present

## 2020-05-06 DIAGNOSIS — M79605 Pain in left leg: Secondary | ICD-10-CM | POA: Diagnosis not present

## 2020-05-06 DIAGNOSIS — Z4651 Encounter for fitting and adjustment of gastric lap band: Secondary | ICD-10-CM

## 2020-05-06 LAB — COMPREHENSIVE METABOLIC PANEL
ALT: 9 IU/L (ref 0–32)
AST: 13 IU/L (ref 0–40)
Albumin/Globulin Ratio: 1.5 (ref 1.2–2.2)
Albumin: 4.1 g/dL (ref 3.7–4.7)
Alkaline Phosphatase: 71 IU/L (ref 44–121)
BUN/Creatinine Ratio: 10 — ABNORMAL LOW (ref 12–28)
BUN: 13 mg/dL (ref 8–27)
Bilirubin Total: 0.5 mg/dL (ref 0.0–1.2)
CO2: 22 mmol/L (ref 20–29)
Calcium: 8.9 mg/dL (ref 8.7–10.3)
Chloride: 105 mmol/L (ref 96–106)
Creatinine, Ser: 1.31 mg/dL — ABNORMAL HIGH (ref 0.57–1.00)
Globulin, Total: 2.7 g/dL (ref 1.5–4.5)
Glucose: 152 mg/dL — ABNORMAL HIGH (ref 65–99)
Potassium: 4.4 mmol/L (ref 3.5–5.2)
Sodium: 142 mmol/L (ref 134–144)
Total Protein: 6.8 g/dL (ref 6.0–8.5)
eGFR: 44 mL/min/{1.73_m2} — ABNORMAL LOW (ref >59)

## 2020-05-06 LAB — LIPID PANEL
Chol/HDL Ratio: 2.1 ratio (ref 0.0–4.4)
Cholesterol, Total: 151 mg/dL (ref 100–199)
HDL: 73 mg/dL (ref >39)
LDL Chol Calc (NIH): 64 mg/dL (ref 0–99)
Triglycerides: 75 mg/dL (ref 0–149)
VLDL Cholesterol Cal: 14 mg/dL (ref 5–40)

## 2020-05-12 ENCOUNTER — Encounter: Payer: Self-pay | Admitting: *Deleted

## 2020-05-18 ENCOUNTER — Other Ambulatory Visit: Payer: Self-pay | Admitting: Surgery

## 2020-05-18 ENCOUNTER — Ambulatory Visit
Admission: RE | Admit: 2020-05-18 | Discharge: 2020-05-18 | Disposition: A | Discharge status: Home / Self Care | Payer: Medicare HMO | Source: Ambulatory Visit | Attending: Surgery | Admitting: Surgery

## 2020-05-18 DIAGNOSIS — R1310 Dysphagia, unspecified: Secondary | ICD-10-CM | POA: Diagnosis not present

## 2020-05-18 DIAGNOSIS — Z4651 Encounter for fitting and adjustment of gastric lap band: Secondary | ICD-10-CM

## 2020-06-09 DIAGNOSIS — E538 Deficiency of other specified B group vitamins: Secondary | ICD-10-CM | POA: Diagnosis not present

## 2020-06-11 DIAGNOSIS — N183 Chronic kidney disease, stage 3 unspecified: Secondary | ICD-10-CM | POA: Diagnosis not present

## 2020-06-11 DIAGNOSIS — I252 Old myocardial infarction: Secondary | ICD-10-CM | POA: Diagnosis not present

## 2020-06-11 DIAGNOSIS — I10 Essential (primary) hypertension: Secondary | ICD-10-CM | POA: Diagnosis not present

## 2020-06-11 DIAGNOSIS — K219 Gastro-esophageal reflux disease without esophagitis: Secondary | ICD-10-CM | POA: Diagnosis not present

## 2020-06-11 DIAGNOSIS — E1169 Type 2 diabetes mellitus with other specified complication: Secondary | ICD-10-CM | POA: Diagnosis not present

## 2020-06-11 DIAGNOSIS — E782 Mixed hyperlipidemia: Secondary | ICD-10-CM | POA: Diagnosis not present

## 2020-07-09 DIAGNOSIS — E538 Deficiency of other specified B group vitamins: Secondary | ICD-10-CM | POA: Diagnosis not present

## 2020-08-11 DIAGNOSIS — E538 Deficiency of other specified B group vitamins: Secondary | ICD-10-CM | POA: Diagnosis not present

## 2020-08-18 ENCOUNTER — Other Ambulatory Visit: Payer: Self-pay

## 2020-08-18 ENCOUNTER — Ambulatory Visit (INDEPENDENT_AMBULATORY_CARE_PROVIDER_SITE_OTHER): Payer: Medicare HMO | Admitting: Podiatry

## 2020-08-18 DIAGNOSIS — L84 Corns and callosities: Secondary | ICD-10-CM

## 2020-08-18 DIAGNOSIS — E1159 Type 2 diabetes mellitus with other circulatory complications: Secondary | ICD-10-CM | POA: Diagnosis not present

## 2020-08-18 NOTE — Progress Notes (Signed)
  Subjective:  Patient ID: Sandra Beasley, female    DOB: [birth year],  MRN: [id number]  Chief Complaint  Patient presents with   Nail Problem    Thick painful toenails, 3 month follow up    70 y.o. female returns for follow-up with the above complaint. History confirmed with patient.  Doing quite well, the nails are doing okay she wants him checked for ingrown's and the calluses have gotten thick again  Objective:  Physical Exam: warm, good capillary refill, no trophic changes or ulcerative lesions, normal DP and PT pulses, normal monofilament exam, normal sensory exam and onychomycosis.  Pinch callus bilateral hallux.  No active ingrown Assessment:   1. Callus of foot   2. Type 2 diabetes mellitus with other circulatory complications [facility])      Plan:  Patient was evaluated and treated and all questions answered.  Patient educated on diabetes. Discussed proper diabetic foot care and discussed risks and complications of disease. Educated patient in depth on reasons to return to the office immediately should he/she discover anything concerning or new on the feet. All questions answered. Discussed proper shoes as well.   All symptomatic hyperkeratoses were safely debrided with a sterile #15 blade to patient's level of comfort without incident. We discussed preventative and palliative care of these lesions including supportive and accommodative shoegear, padding, prefabricated and custom molded accommodative orthoses, use of a pumice stone and lotions/creams daily.     No follow-ups on file.

## 2020-08-21 DIAGNOSIS — Z9884 Bariatric surgery status: Secondary | ICD-10-CM | POA: Diagnosis not present

## 2020-08-21 DIAGNOSIS — K219 Gastro-esophageal reflux disease without esophagitis: Secondary | ICD-10-CM | POA: Diagnosis not present

## 2020-08-21 DIAGNOSIS — Z4651 Encounter for fitting and adjustment of gastric lap band: Secondary | ICD-10-CM | POA: Diagnosis not present

## 2020-09-14 DIAGNOSIS — E782 Mixed hyperlipidemia: Secondary | ICD-10-CM | POA: Diagnosis not present

## 2020-09-14 DIAGNOSIS — N183 Chronic kidney disease, stage 3 unspecified: Secondary | ICD-10-CM | POA: Diagnosis not present

## 2020-09-14 DIAGNOSIS — K219 Gastro-esophageal reflux disease without esophagitis: Secondary | ICD-10-CM | POA: Diagnosis not present

## 2020-09-14 DIAGNOSIS — E1169 Type 2 diabetes mellitus with other specified complication: Secondary | ICD-10-CM | POA: Diagnosis not present

## 2020-09-14 DIAGNOSIS — I10 Essential (primary) hypertension: Secondary | ICD-10-CM | POA: Diagnosis not present

## 2020-09-14 DIAGNOSIS — E538 Deficiency of other specified B group vitamins: Secondary | ICD-10-CM | POA: Diagnosis not present

## 2020-10-14 DIAGNOSIS — E538 Deficiency of other specified B group vitamins: Secondary | ICD-10-CM | POA: Diagnosis not present

## 2020-10-14 DIAGNOSIS — I10 Essential (primary) hypertension: Secondary | ICD-10-CM | POA: Diagnosis not present

## 2020-10-14 DIAGNOSIS — D649 Anemia, unspecified: Secondary | ICD-10-CM | POA: Diagnosis not present

## 2020-10-14 DIAGNOSIS — E1169 Type 2 diabetes mellitus with other specified complication: Secondary | ICD-10-CM | POA: Diagnosis not present

## 2020-11-07 DIAGNOSIS — K449 Diaphragmatic hernia without obstruction or gangrene: Secondary | ICD-10-CM

## 2020-11-07 DIAGNOSIS — I82403 Acute embolism and thrombosis of unspecified deep veins of lower extremity, bilateral: Secondary | ICD-10-CM

## 2020-11-07 HISTORY — DX: Acute embolism and thrombosis of unspecified deep veins of lower extremity, bilateral: I82.403

## 2020-11-07 HISTORY — DX: Diaphragmatic hernia without obstruction or gangrene: K44.9

## 2020-11-09 ENCOUNTER — Emergency Department (HOSPITAL_BASED_OUTPATIENT_CLINIC_OR_DEPARTMENT_OTHER): Payer: Medicare HMO

## 2020-11-09 ENCOUNTER — Telehealth: Payer: Self-pay | Admitting: Cardiology

## 2020-11-09 ENCOUNTER — Emergency Department (HOSPITAL_BASED_OUTPATIENT_CLINIC_OR_DEPARTMENT_OTHER): Payer: Medicare HMO | Admitting: Radiology

## 2020-11-09 ENCOUNTER — Encounter (HOSPITAL_BASED_OUTPATIENT_CLINIC_OR_DEPARTMENT_OTHER): Payer: Self-pay

## 2020-11-09 ENCOUNTER — Other Ambulatory Visit: Payer: Self-pay

## 2020-11-09 ENCOUNTER — Inpatient Hospital Stay (HOSPITAL_BASED_OUTPATIENT_CLINIC_OR_DEPARTMENT_OTHER)
Admission: EM | Admit: 2020-11-09 | Discharge: 2020-11-13 | DRG: 175 | Disposition: A | Discharge status: Home / Self Care | Payer: Medicare HMO | Attending: Internal Medicine | Admitting: Internal Medicine

## 2020-11-09 DIAGNOSIS — N1832 Chronic kidney disease, stage 3b: Secondary | ICD-10-CM | POA: Diagnosis present

## 2020-11-09 DIAGNOSIS — I252 Old myocardial infarction: Secondary | ICD-10-CM

## 2020-11-09 DIAGNOSIS — N179 Acute kidney failure, unspecified: Secondary | ICD-10-CM | POA: Diagnosis not present

## 2020-11-09 DIAGNOSIS — J9601 Acute respiratory failure with hypoxia: Secondary | ICD-10-CM | POA: Diagnosis present

## 2020-11-09 DIAGNOSIS — I129 Hypertensive chronic kidney disease with stage 1 through stage 4 chronic kidney disease, or unspecified chronic kidney disease: Secondary | ICD-10-CM | POA: Diagnosis present

## 2020-11-09 DIAGNOSIS — I2609 Other pulmonary embolism with acute cor pulmonale: Secondary | ICD-10-CM | POA: Diagnosis not present

## 2020-11-09 DIAGNOSIS — I2699 Other pulmonary embolism without acute cor pulmonale: Secondary | ICD-10-CM | POA: Diagnosis present

## 2020-11-09 DIAGNOSIS — Z803 Family history of malignant neoplasm of breast: Secondary | ICD-10-CM

## 2020-11-09 DIAGNOSIS — G4700 Insomnia, unspecified: Secondary | ICD-10-CM | POA: Diagnosis present

## 2020-11-09 DIAGNOSIS — I10 Essential (primary) hypertension: Secondary | ICD-10-CM | POA: Diagnosis not present

## 2020-11-09 DIAGNOSIS — Z20822 Contact with and (suspected) exposure to covid-19: Secondary | ICD-10-CM | POA: Diagnosis present

## 2020-11-09 DIAGNOSIS — Z833 Family history of diabetes mellitus: Secondary | ICD-10-CM

## 2020-11-09 DIAGNOSIS — Z7982 Long term (current) use of aspirin: Secondary | ICD-10-CM

## 2020-11-09 DIAGNOSIS — K8020 Calculus of gallbladder without cholecystitis without obstruction: Secondary | ICD-10-CM | POA: Diagnosis not present

## 2020-11-09 DIAGNOSIS — E785 Hyperlipidemia, unspecified: Secondary | ICD-10-CM | POA: Diagnosis present

## 2020-11-09 DIAGNOSIS — I517 Cardiomegaly: Secondary | ICD-10-CM | POA: Diagnosis not present

## 2020-11-09 DIAGNOSIS — Z955 Presence of coronary angioplasty implant and graft: Secondary | ICD-10-CM

## 2020-11-09 DIAGNOSIS — Z7984 Long term (current) use of oral hypoglycemic drugs: Secondary | ICD-10-CM

## 2020-11-09 DIAGNOSIS — E1122 Type 2 diabetes mellitus with diabetic chronic kidney disease: Secondary | ICD-10-CM | POA: Diagnosis present

## 2020-11-09 DIAGNOSIS — I161 Hypertensive emergency: Secondary | ICD-10-CM | POA: Diagnosis present

## 2020-11-09 DIAGNOSIS — I2510 Atherosclerotic heart disease of native coronary artery without angina pectoris: Secondary | ICD-10-CM | POA: Diagnosis present

## 2020-11-09 DIAGNOSIS — Z6841 Body Mass Index (BMI) 40.0 and over, adult: Secondary | ICD-10-CM

## 2020-11-09 DIAGNOSIS — E1165 Type 2 diabetes mellitus with hyperglycemia: Secondary | ICD-10-CM | POA: Diagnosis present

## 2020-11-09 DIAGNOSIS — I82452 Acute embolism and thrombosis of left peroneal vein: Secondary | ICD-10-CM | POA: Diagnosis present

## 2020-11-09 DIAGNOSIS — Z8249 Family history of ischemic heart disease and other diseases of the circulatory system: Secondary | ICD-10-CM

## 2020-11-09 DIAGNOSIS — Z9884 Bariatric surgery status: Secondary | ICD-10-CM

## 2020-11-09 DIAGNOSIS — I82443 Acute embolism and thrombosis of tibial vein, bilateral: Secondary | ICD-10-CM | POA: Diagnosis present

## 2020-11-09 DIAGNOSIS — E669 Obesity, unspecified: Secondary | ICD-10-CM | POA: Diagnosis present

## 2020-11-09 DIAGNOSIS — E559 Vitamin D deficiency, unspecified: Secondary | ICD-10-CM | POA: Diagnosis present

## 2020-11-09 DIAGNOSIS — R0602 Shortness of breath: Secondary | ICD-10-CM | POA: Diagnosis not present

## 2020-11-09 DIAGNOSIS — K219 Gastro-esophageal reflux disease without esophagitis: Secondary | ICD-10-CM | POA: Diagnosis present

## 2020-11-09 DIAGNOSIS — Z79899 Other long term (current) drug therapy: Secondary | ICD-10-CM

## 2020-11-09 DIAGNOSIS — Z888 Allergy status to other drugs, medicaments and biological substances status: Secondary | ICD-10-CM

## 2020-11-09 DIAGNOSIS — E1159 Type 2 diabetes mellitus with other circulatory complications: Secondary | ICD-10-CM | POA: Diagnosis present

## 2020-11-09 DIAGNOSIS — I2694 Multiple subsegmental pulmonary emboli without acute cor pulmonale: Secondary | ICD-10-CM | POA: Diagnosis not present

## 2020-11-09 HISTORY — DX: Other pulmonary embolism without acute cor pulmonale: I26.99

## 2020-11-09 LAB — BRAIN NATRIURETIC PEPTIDE: B Natriuretic Peptide: 1080.1 pg/mL — ABNORMAL HIGH (ref 0.0–100.0)

## 2020-11-09 LAB — CBC WITH DIFFERENTIAL/PLATELET
Abs Immature Granulocytes: 0.03 10*3/uL (ref 0.00–0.07)
Basophils Absolute: 0.1 10*3/uL (ref 0.0–0.1)
Basophils Relative: 1 %
Eosinophils Absolute: 0.1 10*3/uL (ref 0.0–0.5)
Eosinophils Relative: 1 %
HCT: 34.9 % — ABNORMAL LOW (ref 36.0–46.0)
Hemoglobin: 10.3 g/dL — ABNORMAL LOW (ref 12.0–15.0)
Immature Granulocytes: 0 %
Lymphocytes Relative: 17 %
Lymphs Abs: 1.3 10*3/uL (ref 0.7–4.0)
MCH: 21.9 pg — ABNORMAL LOW (ref 26.0–34.0)
MCHC: 29.5 g/dL — ABNORMAL LOW (ref 30.0–36.0)
MCV: 74.3 fL — ABNORMAL LOW (ref 80.0–100.0)
Monocytes Absolute: 0.7 10*3/uL (ref 0.1–1.0)
Monocytes Relative: 9 %
Neutro Abs: 5.5 10*3/uL (ref 1.7–7.7)
Neutrophils Relative %: 72 %
Platelets: 218 10*3/uL (ref 150–400)
RBC: 4.7 MIL/uL (ref 3.87–5.11)
RDW: 17.2 % — ABNORMAL HIGH (ref 11.5–15.5)
WBC: 7.6 10*3/uL (ref 4.0–10.5)
nRBC: 0 % (ref 0.0–0.2)

## 2020-11-09 LAB — TROPONIN I (HIGH SENSITIVITY)
Troponin I (High Sensitivity): 75 ng/L — ABNORMAL HIGH (ref <18)
Troponin I (High Sensitivity): 81 ng/L — ABNORMAL HIGH (ref <18)

## 2020-11-09 LAB — BASIC METABOLIC PANEL
Anion gap: 13 (ref 5–15)
BUN: 25 mg/dL — ABNORMAL HIGH (ref 8–23)
CO2: 20 mmol/L — ABNORMAL LOW (ref 22–32)
Calcium: 8.8 mg/dL — ABNORMAL LOW (ref 8.9–10.3)
Chloride: 106 mmol/L (ref 98–111)
Creatinine, Ser: 1.45 mg/dL — ABNORMAL HIGH (ref 0.44–1.00)
GFR, Estimated: 38 mL/min — ABNORMAL LOW (ref >60)
Glucose, Bld: 240 mg/dL — ABNORMAL HIGH (ref 70–99)
Potassium: 3.5 mmol/L (ref 3.5–5.1)
Sodium: 139 mmol/L (ref 135–145)

## 2020-11-09 LAB — PROTIME-INR
INR: 1.1 (ref 0.8–1.2)
Prothrombin Time: 14.1 seconds (ref 11.4–15.2)

## 2020-11-09 LAB — RESP PANEL BY RT-PCR (FLU A&B, COVID) ARPGX2
Influenza A by PCR: NEGATIVE
Influenza B by PCR: NEGATIVE
SARS Coronavirus 2 by RT PCR: NEGATIVE

## 2020-11-09 LAB — D-DIMER, QUANTITATIVE: D-Dimer, Quant: 2.48 ug/mL-FEU — ABNORMAL HIGH (ref 0.00–0.50)

## 2020-11-09 MED ORDER — HEPARIN (PORCINE) 25000 UT/250ML-% IV SOLN
1350.0000 [IU]/h | INTRAVENOUS | Status: DC
  Administered 2020-11-09: 1350 [IU]/h via INTRAVENOUS
  Filled 2020-11-09: qty 250

## 2020-11-09 MED ORDER — IOHEXOL 350 MG/ML SOLN
50.0000 mL | Freq: Once | INTRAVENOUS | Status: AC | PRN
  Administered 2020-11-09: 45 mL via INTRAVENOUS

## 2020-11-09 MED ORDER — HEPARIN (PORCINE) 25000 UT/250ML-% IV SOLN: 14.0000 [IU]/kg/h | INTRAVENOUS | Status: DC

## 2020-11-09 MED ORDER — HEPARIN SODIUM (PORCINE) 5000 UNIT/ML IJ SOLN: 4000.0000 [IU] | Freq: Once | INTRAMUSCULAR | Status: DC

## 2020-11-09 MED ORDER — HEPARIN BOLUS VIA INFUSION
5000.0000 [IU] | Freq: Once | INTRAVENOUS | Status: AC
  Administered 2020-11-09: 5000 [IU] via INTRAVENOUS

## 2020-11-09 MED ORDER — FUROSEMIDE 10 MG/ML IJ SOLN
40.0000 mg | Freq: Once | INTRAMUSCULAR | Status: AC
  Administered 2020-11-09: 40 mg via INTRAVENOUS
  Filled 2020-11-09: qty 4

## 2020-11-09 NOTE — Progress Notes (Deleted)
Cardiology Office Note:    Date:  11/09/2020   ID:  ALGA SOUTHALL, DOB 04/03/1950, MRN [id number]  PCP:  Lawerance Cruel, MD  Cardiologist:  Glenetta Hew, MD   Referring MD: Lawerance Cruel, MD   No chief complaint on file. ***  History of Present Illness:    Sandra Beasley is a 70 y.o. female with a hx of CAD s/p STEMI with DES x 2 to RCA, DM, HTN,    Patient called our office with complaints of shortness of breath.  She was diagnosed with anemia about 1 month ago and was started on iron.  It seems that iron increase her shortness of breath and fatigue, both improved when she temporarily held iron.  However she has experienced worsening symptoms and she was added to my schedule.  Upon review of her chart, she reported to the ER and was diagnosed with acute pulmonary embolus.   Past Medical History:  Diagnosis Date   Adenomatous polyp of colon 09/2005   CAD S/P percutaneous coronary angioplasty - DES x2 in RCA 01/02/2013; 02/2013   a. 100% RPDA - Xience Xpedition DES 2.25 mm x 15 mm + 2.25 mm x 8 mm overlapping, mod- severe distal LAD and prox OM1 lesions.;b.  Myoview 02/2013 & Jan 2020: No ischemia or Infarction, Normal EF.   GERD (gastroesophageal reflux disease) 03/06/2013   Hyperlipidemia associated with type 2 diabetes mellitus ([place])    Hypertension    Obesity    STEMI (ST elevation myocardial infarction) ([place]) 01/02/13   --> PCI; EF 60 and 65%. Gr 1 DD & No regional WMA, Otherwise relatively normal.   Type 2 diabetes mellitus with circulatory disorder (HCC)    Vitamin D deficiency     Past Surgical History:  Procedure Laterality Date   ABDOMINAL HYSTERECTOMY     CORONARY ANGIOPLASTY WITH STENT PLACEMENT  01/02/2013    PCI-RCA: Xience Xpedition 2.25 mm x 15 mm, 2.25 mm x 8 mm (2.4 mm)   LAPAROSCOPIC GASTRIC BANDING  2006   LAPAROSCOPIC HYSTERECTOMY  1996   LEFT HEART CATHETERIZATION WITH CORONARY ANGIOGRAM N/A 01/02/2013   Procedure: LEFT HEART CATHETERIZATION  WITH CORONARY ANGIOGRAM;  Surgeon: Leonie Man, MD;  Location: [facility] CATH LAB;: Inferior STEMI: 100% PDA; mid-distal LAD beyond D1 (small caliber) 80%.  Ostial D1 40%.  Lateral OM 1 with 70-80% stenosis.  Large branching ramus intermedius with OM and DIAG branch branches.   NM MYOVIEW LTD  01/'15; 1/'20   a) EF 60-65%.  Normal wall motion.  No ischemia or infarction.; b) EF 70 to 75%.  No EKG changes.  No evidence of ischemia or infarction.:   TONSILLECTOMY  1966   TRANSTHORACIC ECHOCARDIOGRAM  01/04/2013   (Post inferior STEMI) EF 60 and 65%. Grade 1 diastolic dysfunction with no regional wall motion abnormalities. Otherwise relatively normal.    Current Medications: No outpatient medications have been marked as taking for the 11/10/20 encounter (Appointment) with Ledora Bottcher, [place].     Allergies:   Ace inhibitors and Statins   Social History   Socioeconomic History   Marital status: Divorced    Spouse name: Not on file   Number of children: 3   Years of education: Not on file   Highest education level: Not on file  Occupational History   Occupation: Teacher  Tobacco Use   Smoking status: Never   Smokeless tobacco: Never  Substance and Sexual Activity   Alcohol use: No   Drug  use: No   Sexual activity: Not on file  Other Topics Concern   Not on file  Social History Narrative   She is a divorced nonsmoker who works for [employer]. She is currently now in cardiac rehabilitation. Has changed her dietary habits to a Vegan lifestyle.   Social Determinants of Health   Financial Resource Strain: Not on file  Food Insecurity: Not on file  Transportation Needs: Not on file  Physical Activity: Not on file  Stress: Not on file  Social Connections: Not on file     Family History: The patient's ***family history includes Breast cancer in her maternal aunt; Diabetes in her maternal aunt and maternal uncle; Diabetes gravidarum in her maternal uncle; Heart disease in her  maternal aunt. There is no history of Colon cancer, Stomach cancer, or Rectal cancer. She was adopted.  ROS:   Please see the history of present illness.    *** All other systems reviewed and are negative.  EKGs/Labs/Other Studies Reviewed:    The following studies were reviewed today: ***  EKG:  EKG is *** ordered today.  The ekg ordered today demonstrates ***  Recent Labs: 05/06/2020: ALT 9 11/09/2020: B Natriuretic Peptide 1,080.1; BUN 25; Creatinine, Ser 1.45; Hemoglobin 10.3; Platelets 218; Potassium 3.5; Sodium 139  Recent Lipid Panel    Component Value Date/Time   CHOL 151 05/06/2020 1012   TRIG 75 05/06/2020 1012   HDL 73 05/06/2020 1012   CHOLHDL 2.1 05/06/2020 1012   CHOLHDL 3.7 01/03/2013 0245   VLDL 22 01/03/2013 0245   LDLCALC 64 05/06/2020 1012    Physical Exam:    VS:  There were no vitals taken for this visit.    Wt Readings from Last 3 Encounters:  11/09/20 230 lb (104.3 kg)  04/15/20 241 lb (109.3 kg)  04/15/19 220 lb (99.8 kg)     GEN: *** Well nourished, well developed in no acute distress HEENT: Normal NECK: No JVD; No carotid bruits LYMPHATICS: No lymphadenopathy CARDIAC: ***RRR, no murmurs, rubs, gallops RESPIRATORY:  Clear to auscultation without rales, wheezing or rhonchi  ABDOMEN: Soft, non-tender, non-distended MUSCULOSKELETAL:  No edema; No deformity  SKIN: Warm and dry NEUROLOGIC:  Alert and oriented x 3 PSYCHIATRIC:  Normal affect   ASSESSMENT:    No diagnosis found. PLAN:    In order of problems listed above:  No diagnosis found.   Medication Adjustments/Labs and Tests Ordered: Current medicines are reviewed at length with the patient today.  Concerns regarding medicines are outlined above.  No orders of the defined types were placed in this encounter.  No orders of the defined types were placed in this encounter.   Signed, Ledora Bottcher, PA  11/09/2020 10:39 PM    [facility]

## 2020-11-09 NOTE — Telephone Encounter (Signed)
Pt c/o Shortness Of Breath: STAT if SOB developed within the last 24 hours or pt is noticeably SOB on the phone  1. Are you currently SOB (can you hear that pt is SOB on the phone)? Yes   2. How long have you been experiencing SOB? A week  3. Are you SOB when sitting or when up moving around? Both   4. Are you currently experiencing any other symptoms? Tired and exhausted

## 2020-11-09 NOTE — ED Notes (Signed)
Lab will add on PT/INR

## 2020-11-09 NOTE — Telephone Encounter (Signed)
Dr Ellyn Hack spoke to Dr Leonides Schanz   Per Dr Ellyn Hack  Increase short of breath  with an inverted wave on EKG  hgb 9.9  Recommendation to send  patient to ER - at [facility] .  Notified cardmaster.       {

## 2020-11-09 NOTE — ED Triage Notes (Signed)
Patient here POV from Home with SOB.  SOB began approximately a few weeks PTA. SOB was acute with Onset and is present at Rest.  BIB Wheelchair. NAD Noted during Triage. A&Ox4. GCS 15.

## 2020-11-09 NOTE — ED Notes (Signed)
Pt became very SOB with transfer from [facility] to stretcher.  Room air sat noted to be 87.  Placed on O2/2L.

## 2020-11-09 NOTE — ED Provider Notes (Signed)
[facility] EMERGENCY DEPT Provider Note   CSN: [id number] Arrival date & time: 11/09/20  1341     History Chief Complaint  Patient presents with   Shortness of Breath    Sandra Beasley is a 70 y.o. female.  Presenting with shortness of breath at least for several days.  Has gotten worse lately.  Patient with an oxygen requirement upon arrival.  Oxygen saturations were 87%.  Initially when she came in there were 9491 but then out in triage dropped down to 87 she was brought back immediately.  Put on 2 L.  But even on the 2 L oxygen sats were like 88%.  So put on 3 L.  Patient denies any chest pain.  Does admit to some leg swelling.  Past medical history significant for coronary artery disease with stents hyperlipidemia hypertension obesity type 2 diabetes.      Past Medical History:  Diagnosis Date   Adenomatous polyp of colon 09/2005   CAD S/P percutaneous coronary angioplasty - DES x2 in RCA 01/02/2013; 02/2013   a. 100% RPDA - Xience Xpedition DES 2.25 mm x 15 mm + 2.25 mm x 8 mm overlapping, mod- severe distal LAD and prox OM1 lesions.;b.  Myoview 02/2013 & Jan 2020: No ischemia or Infarction, Normal EF.   GERD (gastroesophageal reflux disease) 03/06/2013   Hyperlipidemia associated with type 2 diabetes mellitus ([place])    Hypertension    Obesity    STEMI (ST elevation myocardial infarction) ([place]) 01/02/13   --> PCI; EF 60 and 65%. Gr 1 DD & No regional WMA, Otherwise relatively normal.   Type 2 diabetes mellitus with circulatory disorder (HCC)    Vitamin D deficiency     Patient Active Problem List   Diagnosis Date Noted   Acute pulmonary embolism ([place]) 11/09/2020   Exercise-induced leg fatigue 04/15/2020   Ankle swelling 08/09/2014   Swelling of both lower extremities 08/09/2014   GERD (gastroesophageal reflux disease) 03/06/2013   Hyperlipidemia associated with type 2 diabetes mellitus ([place]) 02/24/2013   Severe obesity (BMI >= 40) ([place]) 02/19/2013   ST  segment elevation myocardial infarction (STEMI) of inferolateral wall, subsequent episode of care ([place]) 01/02/2013   CAD S/P percutaneous coronary angioplasty - DES x2 in RCA 01/02/2013   Essential hypertension    Obesity    Type 2 diabetes mellitus with other circulatory complications ([place])    10 cm Lapband July 2006 12/30/2011    Past Surgical History:  Procedure Laterality Date   ABDOMINAL HYSTERECTOMY     CORONARY ANGIOPLASTY WITH STENT PLACEMENT  01/02/2013    PCI-RCA: Xience Xpedition 2.25 mm x 15 mm, 2.25 mm x 8 mm (2.4 mm)   LAPAROSCOPIC GASTRIC BANDING  2006   LAPAROSCOPIC HYSTERECTOMY  1996   LEFT HEART CATHETERIZATION WITH CORONARY ANGIOGRAM N/A 01/02/2013   Procedure: LEFT HEART CATHETERIZATION WITH CORONARY ANGIOGRAM;  Surgeon: Leonie Man, MD;  Location: [facility] CATH LAB;: Inferior STEMI: 100% PDA; mid-distal LAD beyond D1 (small caliber) 80%.  Ostial D1 40%.  Lateral OM 1 with 70-80% stenosis.  Large branching ramus intermedius with OM and DIAG branch branches.   NM MYOVIEW LTD  01/'15; 1/'20   a) EF 60-65%.  Normal wall motion.  No ischemia or infarction.; b) EF 70 to 75%.  No EKG changes.  No evidence of ischemia or infarction.:   TONSILLECTOMY  1966   TRANSTHORACIC ECHOCARDIOGRAM  01/04/2013   (Post inferior STEMI) EF 60 and 65%. Grade 1 diastolic dysfunction with no  regional wall motion abnormalities. Otherwise relatively normal.     OB History   No obstetric history on file.     Family History  Adopted: Yes  Problem Relation Age of Onset   Breast cancer Maternal Aunt    Diabetes Maternal Aunt    Heart disease Maternal Aunt    Diabetes Maternal Uncle    Diabetes gravidarum Maternal Uncle    Colon cancer Neg Hx    Stomach cancer Neg Hx    Rectal cancer Neg Hx     Social History   Tobacco Use   Smoking status: Never   Smokeless tobacco: Never  Substance Use Topics   Alcohol use: No   Drug use: No    Home Medications Prior to Admission medications    Medication Sig Start Date End Date Taking? Authorizing Provider  aspirin EC 81 MG tablet Take 1 tablet (81 mg total) by mouth daily. 12/14/15   Leonie Man, MD  candesartan (ATACAND) 16 MG tablet Take 16 mg by mouth daily. 04/07/19   [provider]  carvedilol (COREG) 6.25 MG tablet Take 1 tablet (6.25 mg total) by mouth 2 (two) times daily with a meal. 05/23/16   Leonie Man, MD  Cholecalciferol 1000 UNITS TBDP Take 1,000 Units by mouth every morning.     [provider]  glimepiride (AMARYL) 4 MG tablet Take 4 mg by mouth daily before breakfast.    [provider]  metFORMIN (GLUCOPHAGE) 500 MG tablet Take 500 mg by mouth 2 (two) times daily with a meal.  10/01/10   [provider]  metroNIDAZOLE (METROCREAM) 0.75 % cream Apply topically 2 (two) times daily. Apply to face    [provider]  nitroGLYCERIN (NITROSTAT) 0.4 MG SL tablet Place 1 tablet (0.4 mg total) under the tongue every 5 (five) minutes as needed for chest pain. 02/19/18   Lendon Colonel, NP  pantoprazole (PROTONIX) 40 MG tablet TAKE 1 TABLET (40 MG TOTAL) BY MOUTH DAILY. 12/31/18   Ladene Artist, MD  rosuvastatin (CRESTOR) 10 MG tablet Take 10 mg by mouth daily. 01/23/17   [provider]  triamcinolone cream (KENALOG) 0.1 % Apply 1 application topically as directed. 12/03/15   [provider]  valACYclovir (VALTREX) 1000 MG tablet TAKE 2 TABLETS TWICE A DAY (2 DOSES PER EPISODE) ORALLY 5 DAYS 01/23/17   [provider]    Allergies    Ace inhibitors and Statins  Review of Systems   Review of Systems  Constitutional:  Negative for chills and fever.  HENT:  Negative for ear pain and sore throat.   Eyes:  Negative for pain and visual disturbance.  Respiratory:  Positive for shortness of breath. Negative for cough.   Cardiovascular:  Positive for leg swelling. Negative for chest pain and palpitations.  Gastrointestinal:  Negative for  abdominal pain and vomiting.  Genitourinary:  Negative for dysuria and hematuria.  Musculoskeletal:  Negative for arthralgias and back pain.  Skin:  Negative for color change and rash.  Neurological:  Negative for seizures and syncope.  All other systems reviewed and are negative.  Physical Exam Updated Vital Signs BP (!) 194/125   Pulse 87   Temp 97.8 F (36.6 C) (Oral)   Resp (!) 22   Ht 1.575 m (5\' 2" )   Wt 104.3 kg   SpO2 95%   BMI 42.07 kg/m   Physical Exam Vitals and nursing note reviewed.  Constitutional:      General:  She is not in acute distress.    Appearance: Normal appearance. She is well-developed. She is obese.  HENT:     Head: Normocephalic and atraumatic.  Eyes:     Extraocular Movements: Extraocular movements intact.     Conjunctiva/sclera: Conjunctivae normal.     Pupils: Pupils are equal, round, and reactive to light.  Cardiovascular:     Rate and Rhythm: Normal rate and regular rhythm.     Heart sounds: No murmur heard. Pulmonary:     Effort: Pulmonary effort is normal. No respiratory distress.     Breath sounds: Normal breath sounds. No wheezing or rales.  Chest:     Chest wall: No tenderness.  Abdominal:     Palpations: Abdomen is soft.     Tenderness: There is no abdominal tenderness.  Musculoskeletal:     Cervical back: Normal range of motion and neck supple.     Right lower leg: Edema present.     Left lower leg: Edema present.  Skin:    General: Skin is warm and dry.     Capillary Refill: Capillary refill takes less than 2 seconds.  Neurological:     General: No focal deficit present.     Mental Status: She is alert and oriented to person, place, and time.     Cranial Nerves: No cranial nerve deficit.     Sensory: No sensory deficit.     Motor: No weakness.    ED Results / Procedures / Treatments   Labs (all labs ordered are listed, but only abnormal results are displayed) Labs Reviewed  BASIC METABOLIC PANEL - Abnormal; Notable  for the following components:      Result Value   CO2 20 (*)    Glucose, Bld 240 (*)    BUN 25 (*)    Creatinine, Ser 1.45 (*)    Calcium 8.8 (*)    GFR, Estimated 38 (*)    All other components within normal limits  CBC WITH DIFFERENTIAL/PLATELET - Abnormal; Notable for the following components:   Hemoglobin 10.3 (*)    HCT 34.9 (*)    MCV 74.3 (*)    MCH 21.9 (*)    MCHC 29.5 (*)    RDW 17.2 (*)    All other components within normal limits  BRAIN NATRIURETIC PEPTIDE - Abnormal; Notable for the following components:   B Natriuretic Peptide 1,080.1 (*)    All other components within normal limits  D-DIMER, QUANTITATIVE - Abnormal; Notable for the following components:   D-Dimer, Quant 2.48 (*)    All other components within normal limits  TROPONIN I (HIGH SENSITIVITY) - Abnormal; Notable for the following components:   Troponin I (High Sensitivity) 75 (*)    All other components within normal limits  TROPONIN I (HIGH SENSITIVITY) - Abnormal; Notable for the following components:   Troponin I (High Sensitivity) 81 (*)    All other components within normal limits  RESP PANEL BY RT-PCR (FLU A&B, COVID) ARPGX2  HEPARIN LEVEL (UNFRACTIONATED)  PROTIME-INR    EKG EKG Interpretation  Date/Time:  Monday November 09 2020 13:51:22 EDT Ventricular Rate:  80 PR Interval:  176 QRS Duration: 88 QT Interval:  452 QTC Calculation: 521 R Axis:   86 Text Interpretation: Normal sinus rhythm Inferior infarct , age undetermined Anterior infarct , age undetermined ST & T wave abnormality, consider lateral ischemia Prolonged QT Abnormal ECG Confirmed by Fredia Sorrow [phone]) on 11/09/2020 4:53:12 PM  Radiology DG Chest 2 View  Result  Date: 11/09/2020 CLINICAL DATA:  Shortness of breath EXAM: CHEST - 2 VIEW COMPARISON:  02/04/2018 FINDINGS: Chronic cardiomegaly. Mild tortuosity of the aorta. There is pulmonary venous hypertension without frank edema. No infiltrate, collapse or effusion.  Ordinary mild degenerative changes affect the spine. IMPRESSION: Cardiomegaly.  Pulmonary venous hypertension without frank edema. Electronically Signed   By: Nelson Chimes M.D.   On: 11/09/2020 14:37   CT Angio Chest PE W/Cm &/Or Wo Cm  Result Date: 11/09/2020 CLINICAL DATA:  Shortness of breath. EXAM: CT ANGIOGRAPHY CHEST WITH CONTRAST TECHNIQUE: Multidetector CT imaging of the chest was performed using the standard protocol during bolus administration of intravenous contrast. Multiplanar CT image reconstructions and MIPs were obtained to evaluate the vascular anatomy. CONTRAST:  48mL OMNIPAQUE IOHEXOL 350 MG/ML SOLN COMPARISON:  None. FINDINGS: Cardiovascular: An extensive amount of intraluminal filling defects are seen within the bilateral pulmonary arteries and numerous bilateral upper lobe, right middle lobe and bilateral lower lobe branches. No saddle embolus is identified. Mild cardiomegaly is seen with associated right heart strain. No pericardial effusion. Mediastinum/Nodes: No enlarged mediastinal, hilar, or axillary lymph nodes. Thyroid gland, trachea, and esophagus demonstrate no significant findings. Lungs/Pleura: Lungs are clear. No pleural effusion or pneumothorax. Upper Abdomen: There is a moderate-sized hiatal hernia. Evidence of prior gastric banding surgery is seen. Numerous subcentimeter gallstones are seen within the lumen of an otherwise normal-appearing gallbladder. Musculoskeletal: No chest wall abnormality. No acute or significant osseous findings. Review of the MIP images confirms the above findings. IMPRESSION: 1. Extensive bilateral pulmonary emboli with associated right heart strain. 2. Moderate-sized hiatal hernia. 3. Evidence of prior gastric banding surgery. 4. Cholelithiasis. Electronically Signed   By: Virgina Norfolk M.D.   On: 11/09/2020 21:17    Procedures Procedures   Medications Ordered in ED Medications  heparin ADULT infusion 100 units/mL (25000 units/216mL)  (1,350 Units/hr Intravenous New Bag/Given 11/09/20 2144)  furosemide (LASIX) injection 40 mg (40 mg Intravenous Given 11/09/20 1904)  iohexol (OMNIPAQUE) 350 MG/ML injection 50 mL (45 mLs Intravenous Contrast Given 11/09/20 2110)  heparin bolus via infusion 5,000 Units (5,000 Units Intravenous Bolus from Bag 11/09/20 2145)    ED Course  I have reviewed the triage vital signs and the nursing notes.  Pertinent labs & imaging results that were available during my care of the patient were reviewed by me and considered in my medical decision making (see chart for details).    MDM Rules/Calculators/A&P                         CRITICAL CARE Performed by: Fredia Sorrow Total critical care time: 60 minutes Critical care time was exclusive of separately billable procedures and treating other patients. Critical care was necessary to treat or prevent imminent or life-threatening deterioration. Critical care was time spent personally by me on the following activities: development of treatment plan with patient and/or surrogate as well as nursing, discussions with consultants, evaluation of patient's response to treatment, examination of patient, obtaining history from patient or surrogate, ordering and performing treatments and interventions, ordering and review of laboratory studies, ordering and review of radiographic studies, pulse oximetry and re-evaluation of patient's condition.   Patient presenting with a complaint of shortness of breath patient has difficulty pinpoint in how long it has been present.  But she had an oxygen requirement when she got here.  Patient placed on 2 L.  But when I saw her she was still satting around 88% on that  so bumped up to 3 L.  Sats are better.  Patient with some leg swelling.  No chest pain.  Work-up showed some renal insufficiency.  D-dimer was elevated at 2.48.  Troponin stable at 75 and 81.  EKG without acute changes.  COVID and flu testing negative.  CBC no  leukocytosis hemoglobin good at 10.3.  CT angio was ordered for the elevated D-dimer.  They had with [facility].  But it shows no significant bilateral pulmonary emboli not a saddle embolus.  With right heart strain.  Discussed with Dr. Collene Mares from critical care.  He said patient stable due to blood pressures being stable just having the oxygen requirement and patient not tachycardic for admission by hospitalist.  Spoke with Dr. Raliegh Ip he will admit.  Patient started on heparin.   Final Clinical Impression(s) / ED Diagnoses Final diagnoses:  Acute pulmonary embolism with acute cor pulmonale, unspecified pulmonary embolism type [facility])    Rx / DC Orders ED Discharge Orders     None        Fredia Sorrow, MD 11/09/20 2203

## 2020-11-09 NOTE — Telephone Encounter (Signed)
Received call from patient complaining of shortness of breath.   Patient states about a month ago she was diagnosed with anemia by her pcp and was started on iron.  After starting iron, her shortness of breath and fatigue increased.     She continued to take it but her symptoms increased.  She called her pcp and was told to stop it due to concern of side effect of the iron.   Stopped iron 1 week ago, symptoms briefly improved but then increased again.   She states she becomes extremely SOB with minimal exertion, has fatigue as well.  No CP, palpitations, etc.    Last labs drawn were about month ago.  Denies dark stool or blood in urine/stool.   She states she is also having issues with BP per her PCP, he wanted to start her on additional BP meds, unable to check bp at home, no readings.   Some SOB noted while speaking to patient, able to speak in complete sentences.    Patient has appt with PCP today at 10:15.  Patient request appointment with cardiology as daughter was concerned it was cardiac related.   Appt made tomorrow with Fabian Sharp PA.   Recommended ER evaluation as well due to acute/worsening symptoms.   Patient verbalized understanding.

## 2020-11-09 NOTE — Telephone Encounter (Signed)
Please forward to PCP: Dr. Leonides Schanz   Patient went to the ER and actually was evaluated with a PE protocol CT scan that showed bilateral pulmonary emboli.  She will be transferred to [facility] treated for PE.  This is somewhat concerning however because of the anemia.  Hopefully they will solve this problem while in the hospital.  Would not need to be on aspirin and or Plavix if she is on an oral anticoagulant.  Glenetta Hew, MD

## 2020-11-09 NOTE — Progress Notes (Signed)
ANTICOAGULATION CONSULT NOTE - Initial Consult  Pharmacy Consult for Heparin Indication: pulmonary embolus  Allergies  Allergen Reactions   Ace Inhibitors Cough   Statins     Can take Crestor    Patient Measurements: Height: 5\' 2"  (157.5 cm) Weight: 104.3 kg (230 lb) IBW/kg (Calculated) : 50.1 Heparin Dosing Weight: 75.1 kg  Vital Signs: Temp: 97.8 F (36.6 C) (10/03 1352) Temp Source: Oral (10/03 1352) BP: 191/112 (10/03 2015) Pulse Rate: 77 (10/03 2030)  Labs: Recent Labs    11/09/20 1642 11/09/20 1735 11/09/20 1940  HGB 10.3*  --   --   HCT 34.9*  --   --   PLT 218  --   --   CREATININE 1.45*  --   --   TROPONINIHS  --  75* 81*    Estimated Creatinine Clearance: 39.8 mL/min (A) (by C-G formula based on SCr of 1.45 mg/dL (H)).   Medical History: Past Medical History:  Diagnosis Date   Adenomatous polyp of colon 09/2005   CAD S/P percutaneous coronary angioplasty - DES x2 in RCA 01/02/2013; 02/2013   a. 100% RPDA - Xience Xpedition DES 2.25 mm x 15 mm + 2.25 mm x 8 mm overlapping, mod- severe distal LAD and prox OM1 lesions.;b.  Myoview 02/2013 & Jan 2020: No ischemia or Infarction, Normal EF.   GERD (gastroesophageal reflux disease) 03/06/2013   Hyperlipidemia associated with type 2 diabetes mellitus ([place])    Hypertension    Obesity    STEMI (ST elevation myocardial infarction) ([place]) 01/02/13   --> PCI; EF 60 and 65%. Gr 1 DD & No regional WMA, Otherwise relatively normal.   Type 2 diabetes mellitus with circulatory disorder (HCC)    Vitamin D deficiency     Medications:  (Not in a hospital admission)  Scheduled:   heparin  5,000 Units Intravenous Once   Infusions:   heparin     PRN:   Assessment: 31 yof presenting with SOB. Heparin per pharmacy consult placed for pulmonary embolus.  Patient is on not on anticoagulation prior to arrival.  Hgb 10.3; plt 218  Goal of Therapy:  Heparin level 0.3-0.7 units/ml Monitor platelets by anticoagulation  protocol: Yes   Plan:  Give 5000 units bolus x 1 Start heparin infusion at 1350 units/hr Check anti-Xa level in 6-8 hours and daily while on heparin Continue to monitor H&H and platelets  Lorelei Pont, PharmD, BCPS 11/09/2020 9:37 PM ED Clinical Pharmacist -  [phone]

## 2020-11-10 ENCOUNTER — Inpatient Hospital Stay (HOSPITAL_COMMUNITY): Payer: Medicare HMO

## 2020-11-10 ENCOUNTER — Other Ambulatory Visit (HOSPITAL_COMMUNITY): Payer: Self-pay

## 2020-11-10 ENCOUNTER — Ambulatory Visit: Payer: Medicare HMO | Admitting: Physician Assistant

## 2020-11-10 DIAGNOSIS — I82443 Acute embolism and thrombosis of tibial vein, bilateral: Secondary | ICD-10-CM | POA: Diagnosis present

## 2020-11-10 DIAGNOSIS — Z79899 Other long term (current) drug therapy: Secondary | ICD-10-CM | POA: Diagnosis not present

## 2020-11-10 DIAGNOSIS — N179 Acute kidney failure, unspecified: Secondary | ICD-10-CM | POA: Diagnosis not present

## 2020-11-10 DIAGNOSIS — I2609 Other pulmonary embolism with acute cor pulmonale: Principal | ICD-10-CM

## 2020-11-10 DIAGNOSIS — G4700 Insomnia, unspecified: Secondary | ICD-10-CM | POA: Diagnosis present

## 2020-11-10 DIAGNOSIS — I2699 Other pulmonary embolism without acute cor pulmonale: Secondary | ICD-10-CM | POA: Diagnosis not present

## 2020-11-10 DIAGNOSIS — I129 Hypertensive chronic kidney disease with stage 1 through stage 4 chronic kidney disease, or unspecified chronic kidney disease: Secondary | ICD-10-CM | POA: Diagnosis not present

## 2020-11-10 DIAGNOSIS — Z888 Allergy status to other drugs, medicaments and biological substances status: Secondary | ICD-10-CM | POA: Diagnosis not present

## 2020-11-10 DIAGNOSIS — I2782 Chronic pulmonary embolism: Secondary | ICD-10-CM | POA: Diagnosis not present

## 2020-11-10 DIAGNOSIS — E559 Vitamin D deficiency, unspecified: Secondary | ICD-10-CM | POA: Diagnosis present

## 2020-11-10 DIAGNOSIS — K219 Gastro-esophageal reflux disease without esophagitis: Secondary | ICD-10-CM | POA: Diagnosis present

## 2020-11-10 DIAGNOSIS — I252 Old myocardial infarction: Secondary | ICD-10-CM | POA: Diagnosis not present

## 2020-11-10 DIAGNOSIS — Z803 Family history of malignant neoplasm of breast: Secondary | ICD-10-CM | POA: Diagnosis not present

## 2020-11-10 DIAGNOSIS — I161 Hypertensive emergency: Secondary | ICD-10-CM | POA: Diagnosis not present

## 2020-11-10 DIAGNOSIS — E669 Obesity, unspecified: Secondary | ICD-10-CM | POA: Diagnosis present

## 2020-11-10 DIAGNOSIS — Z6841 Body Mass Index (BMI) 40.0 and over, adult: Secondary | ICD-10-CM | POA: Diagnosis not present

## 2020-11-10 DIAGNOSIS — Z20822 Contact with and (suspected) exposure to covid-19: Secondary | ICD-10-CM | POA: Diagnosis not present

## 2020-11-10 DIAGNOSIS — N281 Cyst of kidney, acquired: Secondary | ICD-10-CM | POA: Diagnosis not present

## 2020-11-10 DIAGNOSIS — I82452 Acute embolism and thrombosis of left peroneal vein: Secondary | ICD-10-CM | POA: Diagnosis present

## 2020-11-10 DIAGNOSIS — J9601 Acute respiratory failure with hypoxia: Secondary | ICD-10-CM

## 2020-11-10 DIAGNOSIS — Z7982 Long term (current) use of aspirin: Secondary | ICD-10-CM | POA: Diagnosis not present

## 2020-11-10 DIAGNOSIS — E1165 Type 2 diabetes mellitus with hyperglycemia: Secondary | ICD-10-CM | POA: Diagnosis not present

## 2020-11-10 DIAGNOSIS — Z7984 Long term (current) use of oral hypoglycemic drugs: Secondary | ICD-10-CM | POA: Diagnosis not present

## 2020-11-10 DIAGNOSIS — I2510 Atherosclerotic heart disease of native coronary artery without angina pectoris: Secondary | ICD-10-CM | POA: Diagnosis present

## 2020-11-10 DIAGNOSIS — E785 Hyperlipidemia, unspecified: Secondary | ICD-10-CM | POA: Diagnosis present

## 2020-11-10 DIAGNOSIS — N1832 Chronic kidney disease, stage 3b: Secondary | ICD-10-CM | POA: Diagnosis present

## 2020-11-10 DIAGNOSIS — E1122 Type 2 diabetes mellitus with diabetic chronic kidney disease: Secondary | ICD-10-CM | POA: Diagnosis not present

## 2020-11-10 DIAGNOSIS — R0602 Shortness of breath: Secondary | ICD-10-CM | POA: Diagnosis not present

## 2020-11-10 DIAGNOSIS — E1159 Type 2 diabetes mellitus with other circulatory complications: Secondary | ICD-10-CM | POA: Diagnosis not present

## 2020-11-10 HISTORY — PX: TRANSTHORACIC ECHOCARDIOGRAM: SHX275

## 2020-11-10 LAB — ECHOCARDIOGRAM COMPLETE
AR max vel: 1.59 cm2
AV Area VTI: 1.71 cm2
AV Area mean vel: 1.94 cm2
AV Mean grad: 3 mmHg
AV Peak grad: 7.2 mmHg
Ao pk vel: 1.34 m/s
Area-P 1/2: 3.66 cm2
Height: 62 in
S' Lateral: 3.4 cm
Weight: 3636.71 oz

## 2020-11-10 LAB — MRSA NEXT GEN BY PCR, NASAL: MRSA by PCR Next Gen: NOT DETECTED

## 2020-11-10 LAB — BASIC METABOLIC PANEL
Anion gap: 13 (ref 5–15)
BUN: 20 mg/dL (ref 8–23)
CO2: 22 mmol/L (ref 22–32)
Calcium: 8.9 mg/dL (ref 8.9–10.3)
Chloride: 103 mmol/L (ref 98–111)
Creatinine, Ser: 1.37 mg/dL — ABNORMAL HIGH (ref 0.44–1.00)
GFR, Estimated: 41 mL/min — ABNORMAL LOW (ref >60)
Glucose, Bld: 221 mg/dL — ABNORMAL HIGH (ref 70–99)
Potassium: 3 mmol/L — ABNORMAL LOW (ref 3.5–5.1)
Sodium: 138 mmol/L (ref 135–145)

## 2020-11-10 LAB — CBC
HCT: 34 % — ABNORMAL LOW (ref 36.0–46.0)
Hemoglobin: 10.2 g/dL — ABNORMAL LOW (ref 12.0–15.0)
MCH: 22.2 pg — ABNORMAL LOW (ref 26.0–34.0)
MCHC: 30 g/dL (ref 30.0–36.0)
MCV: 74.1 fL — ABNORMAL LOW (ref 80.0–100.0)
Platelets: 210 10*3/uL (ref 150–400)
RBC: 4.59 MIL/uL (ref 3.87–5.11)
RDW: 17.2 % — ABNORMAL HIGH (ref 11.5–15.5)
WBC: 9 10*3/uL (ref 4.0–10.5)
nRBC: 0 % (ref 0.0–0.2)

## 2020-11-10 LAB — GLUCOSE, CAPILLARY
Glucose-Capillary: 159 mg/dL — ABNORMAL HIGH (ref 70–99)
Glucose-Capillary: 163 mg/dL — ABNORMAL HIGH (ref 70–99)
Glucose-Capillary: 187 mg/dL — ABNORMAL HIGH (ref 70–99)
Glucose-Capillary: 188 mg/dL — ABNORMAL HIGH (ref 70–99)
Glucose-Capillary: 207 mg/dL — ABNORMAL HIGH (ref 70–99)
Glucose-Capillary: 230 mg/dL — ABNORMAL HIGH (ref 70–99)

## 2020-11-10 LAB — HEMOGLOBIN A1C
Hgb A1c MFr Bld: 7 % — ABNORMAL HIGH (ref 4.8–5.6)
Mean Plasma Glucose: 154.2 mg/dL

## 2020-11-10 LAB — HEPARIN LEVEL (UNFRACTIONATED)
Heparin Unfractionated: 0.61 IU/mL (ref 0.30–0.70)
Heparin Unfractionated: 0.57 IU/mL (ref 0.30–0.70)

## 2020-11-10 LAB — LACTIC ACID, PLASMA: Lactic Acid, Venous: 1.3 mmol/L (ref 0.5–1.9)

## 2020-11-10 LAB — TROPONIN I (HIGH SENSITIVITY): Troponin I (High Sensitivity): 72 ng/L — ABNORMAL HIGH

## 2020-11-10 MED ORDER — HEPARIN (PORCINE) 25000 UT/250ML-% IV SOLN
1300.0000 [IU]/h | INTRAVENOUS | Status: AC
Start: 2020-11-10 — End: 2020-11-12
  Administered 2020-11-10 – 2020-11-11 (×2): 1350 [IU]/h via INTRAVENOUS
  Administered 2020-11-12: 1300 [IU]/h via INTRAVENOUS
  Filled 2020-11-10 (×2): qty 250

## 2020-11-10 MED ORDER — POTASSIUM CHLORIDE CRYS ER 20 MEQ PO TBCR
40.0000 meq | EXTENDED_RELEASE_TABLET | Freq: Two times a day (BID) | ORAL | Status: AC
  Administered 2020-11-10 (×2): 40 meq via ORAL
  Filled 2020-11-10 (×2): qty 2

## 2020-11-10 MED ORDER — ROSUVASTATIN CALCIUM 5 MG PO TABS
10.0000 mg | ORAL_TABLET | Freq: Every day | ORAL | Status: DC
  Administered 2020-11-10 – 2020-11-13 (×4): 10 mg via ORAL
  Filled 2020-11-10 (×4): qty 2

## 2020-11-10 MED ORDER — ORAL CARE MOUTH RINSE: 15.0000 mL | Freq: Two times a day (BID) | OROMUCOSAL | Status: DC

## 2020-11-10 MED ORDER — CARVEDILOL 12.5 MG PO TABS: 6.2500 mg | ORAL_TABLET | Freq: Two times a day (BID) | ORAL | Status: DC

## 2020-11-10 MED ORDER — INSULIN ASPART 100 UNIT/ML IJ SOLN
0.0000 [IU] | INTRAMUSCULAR | Status: DC
  Administered 2020-11-10: 5 [IU] via SUBCUTANEOUS

## 2020-11-10 MED ORDER — MELATONIN 3 MG PO TABS
3.0000 mg | ORAL_TABLET | Freq: Every evening | ORAL | Status: AC | PRN
  Administered 2020-11-10 – 2020-11-11 (×2): 3 mg via ORAL
  Filled 2020-11-10 (×3): qty 1

## 2020-11-10 MED ORDER — CHLORHEXIDINE GLUCONATE CLOTH 2 % EX PADS
6.0000 | MEDICATED_PAD | Freq: Every day | CUTANEOUS | Status: DC
  Administered 2020-11-11: 6 via TOPICAL

## 2020-11-10 MED ORDER — CHLORHEXIDINE GLUCONATE CLOTH 2 % EX PADS: 6.0000 | MEDICATED_PAD | Freq: Every day | CUTANEOUS | Status: DC

## 2020-11-10 MED ORDER — ROSUVASTATIN CALCIUM 5 MG PO TABS: 10.0000 mg | ORAL_TABLET | Freq: Every day | ORAL | Status: DC

## 2020-11-10 MED ORDER — POTASSIUM CHLORIDE CRYS ER 20 MEQ PO TBCR: 40.0000 meq | EXTENDED_RELEASE_TABLET | Freq: Two times a day (BID) | ORAL | Status: DC

## 2020-11-10 MED ORDER — CARVEDILOL 6.25 MG PO TABS
6.2500 mg | ORAL_TABLET | Freq: Two times a day (BID) | ORAL | Status: DC
  Administered 2020-11-10 – 2020-11-13 (×7): 6.25 mg via ORAL
  Filled 2020-11-10 (×2): qty 1
  Filled 2020-11-10 (×2): qty 0.5
  Filled 2020-11-10 (×3): qty 1
  Filled 2020-11-10: qty 0.5

## 2020-11-10 MED ORDER — METOPROLOL TARTRATE 5 MG/5ML IV SOLN
5.0000 mg | Freq: Four times a day (QID) | INTRAVENOUS | Status: DC | PRN
  Administered 2020-11-10 (×2): 5 mg via INTRAVENOUS
  Filled 2020-11-10 (×3): qty 5

## 2020-11-10 MED ORDER — INSULIN ASPART 100 UNIT/ML IJ SOLN
0.0000 [IU] | INTRAMUSCULAR | Status: DC
  Administered 2020-11-10: 3 [IU] via SUBCUTANEOUS
  Administered 2020-11-10: 5 [IU] via SUBCUTANEOUS
  Administered 2020-11-10 – 2020-11-11 (×6): 3 [IU] via SUBCUTANEOUS
  Administered 2020-11-11 (×2): 5 [IU] via SUBCUTANEOUS
  Administered 2020-11-12 (×3): 3 [IU] via SUBCUTANEOUS
  Administered 2020-11-12 (×2): 2 [IU] via SUBCUTANEOUS
  Administered 2020-11-12 – 2020-11-13 (×2): 3 [IU] via SUBCUTANEOUS
  Administered 2020-11-13: 2 [IU] via SUBCUTANEOUS

## 2020-11-10 MED ORDER — ACETAMINOPHEN 325 MG PO TABS
650.0000 mg | ORAL_TABLET | Freq: Four times a day (QID) | ORAL | Status: DC | PRN
  Filled 2020-11-10: qty 2

## 2020-11-10 MED ORDER — IRBESARTAN 300 MG PO TABS
300.0000 mg | ORAL_TABLET | Freq: Every day | ORAL | Status: DC
  Filled 2020-11-10: qty 1

## 2020-11-10 MED ORDER — HYDRALAZINE HCL 50 MG PO TABS
50.0000 mg | ORAL_TABLET | Freq: Four times a day (QID) | ORAL | Status: DC | PRN
  Administered 2020-11-10 (×2): 50 mg via ORAL
  Filled 2020-11-10 (×3): qty 1

## 2020-11-10 MED ORDER — POTASSIUM CHLORIDE CRYS ER 20 MEQ PO TBCR: 40.0000 meq | EXTENDED_RELEASE_TABLET | Freq: Once | ORAL | Status: DC

## 2020-11-10 MED ORDER — IRBESARTAN 300 MG PO TABS
300.0000 mg | ORAL_TABLET | Freq: Every day | ORAL | Status: DC
  Administered 2020-11-10 – 2020-11-13 (×3): 300 mg via ORAL
  Filled 2020-11-10 (×4): qty 1

## 2020-11-10 MED ORDER — ORAL CARE MOUTH RINSE
15.0000 mL | Freq: Two times a day (BID) | OROMUCOSAL | Status: DC
  Administered 2020-11-10 – 2020-11-13 (×7): 15 mL via OROMUCOSAL

## 2020-11-10 NOTE — Progress Notes (Signed)
NAME:  Sandra Beasley, MRN:  [id number], DOB:  [birth year], LOS: 0 ADMISSION DATE:  11/09/2020, CONSULTATION DATE:  11/10/20 REFERRING MD:  EDP, CHIEF COMPLAINT:  Shortness of breath   History of Present Illness:  Sandra Beasley is a 70 y.o. F with PMH of HTN, Type 2 DM, CAD, HL, prior STEMI who started developing exertional shortness of breath approximately two weeks ago.    She thought she had anemia and saw her PCP who started iron, however her symptoms worsened so she presented to the [facility] ED.  She notes a plane trip to [place] one month ago, denies significant leg edema, personal history of malignancy, hormone replacement or personal or family history of clots.   In the ED, she required nasal cannula O2, no hypotension or tachycardia.   CTA chest with extensive bilateral pulmonary emboli.  She was started on heparin and transferred to [facility].   Pertinent  Medical History   has a past medical history of Adenomatous polyp of colon (09/2005), CAD S/P percutaneous coronary angioplasty - DES x2 in RCA (01/02/2013; 02/2013), GERD (gastroesophageal reflux disease) (03/06/2013), Hyperlipidemia associated with type 2 diabetes mellitus ([place]), Hypertension, Obesity, STEMI (ST elevation myocardial infarction) ([place]) (01/02/13), Type 2 diabetes mellitus with circulatory disorder ([place]), and Vitamin D deficiency.   Significant Hospital Events: Including procedures, antibiotic start and stop dates in addition to other pertinent events   10/4 Admit to [facility] ICU, transfer from [place], 5L [place] 10/4 heparin gtt   Interim History / Subjective:   Pt awake and in no distress. Does note some diffuse body aches no sick contacts recently beyond on the airplane about a month ago.   Objective   Blood pressure (!) 184/113, pulse 73, temperature 97.7 F (36.5 C), temperature source Oral, resp. rate (!) 24, height 5\' 2"  (1.575 m), weight 103.1 kg, SpO2 95 %.        Intake/Output Summary (Last 24  hours) at 11/10/2020 1344 Last data filed at 11/10/2020 0600 Gross per 24 hour  Intake 160.82 ml  Output --  Net 160.82 ml   Filed Weights   11/09/20 1349 11/10/20 0320  Weight: 104.3 kg 103.1 kg   General:  well-nourished, awake and in no distress HEENT: MM pink/moist Neuro: awake, oriented x4, no focal deficits  CV: s1s2 rrr, no m/r/g PULM:  Non labored breathing, clear bilaterally, 3L [place] GI: soft, bsx4 active  Extremities: warm/dry, no edema  Skin: no rashes or lesions   Resolved Hospital Problem list     Assessment & Plan:   Acute Bilateral PE Likely provoked in the setting of recent travel, patient also with risk factor of obesity. Evidence of RH strain on CT Troponin 81, BNP 1000 PESI score 92 class III and intermediate high risk given her PESI class, signs of RV dysfunction, and elevated cardiac biomarkers.   P: -Continue  ICU for close monitoring  -continue heparin -monitor for signs of worsening hemodynamic instability, consult IR if becoming clinically unstable -F/u Echo and BLE venous duplexes, patient with BLE venous duplexes from 04/2020 no evidence of reflux or DVT at that time.   Acute Hypoxic Respiratory failure  Secondary to the above. Decreased O2 requirement this AM on rounds. Patient also received IV lasix 40mg  x1 ON.  P: -continue O2 supplementation with nasal cannula as needed   Hypertension, CAD 159/115 on presentation to the hospital. Up to the 735H systolic ON. Patient has required IV metop x1. She did not receive  her home antihypertensives.  P: -Continue home Coreg and irbesartan while inpatient (home ARB not on formulary) -prn PO hydralazine  Type 2 DM -SSI, hold home Metformin and Glimepiride   Best Practice (right click and "Reselect all SmartList Selections" daily)   Diet/type: Regular consistency (see orders) DVT prophylaxis: systemic heparin GI prophylaxis: N/A Lines: N/A Foley:  N/A Code Status:  full code Last date of  multidisciplinary goals of care discussion []   Labs   CBC: Recent Labs  Lab 11/09/20 1642 11/10/20 0516  WBC 7.6 9.0  NEUTROABS 5.5  --   HGB 10.3* 10.2*  HCT 34.9* 34.0*  MCV 74.3* 74.1*  PLT 218 210     Basic Metabolic Panel: Recent Labs  Lab 11/09/20 1642 11/10/20 0516  NA 139 138  K 3.5 3.0*  CL 106 103  CO2 20* 22  GLUCOSE 240* 221*  BUN 25* 20  CREATININE 1.45* 1.37*  CALCIUM 8.8* 8.9    GFR: Estimated Creatinine Clearance: 41.8 mL/min (A) (by C-G formula based on SCr of 1.37 mg/dL (H)). Recent Labs  Lab 11/09/20 1642 11/10/20 0516 11/10/20 1158  WBC 7.6 9.0  --   LATICACIDVEN  --   --  1.3     Liver Function Tests: No results for input(s): AST, ALT, ALKPHOS, BILITOT, PROT, ALBUMIN in the last 168 hours. No results for input(s): LIPASE, AMYLASE in the last 168 hours. No results for input(s): AMMONIA in the last 168 hours.  ABG    Component Value Date/Time   TCO2 23 01/02/2013 1753      Coagulation Profile: Recent Labs  Lab 11/09/20 1858  INR 1.1     Cardiac Enzymes: No results for input(s): CKTOTAL, CKMB, CKMBINDEX, TROPONINI in the last 168 hours.  HbA1C: Hgb A1c MFr Bld  Date/Time Value Ref Range Status  11/10/2020 05:16 AM 7.0 (H) 4.8 - 5.6 % Final    Comment:    (NOTE) Pre diabetes:          5.7%-6.4%  Diabetes:              >6.4%  Glycemic control for   <7.0% adults with diabetes   01/03/2013 02:45 AM 8.2 (H) <5.7 % Final    Comment:    (NOTE)                                                                       According to the ADA Clinical Practice Recommendations for 2011, when HbA1c is used as a screening test:  >=6.5%   Diagnostic of Diabetes Mellitus           (if abnormal result is confirmed) 5.7-6.4%   Increased risk of developing Diabetes Mellitus References:Diagnosis and Classification of Diabetes Mellitus,Diabetes ZHYQ,6578,46(NGEXB 1):S62-S69 and Standards of Medical Care in         Diabetes -  2011,Diabetes Care,2011,34 (Suppl 1):S11-S61.    CBG: Recent Labs  Lab 11/10/20 0320 11/10/20 0739 11/10/20 1128  GLUCAP 207* 188* 159*     Review of Systems:   Negative except as noted in HPI  Past Medical History:  She,  has a past medical history of Adenomatous polyp of colon (09/2005), CAD S/P percutaneous coronary angioplasty - DES x2 in RCA (01/02/2013; 02/2013), GERD (  gastroesophageal reflux disease) (03/06/2013), Hyperlipidemia associated with type 2 diabetes mellitus ([place]), Hypertension, Obesity, STEMI (ST elevation myocardial infarction) ([place]) (01/02/13), Type 2 diabetes mellitus with circulatory disorder ([place]), and Vitamin D deficiency.   Surgical History:   Past Surgical History:  Procedure Laterality Date   ABDOMINAL HYSTERECTOMY     CORONARY ANGIOPLASTY WITH STENT PLACEMENT  01/02/2013    PCI-RCA: Xience Xpedition 2.25 mm x 15 mm, 2.25 mm x 8 mm (2.4 mm)   LAPAROSCOPIC GASTRIC BANDING  2006   LAPAROSCOPIC HYSTERECTOMY  1996   LEFT HEART CATHETERIZATION WITH CORONARY ANGIOGRAM N/A 01/02/2013   Procedure: LEFT HEART CATHETERIZATION WITH CORONARY ANGIOGRAM;  Surgeon: Leonie Man, MD;  Location: [facility] CATH LAB;: Inferior STEMI: 100% PDA; mid-distal LAD beyond D1 (small caliber) 80%.  Ostial D1 40%.  Lateral OM 1 with 70-80% stenosis.  Large branching ramus intermedius with OM and DIAG branch branches.   NM MYOVIEW LTD  01/'15; 1/'20   a) EF 60-65%.  Normal wall motion.  No ischemia or infarction.; b) EF 70 to 75%.  No EKG changes.  No evidence of ischemia or infarction.:   TONSILLECTOMY  1966   TRANSTHORACIC ECHOCARDIOGRAM  01/04/2013   (Post inferior STEMI) EF 60 and 65%. Grade 1 diastolic dysfunction with no regional wall motion abnormalities. Otherwise relatively normal.     Social History:   reports that she has never smoked. She has never used smokeless tobacco. She reports that she does not drink alcohol and does not use drugs.   Family History:  Her family  history includes Breast cancer in her maternal aunt; Diabetes in her maternal aunt and maternal uncle; Diabetes gravidarum in her maternal uncle; Heart disease in her maternal aunt. There is no history of Colon cancer, Stomach cancer, or Rectal cancer. She was adopted.   Allergies Allergies  Allergen Reactions   Ace Inhibitors Cough   Other Cough    Spices make pt cough, sneeze, eyes water   Statins     Can take Crestor     Home Medications  Prior to Admission medications   Medication Sig Start Date End Date Taking? Authorizing Provider  aspirin EC 81 MG tablet Take 1 tablet (81 mg total) by mouth daily. 12/14/15   Leonie Man, MD  candesartan (ATACAND) 16 MG tablet Take 16 mg by mouth daily. 04/07/19   [provider]  carvedilol (COREG) 6.25 MG tablet Take 1 tablet (6.25 mg total) by mouth 2 (two) times daily with a meal. 05/23/16   Leonie Man, MD  Cholecalciferol 1000 UNITS TBDP Take 1,000 Units by mouth every morning.     [provider]  glimepiride (AMARYL) 4 MG tablet Take 4 mg by mouth daily before breakfast.    [provider]  metFORMIN (GLUCOPHAGE) 500 MG tablet Take 500 mg by mouth 2 (two) times daily with a meal.  10/01/10   [provider]  metroNIDAZOLE (METROCREAM) 0.75 % cream Apply topically 2 (two) times daily. Apply to face    [provider]  nitroGLYCERIN (NITROSTAT) 0.4 MG SL tablet Place 1 tablet (0.4 mg total) under the tongue every 5 (five) minutes as needed for chest pain. 02/19/18   Lendon Colonel, NP  pantoprazole (PROTONIX) 40 MG tablet TAKE 1 TABLET (40 MG TOTAL) BY MOUTH DAILY. 12/31/18   Ladene Artist, MD  rosuvastatin (CRESTOR) 10 MG tablet Take 10 mg by mouth daily. 01/23/17   [provider]  triamcinolone cream (KENALOG) 0.1 % Apply  1 application topically as directed. 12/03/15   [provider]  valACYclovir (VALTREX) 1000 MG tablet TAKE 2 TABLETS TWICE A DAY (2 DOSES PER  EPISODE) ORALLY 5 DAYS 01/23/17   [provider]    Rick Duff, MD PGY-2 Internal Medicine  Pager [phone]

## 2020-11-10 NOTE — Progress Notes (Addendum)
ANTICOAGULATION CONSULT NOTE - Initial Consult  Pharmacy Consult for IV Heparin Indication: new pulmonary embolus  Allergies  Allergen Reactions   Ace Inhibitors Cough   Statins     Can take Crestor    Patient Measurements: Height: 5\' 2"  (157.5 cm) Weight: 103.1 kg (227 lb 4.7 oz) IBW/kg (Calculated) : 50.1 Heparin Dosing Weight: 75.1 kg  Vital Signs: Temp: 97.9 F (36.6 C) (10/04 0320) Temp Source: Oral (10/04 0320) BP: 184/113 (10/04 0600) Pulse Rate: 73 (10/04 0600)  Labs: Recent Labs    11/09/20 1642 11/09/20 1735 11/09/20 1858 11/09/20 1940 11/10/20 0516  HGB 10.3*  --   --   --  10.2*  HCT 34.9*  --   --   --  34.0*  PLT 218  --   --   --  210  LABPROT  --   --  14.1  --   --   INR  --   --  1.1  --   --   HEPARINUNFRC  --   --   --   --  0.61  CREATININE 1.45*  --   --   --  1.37*  TROPONINIHS  --  75*  --  81*  --      Estimated Creatinine Clearance: 41.8 mL/min (A) (by C-G formula based on SCr of 1.37 mg/dL (H)).   Medical History: Past Medical History:  Diagnosis Date   Adenomatous polyp of colon 09/2005   CAD S/P percutaneous coronary angioplasty - DES x2 in RCA 01/02/2013; 02/2013   a. 100% RPDA - Xience Xpedition DES 2.25 mm x 15 mm + 2.25 mm x 8 mm overlapping, mod- severe distal LAD and prox OM1 lesions.;b.  Myoview 02/2013 & Jan 2020: No ischemia or Infarction, Normal EF.   GERD (gastroesophageal reflux disease) 03/06/2013   Hyperlipidemia associated with type 2 diabetes mellitus ([place])    Hypertension    Obesity    STEMI (ST elevation myocardial infarction) ([place]) 01/02/13   --> PCI; EF 60 and 65%. Gr 1 DD & No regional WMA, Otherwise relatively normal.   Type 2 diabetes mellitus with circulatory disorder ([facility])    Vitamin D deficiency     Medications:  Medications Prior to Admission  Medication Sig Dispense Refill Last Dose   aspirin EC 81 MG tablet Take 1 tablet (81 mg total) by mouth daily. 90 tablet 3    candesartan (ATACAND) 16 MG  tablet Take 16 mg by mouth daily.      carvedilol (COREG) 6.25 MG tablet Take 1 tablet (6.25 mg total) by mouth 2 (two) times daily with a meal. 180 tablet 2    Cholecalciferol 1000 UNITS TBDP Take 1,000 Units by mouth every morning.       glimepiride (AMARYL) 4 MG tablet Take 4 mg by mouth daily before breakfast.      metFORMIN (GLUCOPHAGE) 500 MG tablet Take 500 mg by mouth 2 (two) times daily with a meal.       metroNIDAZOLE (METROCREAM) 0.75 % cream Apply topically 2 (two) times daily. Apply to face      nitroGLYCERIN (NITROSTAT) 0.4 MG SL tablet Place 1 tablet (0.4 mg total) under the tongue every 5 (five) minutes as needed for chest pain. 25 tablet 1    pantoprazole (PROTONIX) 40 MG tablet TAKE 1 TABLET (40 MG TOTAL) BY MOUTH DAILY. 30 tablet 0    rosuvastatin (CRESTOR) 10 MG tablet Take 10 mg by mouth daily.  4    triamcinolone  cream (KENALOG) 0.1 % Apply 1 application topically as directed.      valACYclovir (VALTREX) 1000 MG tablet TAKE 2 TABLETS TWICE A DAY (2 DOSES PER EPISODE) ORALLY 5 DAYS  1     Scheduled:   carvedilol  6.25 mg Oral BID WC   Chlorhexidine Gluconate Cloth  6 each Topical Daily   insulin aspart  0-15 Units Subcutaneous Q4H   irbesartan  300 mg Oral Daily   mouth rinse  15 mL Mouth Rinse BID   rosuvastatin  10 mg Oral Daily   Infusions:   heparin 1,350 Units/hr (11/10/20 0600)   PRN:   Assessment: 64 yof presenting with SOB x 2 weeks and PMH of HTN, DM, CAD found to have submassive PE. Evidence of R heart strain. Likely provoked by recent travel history 1 month ago. No personal or family history of clots. No PTA anticoagulation. Heparin per pharmacy consult placed for pulmonary embolus.  Hgb 10.2; plt 210 - stable  Heparin level: 0.61, therapeutic  No s/s of bleeding noted, discussed with nurse  Goal of Therapy:  Heparin level 0.3-0.7 units/ml Monitor platelets by anticoagulation protocol: Yes   Plan:  Continue heparin infusion at 1350 units/hr Check  heparin level in 8 hours and daily while on heparin Continue to monitor H&H and platelets  Thank you for allowing pharmacy to participate in this patient's care.  Levonne Spiller, PharmD PGY1 Acute Care Resident  11/10/2020,7:09 AM  ------------------------------------------------------------------------------------------------------------------- Addendum:  Heparin level 0.57, therapeutic Still running appropriate and no issues noted on infusion  Plan:  Continue heparin infusion at 1350 units/hr Check daily heparin levels Monitor CBC and plans for therapy  Thank you for allowing pharmacy to participate in this patient's care.  Levonne Spiller, PharmD PGY1 Acute Care Resident  11/10/2020,1:59 PM

## 2020-11-10 NOTE — H&P (Signed)
NAME:  Sandra Beasley, MRN:  [id number], DOB:  20-Jun-1950, LOS: 0 ADMISSION DATE:  11/09/2020, CONSULTATION DATE:  11/10/20 REFERRING MD:  EDP, CHIEF COMPLAINT:  Shortness of breath   History of Present Illness:  Sandra Beasley is a 70 y.o. F with PMH of HTN, Type 2 DM, CAD, HL, prior STEMI who started developing exertional shortness of breath approximately two weeks ago.    She thought she had anemia and saw her PCP who started iron, however her symptoms worsened so she presented to the [place] ED.  She notes a plane trip to [place] one month ago, denies significant leg edema, hormone replacement or family history of clots.    In the ED, she required nasal cannula O2, no hypotension or tachycardia.   CTA chest with extensive bilateral pulmonary emboli.  She was started on heparin and transferred to [facility].   Pertinent  Medical History   has a past medical history of Adenomatous polyp of colon (09/2005), CAD S/P percutaneous coronary angioplasty - DES x2 in RCA (01/02/2013; 02/2013), GERD (gastroesophageal reflux disease) (03/06/2013), Hyperlipidemia associated with type 2 diabetes mellitus ([place]), Hypertension, Obesity, STEMI (ST elevation myocardial infarction) ([place]) (01/02/13), Type 2 diabetes mellitus with circulatory disorder ([place]), and Vitamin D deficiency.   Significant Hospital Events: Including procedures, antibiotic start and stop dates in addition to other pertinent events   10/4 Admit to [facility] ICU, transfer from [place], 5L [place]  Interim History / Subjective:   Pt awake and in no distress after transfer  Objective   Blood pressure (!) 182/120, pulse 80, temperature 97.9 F (36.6 C), temperature source Oral, resp. rate (!) 26, height 5\' 2"  (1.575 m), weight 103.1 kg, SpO2 92 %.       No intake or output data in the 24 hours ending 11/10/20 0347 Filed Weights   11/09/20 1349 11/10/20 0320  Weight: 104.3 kg 103.1 kg   General:  well-nourished F, awake and in no  distress HEENT: MM pink/moist Neuro: awake, oriented x3, no focal deficits  CV: s1s2 rrr, no m/r/g PULM:  Mildly dyspneic with conversation, clear bilaterally, 5L [place] GI: soft, bsx4 active  Extremities: warm/dry, no edema  Skin: no rashes or lesions   Resolved Hospital Problem list     Assessment & Plan:    Acute Bilateral PE Evidence of RH strain on CT Troponin 81, BNP 1000 P: -Admit to ICU for close monitoring  -continue heparin -monitor for signs of worsening hemodynamic instability, consult IR if becoming clinically unstable -Echo    Acute Hypoxic Respiratory failure  Secondary to the above  P: -continue O2 supplementation with nasal cannula as needed    Hypertension, CAD 182/100 after transfer  P: -resume home Coreg and Atacand -prn hydralazine    Type 2 DM -SSI, hold home Metformin and Glimeparide      Best Practice (right click and "Reselect all SmartList Selections" daily)   Diet/type: Regular consistency (see orders) DVT prophylaxis: systemic heparin GI prophylaxis: N/A Lines: N/A Foley:  N/A Code Status:  full code Last date of multidisciplinary goals of care discussion []   Labs   CBC: Recent Labs  Lab 11/09/20 1642  WBC 7.6  NEUTROABS 5.5  HGB 10.3*  HCT 34.9*  MCV 74.3*  PLT 409    Basic Metabolic Panel: Recent Labs  Lab 11/09/20 1642  NA 139  K 3.5  CL 106  CO2 20*  GLUCOSE 240*  BUN 25*  CREATININE 1.45*  CALCIUM 8.8*  GFR: Estimated Creatinine Clearance: 39.5 mL/min (A) (by C-G formula based on SCr of 1.45 mg/dL (H)). Recent Labs  Lab 11/09/20 1642  WBC 7.6    Liver Function Tests: No results for input(s): AST, ALT, ALKPHOS, BILITOT, PROT, ALBUMIN in the last 168 hours. No results for input(s): LIPASE, AMYLASE in the last 168 hours. No results for input(s): AMMONIA in the last 168 hours.  ABG    Component Value Date/Time   TCO2 23 01/02/2013 1753     Coagulation Profile: Recent Labs  Lab  11/09/20 1858  INR 1.1    Cardiac Enzymes: No results for input(s): CKTOTAL, CKMB, CKMBINDEX, TROPONINI in the last 168 hours.  HbA1C: Hgb A1c MFr Bld  Date/Time Value Ref Range Status  01/03/2013 02:45 AM 8.2 (H) <5.7 % Final    Comment:    (NOTE)                                                                       According to the ADA Clinical Practice Recommendations for 2011, when HbA1c is used as a screening test:  >=6.5%   Diagnostic of Diabetes Mellitus           (if abnormal result is confirmed) 5.7-6.4%   Increased risk of developing Diabetes Mellitus References:Diagnosis and Classification of Diabetes Mellitus,Diabetes Care,2011,34(Suppl 1):S62-S69 and Standards of Medical Care in         Diabetes - 2011,Diabetes IONG,2952,84 (Suppl 1):S11-S61.  01/02/2013 06:00 PM 8.7 (H) <5.7 % Final    Comment:    (NOTE)                                                                       According to the ADA Clinical Practice Recommendations for 2011, when HbA1c is used as a screening test:  >=6.5%   Diagnostic of Diabetes Mellitus           (if abnormal result is confirmed) 5.7-6.4%   Increased risk of developing Diabetes Mellitus References:Diagnosis and Classification of Diabetes Mellitus,Diabetes XLKG,4010,27(OZDGU 1):S62-S69 and Standards of Medical Care in         Diabetes - 2011,Diabetes Care,2011,34 (Suppl 1):S11-S61.    CBG: Recent Labs  Lab 11/10/20 0320  GLUCAP 207*    Review of Systems:   Negative except as noted in HPI  Past Medical History:  She,  has a past medical history of Adenomatous polyp of colon (09/2005), CAD S/P percutaneous coronary angioplasty - DES x2 in RCA (01/02/2013; 02/2013), GERD (gastroesophageal reflux disease) (03/06/2013), Hyperlipidemia associated with type 2 diabetes mellitus ([place]), Hypertension, Obesity, STEMI (ST elevation myocardial infarction) ([place]) (01/02/13), Type 2 diabetes mellitus with circulatory disorder ([place]), and Vitamin  D deficiency.   Surgical History:   Past Surgical History:  Procedure Laterality Date   ABDOMINAL HYSTERECTOMY     CORONARY ANGIOPLASTY WITH STENT PLACEMENT  01/02/2013    PCI-RCA: Xience Xpedition 2.25 mm x 15 mm, 2.25 mm x 8 mm (2.4 mm)   LAPAROSCOPIC GASTRIC BANDING  2006   LAPAROSCOPIC HYSTERECTOMY  1996   LEFT HEART CATHETERIZATION WITH CORONARY ANGIOGRAM N/A 01/02/2013   Procedure: LEFT HEART CATHETERIZATION WITH CORONARY ANGIOGRAM;  Surgeon: Leonie Man, MD;  Location: [facility] CATH LAB;: Inferior STEMI: 100% PDA; mid-distal LAD beyond D1 (small caliber) 80%.  Ostial D1 40%.  Lateral OM 1 with 70-80% stenosis.  Large branching ramus intermedius with OM and DIAG branch branches.   NM MYOVIEW LTD  01/'15; 1/'20   a) EF 60-65%.  Normal wall motion.  No ischemia or infarction.; b) EF 70 to 75%.  No EKG changes.  No evidence of ischemia or infarction.:   TONSILLECTOMY  1966   TRANSTHORACIC ECHOCARDIOGRAM  01/04/2013   (Post inferior STEMI) EF 60 and 65%. Grade 1 diastolic dysfunction with no regional wall motion abnormalities. Otherwise relatively normal.     Social History:   reports that she has never smoked. She has never used smokeless tobacco. She reports that she does not drink alcohol and does not use drugs.   Family History:  Her family history includes Breast cancer in her maternal aunt; Diabetes in her maternal aunt and maternal uncle; Diabetes gravidarum in her maternal uncle; Heart disease in her maternal aunt. There is no history of Colon cancer, Stomach cancer, or Rectal cancer. She was adopted.   Allergies Allergies  Allergen Reactions   Ace Inhibitors Cough   Statins     Can take Crestor     Home Medications  Prior to Admission medications   Medication Sig Start Date End Date Taking? Authorizing Provider  aspirin EC 81 MG tablet Take 1 tablet (81 mg total) by mouth daily. 12/14/15   Leonie Man, MD  candesartan (ATACAND) 16 MG tablet Take 16 mg by mouth  daily. 04/07/19   [provider]  carvedilol (COREG) 6.25 MG tablet Take 1 tablet (6.25 mg total) by mouth 2 (two) times daily with a meal. 05/23/16   Leonie Man, MD  Cholecalciferol 1000 UNITS TBDP Take 1,000 Units by mouth every morning.     [provider]  glimepiride (AMARYL) 4 MG tablet Take 4 mg by mouth daily before breakfast.    [provider]  metFORMIN (GLUCOPHAGE) 500 MG tablet Take 500 mg by mouth 2 (two) times daily with a meal.  10/01/10   [provider]  metroNIDAZOLE (METROCREAM) 0.75 % cream Apply topically 2 (two) times daily. Apply to face    [provider]  nitroGLYCERIN (NITROSTAT) 0.4 MG SL tablet Place 1 tablet (0.4 mg total) under the tongue every 5 (five) minutes as needed for chest pain. 02/19/18   Lendon Colonel, NP  pantoprazole (PROTONIX) 40 MG tablet TAKE 1 TABLET (40 MG TOTAL) BY MOUTH DAILY. 12/31/18   Ladene Artist, MD  rosuvastatin (CRESTOR) 10 MG tablet Take 10 mg by mouth daily. 01/23/17   [provider]  triamcinolone cream (KENALOG) 0.1 % Apply 1 application topically as directed. 12/03/15   [provider]  valACYclovir (VALTREX) 1000 MG tablet TAKE 2 TABLETS TWICE A DAY (2 DOSES PER EPISODE) ORALLY 5 DAYS 01/23/17   [provider]     Critical care time:  45 minutes     CRITICAL CARE Performed by: Otilio Carpen Sanja Elizardo   Total critical care time: 45 minutes  Critical care time was exclusive of separately billable procedures and treating other patients.  Critical care was necessary to treat or prevent imminent or life-threatening deterioration.  Critical care was time spent personally  by me on the following activities: development of treatment plan with patient and/or surrogate as well as nursing, discussions with consultants, evaluation of patient's response to treatment, examination of patient, obtaining history from patient or surrogate, ordering and performing  treatments and interventions, ordering and review of laboratory studies, ordering and review of radiographic studies, pulse oximetry and re-evaluation of patient's condition.  Otilio Carpen Wren Gallaga, PA-C [facility] See Amion for pager If no response to pager , please call [phone] until 7pm After 7:00 pm call Elink  [phone]

## 2020-11-10 NOTE — ED Provider Notes (Signed)
  Provider Note MRN:  [id number]  Arrival date & time: 11/10/20    ED Course and Medical Decision Making  Assumed care from Dr. Rogene Houston at shift change.  Bilateral PEs with heart strain, increasing oxygen requirement here in the [place] emergency department.  No beds available.  Patient accepted for ED to ED transfer to [facility] by Dr. Darl Householder.  Patient is in no respiratory distress.  .Critical Care Performed by: Maudie Flakes, MD Authorized by: Maudie Flakes, MD   Critical care provider statement:    Critical care time (minutes):  35   Critical care was necessary to treat or prevent imminent or life-threatening deterioration of the following conditions:  Respiratory failure   Critical care was time spent personally by me on the following activities:  Discussions with consultants, evaluation of patient's response to treatment, examination of patient, ordering and performing treatments and interventions, ordering and review of laboratory studies, ordering and review of radiographic studies, pulse oximetry, re-evaluation of patient's condition, obtaining history from patient or surrogate and review of old charts   I assumed direction of critical care for this patient from another provider in my specialty: yes     Care discussed with: accepting provider at another facility    Final Clinical Impressions(s) / ED Diagnoses     ICD-10-CM   1. Acute pulmonary embolism with acute cor pulmonale, unspecified pulmonary embolism type [facility])  I26.09       ED Discharge Orders     None       Discharge Instructions   None     Barth Kirks. [place], [place] [email or web address]    Maudie Flakes, MD 11/10/20 [phone]

## 2020-11-10 NOTE — Progress Notes (Signed)
[place] Progress Note Patient Name: Sandra Beasley DOB: 20-Mar-1950 MRN: [id number]   Date of Service  11/10/2020  HPI/Events of Note  Patient requesting a sleep aid.  eICU Interventions  Melatonin 3 mg po Q Hs PRN insomnia x 2 days ordered.        Kerry Kass Jacki Couse 11/10/2020, 8:24 PM

## 2020-11-10 NOTE — Progress Notes (Addendum)
eLink Physician-Brief Progress Note Patient Name: Sandra Beasley DOB: July 18, 1950 MRN: [id number]   Date of Service  11/10/2020  HPI/Events of Note  70/F with hx of DM, hypertension, dyslipidemia, CAD, recently diagnosed with anemia, presenting with several week hx of shortness of breath, worse with exertion. Pt reportedly flew from [place] about a month prior.   CTA was done showing extensive bilateral PE with associated right heart strain. Troponin 75 --> 81.   Pt was started on heparin gtt but patient had increasing O2 requirements in the ED upto 5L via nasal cannula and was accepted for transfer to [facility].  On camera assessment, pt is awake and alert, able to speak in sentences.  BP 200/130, HR 80, RR 19, 95% on nasal cannula.   eICU Interventions  Submassive PE Hypertension CAD Dyslipidemia DM  Continue heparin gtt.  Doppler of lower extremities. Check 2decho. Restart oral-antihypertensives. Continue rosuvastatin. Insulin for glucose control. Continue O2 support.  Resume protonix 40mg .      Intervention Category Evaluation Type: New Patient Evaluation  Elsie Lincoln 11/10/2020, 3:28 AM

## 2020-11-10 NOTE — Telephone Encounter (Signed)
RN called and spoke to Dr Leonides Schanz . Information given Dr Leonides Schanz per Dr Ellyn Hack request.  Dr Leonides Schanz  was appreciative

## 2020-11-10 NOTE — Progress Notes (Signed)
  Echocardiogram 2D Echocardiogram has been performed.  Sandra Beasley F 11/10/2020, 3:29 PM

## 2020-11-10 NOTE — ED Notes (Signed)
Report called to Brianna, RN

## 2020-11-10 NOTE — ED Notes (Signed)
Pt placed on [place] 5 Lpm for desaturation into the 80's. RT will continue to monitor.

## 2020-11-10 NOTE — ED Notes (Addendum)
Patient monitor alarms for O2 saturation of 85% on 3L [place]. This nurse checks on patient. Patient instructed to breathe in through nose and out through mouth. Patient saturation increased to 90% and then dropped back to 86%. Patient oxygen increased to 5L per RT. MD notified, [facility] called by secretary to check on inpatient bed status. Bed not available.

## 2020-11-10 NOTE — Progress Notes (Signed)
Lower extremity venous bilateral study completed.   Please see CV Proc for preliminary results.   Keayra Graham, RDMS, RVT  

## 2020-11-10 NOTE — TOC Benefit Eligibility Note (Addendum)
Patient Teacher, English as a foreign language completed.    The patient is currently admitted and upon discharge could be taking Eliquis 5 mg.  The current 30 day co-pay is, $47.00.   The patient is currently admitted and upon discharge could be taking Xarelto 20 mg.  The current 30 day co-pay is, $47.00.   The patient is currently admitted and upon discharge could be taking Farxiga 10 mg.  The current 30 day co-pay is, $47.00.   The patient is currently admitted and upon discharge could be taking Jardiance 10 mg.  The current 30 day co-pay is, $47.00.   The patient is insured through [place], [place] Patient Advocate Specialist [place] Team Direct Number: [phone]  Fax: [phone]

## 2020-11-11 ENCOUNTER — Telehealth: Payer: Self-pay | Admitting: Pulmonary Disease

## 2020-11-11 DIAGNOSIS — I2609 Other pulmonary embolism with acute cor pulmonale: Secondary | ICD-10-CM | POA: Diagnosis not present

## 2020-11-11 LAB — BASIC METABOLIC PANEL
Anion gap: 9 (ref 5–15)
BUN: 25 mg/dL — ABNORMAL HIGH (ref 8–23)
CO2: 22 mmol/L (ref 22–32)
Calcium: 8.6 mg/dL — ABNORMAL LOW (ref 8.9–10.3)
Chloride: 104 mmol/L (ref 98–111)
Creatinine, Ser: 1.43 mg/dL — ABNORMAL HIGH (ref 0.44–1.00)
GFR, Estimated: 39 mL/min — ABNORMAL LOW (ref >60)
Glucose, Bld: 195 mg/dL — ABNORMAL HIGH (ref 70–99)
Potassium: 3.6 mmol/L (ref 3.5–5.1)
Sodium: 135 mmol/L (ref 135–145)

## 2020-11-11 LAB — GLUCOSE, CAPILLARY
Glucose-Capillary: 183 mg/dL — ABNORMAL HIGH (ref 70–99)
Glucose-Capillary: 204 mg/dL — ABNORMAL HIGH (ref 70–99)
Glucose-Capillary: 204 mg/dL — ABNORMAL HIGH (ref 70–99)
Glucose-Capillary: 214 mg/dL — ABNORMAL HIGH (ref 70–99)
Glucose-Capillary: 200 mg/dL — ABNORMAL HIGH (ref 70–99)
Glucose-Capillary: 190 mg/dL — ABNORMAL HIGH (ref 70–99)

## 2020-11-11 LAB — CBC
HCT: 34.8 % — ABNORMAL LOW (ref 36.0–46.0)
Hemoglobin: 10.2 g/dL — ABNORMAL LOW (ref 12.0–15.0)
MCH: 21.8 pg — ABNORMAL LOW (ref 26.0–34.0)
MCHC: 29.3 g/dL — ABNORMAL LOW (ref 30.0–36.0)
MCV: 74.4 fL — ABNORMAL LOW (ref 80.0–100.0)
Platelets: 213 10*3/uL (ref 150–400)
RBC: 4.68 MIL/uL (ref 3.87–5.11)
RDW: 17.4 % — ABNORMAL HIGH (ref 11.5–15.5)
WBC: 10.9 10*3/uL — ABNORMAL HIGH (ref 4.0–10.5)
nRBC: 0 % (ref 0.0–0.2)

## 2020-11-11 LAB — HEPARIN LEVEL (UNFRACTIONATED): Heparin Unfractionated: 0.64 IU/mL (ref 0.30–0.70)

## 2020-11-11 MED ORDER — APIXABAN 5 MG PO TABS: 5.0000 mg | ORAL_TABLET | Freq: Two times a day (BID) | ORAL | Status: DC

## 2020-11-11 MED ORDER — LABETALOL HCL 5 MG/ML IV SOLN: 20.0000 mg | Freq: Four times a day (QID) | INTRAVENOUS | Status: DC | PRN

## 2020-11-11 MED ORDER — POTASSIUM CHLORIDE 20 MEQ PO PACK
40.0000 meq | PACK | Freq: Once | ORAL | Status: AC
  Administered 2020-11-11: 40 meq via ORAL
  Filled 2020-11-11: qty 2

## 2020-11-11 MED ORDER — PANTOPRAZOLE SODIUM 40 MG IV SOLR
40.0000 mg | Freq: Every day | INTRAVENOUS | Status: DC
  Administered 2020-11-11 – 2020-11-13 (×3): 40 mg via INTRAVENOUS
  Filled 2020-11-11 (×3): qty 40

## 2020-11-11 MED ORDER — HYDROXYZINE HCL 25 MG PO TABS: 25.0000 mg | ORAL_TABLET | Freq: Three times a day (TID) | ORAL | Status: DC | PRN

## 2020-11-11 MED ORDER — ONDANSETRON HCL 4 MG/2ML IJ SOLN
4.0000 mg | Freq: Four times a day (QID) | INTRAMUSCULAR | Status: DC | PRN
  Administered 2020-11-11: 4 mg via INTRAVENOUS
  Filled 2020-11-11: qty 2

## 2020-11-11 MED ORDER — PANTOPRAZOLE SODIUM 40 MG PO TBEC
40.0000 mg | DELAYED_RELEASE_TABLET | Freq: Every evening | ORAL | Status: DC
  Filled 2020-11-11: qty 1

## 2020-11-11 MED ORDER — APIXABAN 5 MG PO TABS
10.0000 mg | ORAL_TABLET | Freq: Two times a day (BID) | ORAL | Status: DC
  Administered 2020-11-12 – 2020-11-13 (×3): 10 mg via ORAL
  Filled 2020-11-11 (×3): qty 2

## 2020-11-11 MED ORDER — PANTOPRAZOLE SODIUM 40 MG PO TBEC
40.0000 mg | DELAYED_RELEASE_TABLET | Freq: Every evening | ORAL | Status: DC
  Administered 2020-11-11: 40 mg via ORAL
  Filled 2020-11-11: qty 1

## 2020-11-11 MED ORDER — SUCRALFATE 1 G PO TABS
1.0000 g | ORAL_TABLET | Freq: Three times a day (TID) | ORAL | Status: AC
  Filled 2020-11-11 (×5): qty 1

## 2020-11-11 NOTE — Progress Notes (Addendum)
ANTICOAGULATION CONSULT NOTE - Initial Consult  Pharmacy Consult for IV Heparin Indication: new pulmonary embolus  Allergies  Allergen Reactions   Ace Inhibitors Cough   Other Cough    Spices make pt cough, sneeze, eyes water   Statins     Can take Crestor    Patient Measurements: Height: 5\' 2"  (157.5 cm) Weight: 103.1 kg (227 lb 4.7 oz) IBW/kg (Calculated) : 50.1 Heparin Dosing Weight: 75.1 kg  Vital Signs: Temp: 97.7 F (36.5 C) (10/05 0331) Temp Source: Oral (10/05 0331) BP: 127/76 (10/05 0600) Pulse Rate: 77 (10/05 0600)  Labs: Recent Labs    11/09/20 1642 11/09/20 1735 11/09/20 1858 11/09/20 1940 11/10/20 0516 11/10/20 1158 11/11/20 0217  HGB 10.3*  --   --   --  10.2*  --  10.2*  HCT 34.9*  --   --   --  34.0*  --  34.8*  PLT 218  --   --   --  210  --  213  LABPROT  --   --  14.1  --   --   --   --   INR  --   --  1.1  --   --   --   --   HEPARINUNFRC  --   --   --   --  0.61 0.57 0.64  CREATININE 1.45*  --   --   --  1.37*  --  1.43*  TROPONINIHS  --  75*  --  81*  --  72*  --      Estimated Creatinine Clearance: 40 mL/min (A) (by C-G formula based on SCr of 1.43 mg/dL (H)).   Medical History: Past Medical History:  Diagnosis Date   Adenomatous polyp of colon 09/2005   CAD S/P percutaneous coronary angioplasty - DES x2 in RCA 01/02/2013; 02/2013   a. 100% RPDA - Xience Xpedition DES 2.25 mm x 15 mm + 2.25 mm x 8 mm overlapping, mod- severe distal LAD and prox OM1 lesions.;b.  Myoview 02/2013 & Jan 2020: No ischemia or Infarction, Normal EF.   GERD (gastroesophageal reflux disease) 03/06/2013   Hyperlipidemia associated with type 2 diabetes mellitus ([place])    Hypertension    Obesity    STEMI (ST elevation myocardial infarction) ([place]) 01/02/13   --> PCI; EF 60 and 65%. Gr 1 DD & No regional WMA, Otherwise relatively normal.   Type 2 diabetes mellitus with circulatory disorder ([facility])    Vitamin D deficiency     Medications:  Medications Prior to  Admission  Medication Sig Dispense Refill Last Dose   aspirin EC 81 MG tablet Take 1 tablet (81 mg total) by mouth daily. 90 tablet 3 11/09/2020   candesartan (ATACAND) 16 MG tablet Take 16 mg by mouth daily.   11/09/2020   carvedilol (COREG) 6.25 MG tablet Take 1 tablet (6.25 mg total) by mouth 2 (two) times daily with a meal. 180 tablet 2 11/09/2020 at 1000   Cholecalciferol 1000 UNITS TBDP Take 1,000 Units by mouth every morning.    11/09/2020   Coenzyme Q10 (CO Q 10 PO) Take 1 capsule by mouth daily.   Past Week   glimepiride (AMARYL) 4 MG tablet Take 4 mg by mouth daily before breakfast.   11/09/2020   metFORMIN (GLUCOPHAGE) 500 MG tablet Take 1,000 mg by mouth 2 (two) times daily with a meal.   11/09/2020   metroNIDAZOLE (METROCREAM) 0.75 % cream Apply 1 application topically 2 (two) times daily as needed (  rosacea). Apply to face   Past Month   nitroGLYCERIN (NITROSTAT) 0.4 MG SL tablet Place 1 tablet (0.4 mg total) under the tongue every 5 (five) minutes as needed for chest pain. 25 tablet 1 unk   Omega-3 Fatty Acids (OMEGA 3 PO) Take 1 capsule by mouth daily.   11/09/2020   pantoprazole (PROTONIX) 40 MG tablet TAKE 1 TABLET (40 MG TOTAL) BY MOUTH DAILY. (Patient taking differently: Take 40 mg by mouth every evening.) 30 tablet 0 Past Week   rosuvastatin (CRESTOR) 10 MG tablet Take 10 mg by mouth daily.  4 11/09/2020   triamcinolone cream (KENALOG) 0.1 % Apply 1 application topically 2 (two) times daily as needed (rash/irritation).   Past Month    Scheduled:   carvedilol  6.25 mg Oral BID WC   Chlorhexidine Gluconate Cloth  6 each Topical Daily   insulin aspart  0-15 Units Subcutaneous Q4H   irbesartan  300 mg Oral Daily   mouth rinse  15 mL Mouth Rinse BID   rosuvastatin  10 mg Oral Daily   Infusions:   heparin 1,350 Units/hr (11/11/20 0600)   PRN:   Assessment: 43 yof presenting with SOB x 2 weeks and PMH of HTN, DM, CAD found to have submassive PE. Evidence of R heart strain. Likely  provoked by recent travel history 1 month ago. No personal or family history of clots. No PTA anticoagulation. Heparin per pharmacy consult placed for pulmonary embolus.  Hgb 10.9; plt 213  - stable   Heparin level: 0.64, therapeutic  No s/s of bleeding noted  Goal of Therapy:  Heparin level 0.3-0.7 units/ml Monitor platelets by anticoagulation protocol: Yes   Plan:  Continue heparin infusion at 1350 units/hr Check daily heparin levels Monitor CBC and plans for transition to [place]  Thank you for allowing pharmacy to participate in this patient's care.  Levonne Spiller, PharmD PGY1 Acute Care Resident  11/11/2020,6:42 AM    _________________________________________________________________________ Addendum  Pharmacy consulted to stop heparin and start apixaban on 10/6. Apixaban cost $47. Pt stable with no s/s of bleeding.  Plan Decrease heparin infusion to 1300 units/hr Start apixaban AM 10/6, will d/c heparin infusion with morning dose  Thank you for allowing pharmacy to participate in this patient's care.  Levonne Spiller, PharmD PGY1 Acute Care Resident  11/11/2020,9:45 AM

## 2020-11-11 NOTE — Evaluation (Signed)
Physical Therapy Evaluation Patient Details Name: Sandra Beasley MRN: [id number] DOB: May 01, 1950 Today's Date: 11/11/2020  History of Present Illness  pt is a 70 y/o female admitte with worsening SOB over a 2 week period.  Imaging found acute Bilateral PE, likely provoked in the setting of recent travel, patient also with risk factor of obesity.Evidence of RH strain found on CT, echo with EF 55 to 60%. RV systolic function severely reduced and RV size mildly enlarged.   Normal valvular function. Venous duplexes notable for bilateral acute DVTs, R posterior tibial, L posterior tibial and peroneal veins.  PMHx:   HTN, Type 2 DM, CAD, HL, prior STEMI  Clinical Impression  Pt admitted with/for progressing  SOB due to bil PE.  Pt is mobilizing at a min guard to supervision level. Pt currently limited functionally due to the problems listed below.  (see problems list.)  Pt will benefit from PT to maximize function and safety to be able to get home safely with available assist        Recommendations for follow up therapy are one component of a multi-disciplinary discharge planning process, led by the attending physician.  Recommendations may be updated based on patient status, additional functional criteria and insurance authorization.  Follow Up Recommendations No PT follow up;Supervision - Intermittent    Equipment Recommendations  None recommended by PT    Recommendations for Other Services       Precautions / Restrictions Precautions Precautions: Fall Restrictions Weight Bearing Restrictions: No      Mobility  Bed Mobility Overal bed mobility: Needs Assistance Bed Mobility: Supine to Sit;Sit to Supine     Supine to sit: Supervision Sit to supine: Supervision   General bed mobility comments: transitions without struggle    Transfers Overall transfer level: Needs assistance   Transfers: Sit to/from Stand Sit to Stand: Supervision         General transfer comment: used  UE's appropriately  Ambulation/Gait Ambulation/Gait assistance: Min guard Gait Distance (Feet): 180 Feet Assistive device: None;IV Pole Gait Pattern/deviations: Step-through pattern   Gait velocity interpretation: <1.8 ft/sec, indicate of risk for recurrent falls General Gait Details: generally steady with/without IV pole.  mild SOB, sats on3L [place] in the upper 90's, HR rose into the 100's,  Stairs            Wheelchair Mobility    Modified Rankin (Stroke Patients Only)       Balance Overall balance assessment: Needs assistance Sitting-balance support: No upper extremity supported;Feet supported Sitting balance-Leahy Scale: Good     Standing balance support: No upper extremity supported;During functional activity Standing balance-Leahy Scale: Fair                               Pertinent Vitals/Pain Pain Assessment: No/denies pain    Home Living Family/patient expects to be discharged to:: Private residence Living Arrangements: Alone Available Help at Discharge: Family;Available PRN/intermittently;Other (Comment) (Dtr coming in to assist for a few days) Type of Home: House Home Access: Stairs to enter Entrance Stairs-Rails: Psychiatric nurse of Steps: 2 Home Layout: Two level;1/2 bath on main level Home Equipment: Shower seat;Grab bars - toilet;Grab bars - tub/shower      Prior Function Level of Independence: Independent               Hand Dominance        Extremity/Trunk Assessment   Upper Extremity Assessment Upper Extremity Assessment: Overall  WFL for tasks assessed    Lower Extremity Assessment Lower Extremity Assessment: Overall WFL for tasks assessed (mild general weakness)    Cervical / Trunk Assessment Cervical / Trunk Assessment: Normal  Communication   Communication: No difficulties  Cognition Arousal/Alertness: Awake/alert Behavior During Therapy: WFL for tasks assessed/performed Overall Cognitive  Status: Within Functional Limits for tasks assessed                                        General Comments      Exercises     Assessment/Plan    PT Assessment Patient needs continued PT services  PT Problem List Decreased strength;Decreased activity tolerance;Decreased mobility;Cardiopulmonary status limiting activity       PT Treatment Interventions Gait training;Stair training;Functional mobility training;Therapeutic activities;Patient/family education    PT Goals (Current goals can be found in the Care Plan section)  Acute Rehab PT Goals Patient Stated Goal: back to work, back to work out at [employer] when appropriate PT Goal Formulation: With patient Time For Goal Achievement: 11/25/20 Potential to Achieve Goals: Good    Frequency Min 3X/week   Barriers to discharge        Co-evaluation               AM-PAC PT "6 Clicks" Mobility  Outcome Measure Help needed turning from your back to your side while in a flat bed without using bedrails?: None Help needed moving from lying on your back to sitting on the side of a flat bed without using bedrails?: A Little Help needed moving to and from a bed to a chair (including a wheelchair)?: A Little Help needed standing up from a chair using your arms (e.g., wheelchair or bedside chair)?: A Little Help needed to walk in hospital room?: A Little Help needed climbing 3-5 steps with a railing? : A Little 6 Click Score: 19    End of Session Equipment Utilized During Treatment: Oxygen Activity Tolerance: Patient tolerated treatment well Patient left: in bed;with call bell/phone within reach Nurse Communication: Mobility status PT Visit Diagnosis: Other abnormalities of gait and mobility (R26.89);Difficulty in walking, not elsewhere classified (R26.2)    Time: 5697-9480 PT Time Calculation (min) (ACUTE ONLY): 35 min   Charges:   PT Evaluation $PT Eval Moderate Complexity: 1 Mod PT  Treatments $Gait Training: 8-22 mins        11/11/2020  Sandra Beasley., PT Acute Rehabilitation Services [phone]  (pager) [phone]  (office)  Tessie Fass Shiza Thelen 11/11/2020, 5:51 PM

## 2020-11-11 NOTE — Telephone Encounter (Signed)
Please schedule patient for follow up with me in 4-6 weeks for Pulmonary Emboli and DVT.  Thanks, Wille Glaser

## 2020-11-11 NOTE — Progress Notes (Signed)
NAME:  Sandra Beasley, MRN:  [id number], DOB:  05-Sep-1950, LOS: 1 ADMISSION DATE:  11/09/2020, CONSULTATION DATE:  11/11/20 REFERRING MD:  EDP, CHIEF COMPLAINT:  Shortness of breath   History of Present Illness:  Sandra Beasley is a 70 y.o. F with PMH of HTN, Type 2 DM, CAD, HL, prior STEMI who started developing exertional shortness of breath approximately two weeks ago.    She thought she had anemia and saw her PCP who started iron, however her symptoms worsened so she presented to the [place] ED.  She notes a plane trip to [place] one month ago, denies significant leg edema, personal history of malignancy, hormone replacement or personal or family history of clots.   In the ED, she required nasal cannula O2, no hypotension or tachycardia.   CTA chest with extensive bilateral pulmonary emboli.  She was started on heparin and transferred to [facility].   Pertinent  Medical History   has a past medical history of Adenomatous polyp of colon (09/2005), CAD S/P percutaneous coronary angioplasty - DES x2 in RCA (01/02/2013; 02/2013), GERD (gastroesophageal reflux disease) (03/06/2013), Hyperlipidemia associated with type 2 diabetes mellitus ([place]), Hypertension, Obesity, STEMI (ST elevation myocardial infarction) ([place]) (01/02/13), Type 2 diabetes mellitus with circulatory disorder ([place]), and Vitamin D deficiency.   Significant Hospital Events: Including procedures, antibiotic start and stop dates in addition to other pertinent events   10/4 Admit to [facility] ICU, transfer from [place], 5L [place] 10/4 heparin gtt   Interim History / Subjective:  Pt awake and in no distress. States she had some mild GI upset after ingesting oral potassium pills. She feels her body aches are improved this AM and that she was able to get some sleep. She denies feeling short of breath or having any chest pain.   Objective   Blood pressure 137/75, pulse 78, temperature 98.1 F (36.7 C), temperature source Oral, resp.  rate (!) 22, height 5\' 2"  (1.575 m), weight 103.1 kg, SpO2 95 %.        Intake/Output Summary (Last 24 hours) at 11/11/2020 1117 Last data filed at 11/11/2020 0800 Gross per 24 hour  Intake 377.84 ml  Output 450 ml  Net -72.16 ml    Filed Weights   11/09/20 1349 11/10/20 0320  Weight: 104.3 kg 103.1 kg   General:  well-nourished, awake and in no distress HEENT: MM pink/moist Neuro: awake, oriented x4, no focal deficits  CV: s1s2 rrr, no m/r/g PULM:  Non labored breathing, clear bilaterally, 3L [place] GI: soft, bsx4 active  Extremities: warm/dry, no edema  Skin: no rashes or lesions   Resolved Hospital Problem list     Assessment & Plan:   Acute Bilateral PE Likely provoked in the setting of recent travel, patient also with risk factor of obesity. Evidence of RH strain on CT, Echo with EF 55 to 60%. RV systolic function severely reduced and RV size mildly enlarged. PASP moderately elevated. Normal valvular function. Venous duplexes notable for bilateral acute DVTs, R posterior tibial, L posterior tibial and peroneal veins.  Troponin peaked 81, BNP 1000 PESI score 92, class III and intermediate high risk given her PESI class, signs of RV dysfunction, and elevated cardiac biomarkers.   P: -continue heparin given HDS and stable O2 requirement for 1 more day and transition to NOAC 10/6.  -monitor for signs of worsening hemodynamic instability though stable since 10/3  -Likely can continue on therapeutic AC for 6 months given her obesity and  consider transition to reduced intensity dosing for VTE prophylactic dose  Acute Hypoxic Respiratory failure  Secondary to the above. Stable O2 requirement.  P: -continue O2 supplementation with nasal cannula as needed   Hypertension, CAD Patient required 1x dose of PO hydralazine ON. SBPs mostly in 130s-140s  P: -Continue home Coreg and irbesartan while inpatient (home ARB not on formulary) -prn PO hydralazine  Type 2 DM -SSI, hold home  Metformin and Glimepiride for now    Best Practice (right click and "Reselect all SmartList Selections" daily)   Diet/type: Regular consistency (see orders) DVT prophylaxis: systemic heparin transition to NOAC 10/6  GI prophylaxis: N/A Lines: N/A Foley:  N/A Code Status:  full code   Labs   CBC: Recent Labs  Lab 11/09/20 1642 11/10/20 0516 11/11/20 0217  WBC 7.6 9.0 10.9*  NEUTROABS 5.5  --   --   HGB 10.3* 10.2* 10.2*  HCT 34.9* 34.0* 34.8*  MCV 74.3* 74.1* 74.4*  PLT 218 210 213     Basic Metabolic Panel: Recent Labs  Lab 11/09/20 1642 11/10/20 0516 11/11/20 0217  NA 139 138 135  K 3.5 3.0* 3.6  CL 106 103 104  CO2 20* 22 22  GLUCOSE 240* 221* 195*  BUN 25* 20 25*  CREATININE 1.45* 1.37* 1.43*  CALCIUM 8.8* 8.9 8.6*    GFR: Estimated Creatinine Clearance: 40 mL/min (A) (by C-G formula based on SCr of 1.43 mg/dL (H)). Recent Labs  Lab 11/09/20 1642 11/10/20 0516 11/10/20 1158 11/11/20 0217  WBC 7.6 9.0  --  10.9*  LATICACIDVEN  --   --  1.3  --      Liver Function Tests: No results for input(s): AST, ALT, ALKPHOS, BILITOT, PROT, ALBUMIN in the last 168 hours. No results for input(s): LIPASE, AMYLASE in the last 168 hours. No results for input(s): AMMONIA in the last 168 hours.  ABG    Component Value Date/Time   TCO2 23 01/02/2013 1753      Coagulation Profile: Recent Labs  Lab 11/09/20 1858  INR 1.1     Cardiac Enzymes: No results for input(s): CKTOTAL, CKMB, CKMBINDEX, TROPONINI in the last 168 hours.  HbA1C: Hgb A1c MFr Bld  Date/Time Value Ref Range Status  11/10/2020 05:16 AM 7.0 (H) 4.8 - 5.6 % Final    Comment:    (NOTE) Pre diabetes:          5.7%-6.4%  Diabetes:              >6.4%  Glycemic control for   <7.0% adults with diabetes   01/03/2013 02:45 AM 8.2 (H) <5.7 % Final    Comment:    (NOTE)                                                                       According to the ADA Clinical Practice  Recommendations for 2011, when HbA1c is used as a screening test:  >=6.5%   Diagnostic of Diabetes Mellitus           (if abnormal result is confirmed) 5.7-6.4%   Increased risk of developing Diabetes Mellitus References:Diagnosis and Classification of Diabetes Mellitus,Diabetes WLNL,8921,19(ERDEY 1):S62-S69 and Standards of Medical Care in  Diabetes - 2011,Diabetes Care,2011,34 (Suppl 1):S11-S61.    CBG: Recent Labs  Lab 11/10/20 1512 11/10/20 1927 11/10/20 2330 11/11/20 0321 11/11/20 0800  GLUCAP 163* 187* 230* 183* 204*     Review of Systems:   Negative except as noted in HPI  Past Medical History:  She,  has a past medical history of Adenomatous polyp of colon (09/2005), CAD S/P percutaneous coronary angioplasty - DES x2 in RCA (01/02/2013; 02/2013), GERD (gastroesophageal reflux disease) (03/06/2013), Hyperlipidemia associated with type 2 diabetes mellitus ([place]), Hypertension, Obesity, STEMI (ST elevation myocardial infarction) ([place]) (01/02/13), Type 2 diabetes mellitus with circulatory disorder ([place]), and Vitamin D deficiency.   Surgical History:   Past Surgical History:  Procedure Laterality Date   ABDOMINAL HYSTERECTOMY     CORONARY ANGIOPLASTY WITH STENT PLACEMENT  01/02/2013    PCI-RCA: Xience Xpedition 2.25 mm x 15 mm, 2.25 mm x 8 mm (2.4 mm)   LAPAROSCOPIC GASTRIC BANDING  2006   LAPAROSCOPIC HYSTERECTOMY  1996   LEFT HEART CATHETERIZATION WITH CORONARY ANGIOGRAM N/A 01/02/2013   Procedure: LEFT HEART CATHETERIZATION WITH CORONARY ANGIOGRAM;  Surgeon: Leonie Man, MD;  Location: [facility] C[facility] LAB;: Inferior STEMI: 100% PDA; mid-distal LAD beyond D1 (small caliber) 80%.  Ostial D1 40%.  Lateral OM 1 with 70-80% stenosis.  Large branching ramus intermedius with OM and DIAG branch branches.   NM MYOVIEW LTD  01/'15; 1/'20   a) EF 60-65%.  Normal wall motion.  No ischemia or infarction.; b) EF 70 to 75%.  No EKG changes.  No evidence of ischemia or infarction.:    TONSILLECTOMY  1966   TRANSTHORACIC ECHOCARDIOGRAM  01/04/2013   (Post inferior STEMI) EF 60 and 65%. Grade 1 diastolic dysfunction with no regional wall motion abnormalities. Otherwise relatively normal.     Social History:   reports that she has never smoked. She has never used smokeless tobacco. She reports that she does not drink alcohol and does not use drugs.   Family History:  Her family history includes Breast cancer in her maternal aunt; Diabetes in her maternal aunt and maternal uncle; Diabetes gravidarum in her maternal uncle; Heart disease in her maternal aunt. There is no history of Colon cancer, Stomach cancer, or Rectal cancer. She was adopted.   Allergies Allergies  Allergen Reactions   Ace Inhibitors Cough   Other Cough    Spices make pt cough, sneeze, eyes water   Statins     Can take Crestor     Home Medications  Prior to Admission medications   Medication Sig Start Date End Date Taking? Authorizing Provider  aspirin EC 81 MG tablet Take 1 tablet (81 mg total) by mouth daily. 12/14/15   Leonie Man, MD  candesartan (ATACAND) 16 MG tablet Take 16 mg by mouth daily. 04/07/19   [provider]  carvedilol (COREG) 6.25 MG tablet Take 1 tablet (6.25 mg total) by mouth 2 (two) times daily with a meal. 05/23/16   Leonie Man, MD  Cholecalciferol 1000 UNITS TBDP Take 1,000 Units by mouth every morning.     [provider]  glimepiride (AMARYL) 4 MG tablet Take 4 mg by mouth daily before breakfast.    [provider]  metFORMIN (GLUCOPHAGE) 500 MG tablet Take 500 mg by mouth 2 (two) times daily with a meal.  10/01/10   [provider]  metroNIDAZOLE (METROCREAM) 0.75 % cream Apply topically 2 (two) times daily. Apply to face    [provider]  nitroGLYCERIN (  NITROSTAT) 0.4 MG SL tablet Place 1 tablet (0.4 mg total) under the tongue every 5 (five) minutes as needed for chest pain. 02/19/18   Lendon Colonel, NP   pantoprazole (PROTONIX) 40 MG tablet TAKE 1 TABLET (40 MG TOTAL) BY MOUTH DAILY. 12/31/18   Ladene Artist, MD  rosuvastatin (CRESTOR) 10 MG tablet Take 10 mg by mouth daily. 01/23/17   [provider]  triamcinolone cream (KENALOG) 0.1 % Apply 1 application topically as directed. 12/03/15   [provider]  valACYclovir (VALTREX) 1000 MG tablet TAKE 2 TABLETS TWICE A DAY (2 DOSES PER EPISODE) ORALLY 5 DAYS 01/23/17   [provider]    Rick Duff, MD PGY-2 Internal Medicine  Pager [phone]

## 2020-11-12 DIAGNOSIS — I2699 Other pulmonary embolism without acute cor pulmonale: Secondary | ICD-10-CM

## 2020-11-12 LAB — GLUCOSE, CAPILLARY
Glucose-Capillary: 141 mg/dL — ABNORMAL HIGH (ref 70–99)
Glucose-Capillary: 152 mg/dL — ABNORMAL HIGH (ref 70–99)
Glucose-Capillary: 170 mg/dL — ABNORMAL HIGH (ref 70–99)
Glucose-Capillary: 170 mg/dL — ABNORMAL HIGH (ref 70–99)
Glucose-Capillary: 199 mg/dL — ABNORMAL HIGH (ref 70–99)
Glucose-Capillary: 199 mg/dL — ABNORMAL HIGH (ref 70–99)

## 2020-11-12 LAB — HEPARIN LEVEL (UNFRACTIONATED): Heparin Unfractionated: 0.7 IU/mL (ref 0.30–0.70)

## 2020-11-12 LAB — PARATHYROID HORMONE, INTACT (NO CA): PTH: 45 pg/mL (ref 15–65)

## 2020-11-12 NOTE — Discharge Instructions (Signed)
Information on my medicine - ELIQUIS (apixaban)  This medication education was reviewed with me or my healthcare representative as part of my discharge preparation. Why was Eliquis prescribed for you? Eliquis was prescribed to treat blood clots that may have been found in the veins of your legs (deep vein thrombosis) or in your lungs (pulmonary embolism) and to reduce the risk of them occurring again.  What do You need to know about Eliquis ? The starting dose is 10 mg (two 5 mg tablets) taken TWICE daily for the FIRST SEVEN (7) DAYS, then on 11/19/2020  the dose is reduced to ONE 5 mg tablet taken TWICE daily.  Eliquis may be taken with or without food.   Try to take the dose about the same time in the morning and in the evening. If you have difficulty swallowing the tablet whole please discuss with your pharmacist how to take the medication safely.  Take Eliquis exactly as prescribed and DO NOT stop taking Eliquis without talking to the doctor who prescribed the medication.  Stopping may increase your risk of developing a new blood clot.  Refill your prescription before you run out.  After discharge, you should have regular check-up appointments with your healthcare provider that is prescribing your Eliquis.    What do you do if you miss a dose? If a dose of ELIQUIS is not taken at the scheduled time, take it as soon as possible on the same day and twice-daily administration should be resumed. The dose should not be doubled to make up for a missed dose.  Important Safety Information A possible side effect of Eliquis is bleeding. You should call your healthcare provider right away if you experience any of the following: Bleeding from an injury or your nose that does not stop. Unusual colored urine (red or dark brown) or unusual colored stools (red or black). Unusual bruising for unknown reasons. A serious fall or if you hit your head (even if there is no bleeding).  Some medicines  may interact with Eliquis and might increase your risk of bleeding or clotting while on Eliquis. To help avoid this, consult your healthcare provider or pharmacist prior to using any new prescription or non-prescription medications, including herbals, vitamins, non-steroidal anti-inflammatory drugs (NSAIDs) and supplements.  This website has more information on Eliquis (apixaban): http://www.eliquis.com/eliquis/home

## 2020-11-12 NOTE — Progress Notes (Signed)
Pulse oximetry while ambulation: Oxygen saturation on RA at rest- 88% Oxygen saturation on RA with ambulation- 86% Oxygen saturation with 3L [place] on rest- 94% Oxygen saturation with 3L [place] with ambulation- 96%.

## 2020-11-12 NOTE — Progress Notes (Addendum)
[facility]  PROGRESS NOTE  Sandra Beasley IWP:[id number] DOB: 11/07/1950 DOA: 11/09/2020 PCP: Lawerance Cruel, MD   Brief HPI:   70 year old woman with history of hypertension, diabetes mellitus type 2, CAD, hyperlipidemia presented with progressive dyspnea.  Found to be in acute hypoxemic respiratory failure and bilateral pulmonary emboli.  She recently traveled to [place] where airplane.  No history of previous blood clots.  Echocardiogram showed severe right heart strain, lower extremity Doppler showed bilateral DVTs.  She was initially in ICU, started on IV heparin drip.  Hemodynamically remained stable, did not require tPA or IR thrombectomy.  PCCM signed off and recommended 6 months of anticoagulation.    Subjective   Patient seen and examined, still requiring 3 L/min of oxygen via nasal cannula.  Dyspnea has improved.   Assessment/Plan:    Acute hypoxemic respiratory failure/cor pulmonale -Secondary to acute pulmonary emboli and bilateral DVTs -Hemodynamically she is stable -She will require 3 to 6 months of anticoagulation since it was a provoked DVT/PE -Started on apixaban -Did not require IR thrombectomy or tPA -Echocardiogram did show right heart strain -She is currently requiring 3 L/min of oxygen via nasal cannula  Hypertension -Continue Coreg, losartan  Hyperlipidemia -Continue rosuvastatin  Diabetes mellitus type 2 -Hemoglobin A1c 7.0 -Continue sliding scale insulin with NovoLog -CBG well controlled -Patient takes metformin and Amaryl at home.  Both these medications are currently on hold  CKD stage IIIb -Creatinine at baseline     Scheduled medications:    apixaban  10 mg Oral BID   Followed by   Derrill Memo ON 11/19/2020] apixaban  5 mg Oral BID   carvedilol  6.25 mg Oral BID WC   Chlorhexidine Gluconate Cloth  6 each Topical Daily   insulin aspart  0-15 Units Subcutaneous Q4H   irbesartan  300 mg Oral Daily   mouth rinse  15 mL Mouth Rinse BID    pantoprazole (PROTONIX) IV  40 mg Intravenous Daily   rosuvastatin  10 mg Oral Daily     Data Reviewed:   CBG:  Recent Labs  Lab 11/11/20 2058 11/12/20 0017 11/12/20 0507 11/12/20 0745 11/12/20 1214  GLUCAP 190* 170* 170* 141* 199*    SpO2: 98 % O2 Flow Rate (L/min): 3 L/min    Vitals:   11/12/20 0505 11/12/20 0736 11/12/20 0746 11/12/20 1215  BP: 120/76  130/72 126/68  Pulse: 72  66 67  Resp: 19  20 20   Temp: 98 F (36.7 C)  98.1 F (36.7 C) 97.9 F (36.6 C)  TempSrc: Oral  Oral Oral  SpO2: 97% 94% 98% 98%  Weight:      Height:         Intake/Output Summary (Last 24 hours) at 11/12/2020 1548 Last data filed at 11/12/2020 1037 Gross per 24 hour  Intake 467.16 ml  Output --  Net 467.16 ml    10/04 1901 - 10/06 0700 In: 505 [P.O.:120; I.V.:421] Out: -   Filed Weights   11/09/20 1349 11/10/20 0320  Weight: 104.3 kg 103.1 kg    Data Reviewed: Basic Metabolic Panel: Recent Labs  Lab 11/09/20 1642 11/10/20 0516 11/11/20 0217  NA 139 138 135  K 3.5 3.0* 3.6  CL 106 103 104  CO2 20* 22 22  GLUCOSE 240* 221* 195*  BUN 25* 20 25*  CREATININE 1.45* 1.37* 1.43*  CALCIUM 8.8* 8.9 8.6*   Liver Function Tests: No results for input(s): AST, ALT, ALKPHOS, BILITOT, PROT, ALBUMIN in the last 168 hours.  No results for input(s): LIPASE, AMYLASE in the last 168 hours. No results for input(s): AMMONIA in the last 168 hours. CBC: Recent Labs  Lab 11/09/20 1642 11/10/20 0516 11/11/20 0217  WBC 7.6 9.0 10.9*  NEUTROABS 5.5  --   --   HGB 10.3* 10.2* 10.2*  HCT 34.9* 34.0* 34.8*  MCV 74.3* 74.1* 74.4*  PLT 218 210 213   Cardiac Enzymes: No results for input(s): CKTOTAL, CKMB, CKMBINDEX, TROPONINI in the last 168 hours. BNP (last 3 results) Recent Labs    11/09/20 1642  BNP 1,080.1*    ProBNP (last 3 results) No results for input(s): PROBNP in the last 8760 hours.  CBG: Recent Labs  Lab 11/11/20 2058 11/12/20 0017 11/12/20 0507  11/12/20 0745 11/12/20 1214  GLUCAP 190* 170* 170* 141* 199*       Radiology Reports  VAS [place] LOWER EXTREMITY VENOUS (DVT)  Result Date: 11/10/2020  Lower Venous DVT Study Patient Name:  Sandra Beasley  Date of Exam:   11/10/2020 Medical Rec #: [id number]        Accession #:    [id number] Date of Birth: Feb 14, 1950         Patient Gender: F Patient Age:   8 years Exam Location:  [facility] Procedure:      VAS [place] LOWER EXTREMITY VENOUS (DVT) Referring Phys: Marshell Garfinkel --------------------------------------------------------------------------------  Indications: Pulmonary embolism.  Anticoagulation: Heparin. Comparison Study: No prior studies Performing Technologist: Darlin Coco RDMS, RVT  Examination Guidelines: A complete evaluation includes B-mode imaging, spectral Doppler, color Doppler, and power Doppler as needed of all accessible portions of each vessel. Bilateral testing is considered an integral part of a complete examination. Limited examinations for reoccurring indications may be performed as noted. The reflux portion of the exam is performed with the patient in reverse Trendelenburg.  +---------+---------------+---------+-----------+----------+--------------+ RIGHT    CompressibilityPhasicitySpontaneityPropertiesThrombus Aging +---------+---------------+---------+-----------+----------+--------------+ CFV      Full           No       Yes                                 +---------+---------------+---------+-----------+----------+--------------+ SFJ      Full                                                        +---------+---------------+---------+-----------+----------+--------------+ FV Prox  Full                                                        +---------+---------------+---------+-----------+----------+--------------+ FV Mid   Full                                                         +---------+---------------+---------+-----------+----------+--------------+ FV DistalFull                                                        +---------+---------------+---------+-----------+----------+--------------+  PFV      Full                                                        +---------+---------------+---------+-----------+----------+--------------+ POP      Full           No       Yes                                 +---------+---------------+---------+-----------+----------+--------------+ PTV      None                                                        +---------+---------------+---------+-----------+----------+--------------+ PERO     Full                                                        +---------+---------------+---------+-----------+----------+--------------+   +---------+---------------+---------+-----------+----------+--------------+ LEFT     CompressibilityPhasicitySpontaneityPropertiesThrombus Aging +---------+---------------+---------+-----------+----------+--------------+ CFV      Full           No       Yes                                 +---------+---------------+---------+-----------+----------+--------------+ SFJ      Full                                                        +---------+---------------+---------+-----------+----------+--------------+ FV Prox  Full                                                        +---------+---------------+---------+-----------+----------+--------------+ FV Mid   Full                                                        +---------+---------------+---------+-----------+----------+--------------+ FV DistalFull                                                        +---------+---------------+---------+-----------+----------+--------------+ PFV      Full                                                         +---------+---------------+---------+-----------+----------+--------------+  POP      Full           No       Yes                                 +---------+---------------+---------+-----------+----------+--------------+ PTV      None                                                        +---------+---------------+---------+-----------+----------+--------------+ PERO     None                                                        +---------+---------------+---------+-----------+----------+--------------+     Summary: RIGHT: - Findings consistent with acute deep vein thrombosis involving the right posterior tibial veins. - No cystic structure found in the popliteal fossa.  LEFT: - Findings consistent with acute deep vein thrombosis involving the left posterior tibial veins, and left peroneal veins. - No cystic structure found in the popliteal fossa.  Pulsatile lower extremity venous flow is suggestive of possibly elevated right heart pressure.  *See table(s) above for measurements and observations. Electronically signed by Deitra Mayo MD on 11/10/2020 at 5:30:20 PM.    Final        Antibiotics: Anti-infectives (From admission, onward)    None         DVT prophylaxis: Apixaban  Code Status: Full code  Family Communication: No family at bedside   Consultants: PCCM  Procedures:     Objective    Physical Examination:   General-appears in no acute distress Heart-S1-S2, regular, no murmur auscultated Lungs-clear to auscultation bilaterally, no wheezing or crackles auscultated Abdomen-soft, nontender, no organomegaly Extremities-no edema in the lower extremities Neuro-alert, oriented x3, no focal deficit noted  Status is: Inpatient  Dispo: The patient is from: Home              Anticipated d/c is to: Home              Anticipated d/c date is: 11/13/2020              Patient currently not stable for discharge  Barrier to [place], will  discharge home in a.m.  COVID-19 Labs  Recent Labs    11/09/20 1858  DDIMER 2.48*    Lab Results  Component Value Date   SARSCOV2NAA NEGATIVE 11/09/2020            Recent Results (from the past 240 hour(s))  Resp Panel by RT-PCR (Flu A&B, Covid) Nasopharyngeal Swab     Status: None   Collection Time: 11/09/20  5:51 PM   Specimen: Nasopharyngeal Swab; Nasopharyngeal(NP) swabs in vial transport medium  Result Value Ref Range Status   SARS Coronavirus 2 by RT PCR NEGATIVE NEGATIVE Final    Comment: (NOTE) SARS-CoV-2 target nucleic acids are NOT DETECTED.  The SARS-CoV-2 RNA is generally detectable in upper respiratory specimens during the acute phase of infection. The lowest concentration of SARS-CoV-2 viral copies this assay can detect is 138 copies/mL. A negative result does not preclude SARS-Cov-2 infection and should not be  used as the sole basis for treatment or other patient management decisions. A negative result may occur with  improper specimen collection/handling, submission of specimen other than nasopharyngeal swab, presence of viral mutation(s) within the areas targeted by this assay, and inadequate number of viral copies(<138 copies/mL). A negative result must be combined with clinical observations, patient history, and epidemiological information. The expected result is Negative.  Fact Sheet for Patients:  EntrepreneurPulse.com.au  Fact Sheet for Healthcare Providers:  IncredibleEmployment.be  This test is no t yet approved or cleared by the [place] FDA and  has been authorized for detection and/or diagnosis of SARS-CoV-2 by FDA under an Emergency Use Authorization (EUA). This EUA will remain  in effect (meaning this test can be used) for the duration of the COVID-19 declaration under Section 564(b)(1) of the Act, 21 U.S.C.section 360bbb-3(b)(1), unless the authorization is terminated  or revoked sooner.        Influenza A by PCR NEGATIVE NEGATIVE Final   Influenza B by PCR NEGATIVE NEGATIVE Final    Comment: (NOTE) The Xpert Xpress SARS-CoV-2/FLU/RSV plus assay is intended as an aid in the diagnosis of influenza from Nasopharyngeal swab specimens and should not be used as a sole basis for treatment. Nasal washings and aspirates are unacceptable for Xpert Xpress SARS-CoV-2/FLU/RSV testing.  Fact Sheet for Patients: EntrepreneurPulse.com.au  Fact Sheet for Healthcare Providers: IncredibleEmployment.be  This test is not yet approved or cleared by the [place] FDA and has been authorized for detection and/or diagnosis of SARS-CoV-2 by FDA under an Emergency Use Authorization (EUA). This EUA will remain in effect (meaning this test can be used) for the duration of the COVID-19 declaration under Section 564(b)(1) of the Act, 21 U.S.C. section 360bbb-3(b)(1), unless the authorization is terminated or revoked.  Performed at [facility], [address] [place], [place] [address]   MRSA Next Gen by PCR, Nasal     Status: None   Collection Time: 11/10/20  3:22 AM   Specimen: Nasal Mucosa; Nasal Swab  Result Value Ref Range Status   MRSA by PCR Next Gen NOT DETECTED NOT DETECTED Final    Comment: (NOTE) The GeneXpert MRSA Assay (FDA approved for NASAL specimens only), is one component of a comprehensive MRSA colonization surveillance program. It is not intended to diagnose MRSA infection nor to guide or monitor treatment for MRSA infections. Test performance is not FDA approved in patients less than 40 years old. Performed at [facility], [place] [address], [place], [place] [address]     [facility] If 7PM-7AM, please contact night-coverage at [email or web address], Office  [phone]   11/12/2020, 3:48 PM  LOS: 2 days

## 2020-11-12 NOTE — Telephone Encounter (Signed)
FYI for JD  Pt is scheduled to see you for HFU on 11/11 at 1030

## 2020-11-13 ENCOUNTER — Other Ambulatory Visit (HOSPITAL_COMMUNITY): Payer: Self-pay

## 2020-11-13 DIAGNOSIS — I2699 Other pulmonary embolism without acute cor pulmonale: Secondary | ICD-10-CM | POA: Diagnosis not present

## 2020-11-13 DIAGNOSIS — I2609 Other pulmonary embolism with acute cor pulmonale: Secondary | ICD-10-CM | POA: Diagnosis not present

## 2020-11-13 DIAGNOSIS — J9601 Acute respiratory failure with hypoxia: Secondary | ICD-10-CM | POA: Diagnosis not present

## 2020-11-13 LAB — BASIC METABOLIC PANEL
Anion gap: 8 (ref 5–15)
BUN: 29 mg/dL — ABNORMAL HIGH (ref 8–23)
CO2: 22 mmol/L (ref 22–32)
Calcium: 8.6 mg/dL — ABNORMAL LOW (ref 8.9–10.3)
Chloride: 105 mmol/L (ref 98–111)
Creatinine, Ser: 1.55 mg/dL — ABNORMAL HIGH (ref 0.44–1.00)
GFR, Estimated: 35 mL/min — ABNORMAL LOW (ref >60)
Glucose, Bld: 112 mg/dL — ABNORMAL HIGH (ref 70–99)
Potassium: 3.2 mmol/L — ABNORMAL LOW (ref 3.5–5.1)
Sodium: 135 mmol/L (ref 135–145)

## 2020-11-13 LAB — GLUCOSE, CAPILLARY
Glucose-Capillary: 99 mg/dL (ref 70–99)
Glucose-Capillary: 113 mg/dL — ABNORMAL HIGH (ref 70–99)
Glucose-Capillary: 127 mg/dL — ABNORMAL HIGH (ref 70–99)
Glucose-Capillary: 190 mg/dL — ABNORMAL HIGH (ref 70–99)

## 2020-11-13 MED ORDER — POTASSIUM CHLORIDE CRYS ER 20 MEQ PO TBCR
40.0000 meq | EXTENDED_RELEASE_TABLET | Freq: Once | ORAL | Status: DC
  Filled 2020-11-13: qty 2

## 2020-11-13 MED ORDER — PANTOPRAZOLE SODIUM 40 MG PO TBEC
40.0000 mg | DELAYED_RELEASE_TABLET | Freq: Every day | ORAL | 1 refills | Status: AC
  Filled 2020-11-13: qty 30, 30d supply, fill #0

## 2020-11-13 MED ORDER — APIXABAN 5 MG PO TABS
ORAL_TABLET | ORAL | 0 refills | Status: DC
  Filled 2020-11-13: qty 70, 30d supply, fill #0

## 2020-11-13 NOTE — TOC Initial Note (Signed)
Transition of Care [facility]) - Initial/Assessment Note    Patient Details  Name: Sandra Beasley MRN: [id number] Date of Birth: 1950-05-19  [facility] CM/SW Contact:    Joanne Chars, LCSW Phone Number: 11/13/2020, 11:08 AM  Clinical Narrative:    CSW met with pt regarding DC needs: home O2, eliquis.  Pt son and daughter present with pt, permission given to speak with them present.  Pt does not have current home O2, no agency preference, agreeable to [facility].  Also discussed TOC pharmacy will provide 30 day supply of eliquis, will follow up with PCP after this.  No other needs identified.  Children will transport home.                 Expected Discharge Plan: Home/Self Care Barriers to Discharge: No Barriers Identified   Patient Goals and CMS Choice Patient states their goals for this hospitalization and ongoing recovery are:: "back to normal" CMS Medicare.gov Compare Post Acute Care list provided to::  (na)    Expected Discharge Plan and Services Expected Discharge Plan: Home/Self Care     Post Acute Care Choice: Durable Medical Equipment Living arrangements for the past 2 months: Single Family Home Expected Discharge Date: 11/13/20               DME Arranged: Oxygen DME Agency: [facility] Date DME Agency Contacted: 11/13/20 Time DME Agency Contacted: 35 Representative spoke with at DME Agency: Freda Munro HH Arranged: NA          Prior Living Arrangements/Services Living arrangements for the past 2 months: [place] with:: Self Patient language and need for interpreter reviewed:: Yes Do you feel safe going back to the place where you live?: Yes      Need for Family Participation in Patient Care: No (Comment) Care giver support system in place?: Yes (comment) Current home services: Other (comment) (na) Criminal Activity/Legal Involvement Pertinent to Current Situation/Hospitalization: No - Comment as needed  Activities of Daily Living       Permission Sought/Granted Permission sought to share information with : Family Supports Permission granted to share information with : Yes, Verbal Permission Granted  Share Information with NAME: son Gilmer Mor, daughter (did not get name)  Permission granted to share info w AGENCY: [facility]        Emotional Assessment Appearance:: Appears stated age Attitude/Demeanor/Rapport: Engaged Affect (typically observed): Appropriate, Pleasant Orientation: : Oriented to Self, Oriented to Place, Oriented to  Time, Oriented to Situation Alcohol / Substance Use: Not Applicable Psych Involvement: No (comment)  Admission diagnosis:  PE (pulmonary thromboembolism) ([place]) [I26.99] Acute pulmonary embolism ([place]) [I26.99] Acute pulmonary embolism with acute cor pulmonale, unspecified pulmonary embolism type ([place]) [I26.09] Patient Active Problem List   Diagnosis Date Noted   PE (pulmonary thromboembolism) ([place]) 11/10/2020   Acute respiratory failure with hypoxia ([place])    Acute pulmonary embolism ([place]) 11/09/2020   Exercise-induced leg fatigue 04/15/2020   Ankle swelling 08/09/2014   Swelling of both lower extremities 08/09/2014   GERD (gastroesophageal reflux disease) 03/06/2013   Hyperlipidemia associated with type 2 diabetes mellitus ([place]) 02/24/2013   Severe obesity (BMI >= 40) ([place]) 02/19/2013   ST segment elevation myocardial infarction (STEMI) of inferolateral wall, subsequent episode of care ([place]) 01/02/2013   CAD S/P percutaneous coronary angioplasty - DES x2 in RCA 01/02/2013   Essential hypertension    Obesity    Type 2 diabetes mellitus with other circulatory complications ([place])    10 cm Lapband July  2006 12/30/2011   PCP:  Lawerance Cruel, MD Pharmacy:   [facility] - [place], [place] - [address] [place] [address] Phone: [phone] Fax: [phone]  [facility] [address] [place] [address] Phone: [phone] Fax: [phone]     Social Determinants of Health (SDOH) Interventions    Readmission Risk Interventions No flowsheet data found.

## 2020-11-13 NOTE — Progress Notes (Signed)
Nursing Discharge Note   Admit Date: 11/09/2020    Discharge date: 11/13/2020   Sandra Beasley is to be discharged home per MD order.  AVS completed. Reviewed with patient and family at bedside. Highlighted copy provided for patient to take home.  Patient/caregiver able to verbalize understanding of discharge instructions. PIV removed. Patient stable upon discharge.   Patient sent home with Roy prescriptions of Eliquis and Protonix (pantoprazole) that were delivered to unit.     Discharge Instructions      Information on my medicine - ELIQUIS (apixaban)  This medication education was reviewed with me or my healthcare representative as part of my discharge preparation. Why was Eliquis prescribed for you? Eliquis was prescribed to treat blood clots that may have been found in the veins of your legs (deep vein thrombosis) or in your lungs (pulmonary embolism) and to reduce the risk of them occurring again.  What do You need to know about Eliquis ? The starting dose is 10 mg (two 5 mg tablets) taken TWICE daily for the FIRST SEVEN (7) DAYS, then on 11/19/2020  the dose is reduced to ONE 5 mg tablet taken TWICE daily.  Eliquis may be taken with or without food.   Try to take the dose about the same time in the morning and in the evening. If you have difficulty swallowing the tablet whole please discuss with your pharmacist how to take the medication safely.  Take Eliquis exactly as prescribed and DO NOT stop taking Eliquis without talking to the doctor who prescribed the medication.  Stopping may increase your risk of developing a new blood clot.  Refill your prescription before you run out.  After discharge, you should have regular check-up appointments with your healthcare provider that is prescribing your Eliquis.    What do you do if you miss a dose? If a dose of ELIQUIS is not taken at the scheduled time, take it as soon as possible on the same day and twice-daily  administration should be resumed. The dose should not be doubled to make up for a missed dose.  Important Safety Information A possible side effect of Eliquis is bleeding. You should call your healthcare provider right away if you experience any of the following: Bleeding from an injury or your nose that does not stop. Unusual colored urine (red or dark brown) or unusual colored stools (red or black). Unusual bruising for unknown reasons. A serious fall or if you hit your head (even if there is no bleeding).  Some medicines may interact with Eliquis and might increase your risk of bleeding or clotting while on Eliquis. To help avoid this, consult your healthcare provider or pharmacist prior to using any new prescription or non-prescription medications, including herbals, vitamins, non-steroidal anti-inflammatory drugs (NSAIDs) and supplements.  This website has more information on Eliquis (apixaban): http://www.eliquis.com/eliquis/home     Allergies as of 11/13/2020       Reactions   Ace Inhibitors Cough   Other Cough   Spices make pt cough, sneeze, eyes water   Statins    Can take Crestor        Medication List     TAKE these medications    aspirin EC 81 MG tablet Take 1 tablet (81 mg total) by mouth daily.   candesartan 16 MG tablet Commonly known as: ATACAND Take 16 mg by mouth daily.   carvedilol 6.25 MG tablet Commonly known as: COREG Take 1 tablet (6.25 mg total) by mouth 2 (two)  times daily with a meal.   Cholecalciferol 25 MCG (1000 UT) Tbdp Take 1,000 Units by mouth every morning.   CO Q 10 PO Take 1 capsule by mouth daily.   Eliquis 5 MG Tabs tablet Generic drug: apixaban Please continue with 2 tablets (10 mg) by mouth twice daily. On 11/19/2020 transition to 1 tablet (5 mg) twice daily.   glimepiride 4 MG tablet Commonly known as: AMARYL Take 4 mg by mouth daily before breakfast.   metFORMIN 500 MG tablet Commonly known as: GLUCOPHAGE Take  1,000 mg by mouth 2 (two) times daily with a meal.   metroNIDAZOLE 0.75 % cream Commonly known as: METROCREAM Apply 1 application topically 2 (two) times daily as needed (rosacea). Apply to face   nitroGLYCERIN 0.4 MG SL tablet Commonly known as: Nitrostat Place 1 tablet (0.4 mg total) under the tongue every 5 (five) minutes as needed for chest pain.   OMEGA 3 PO Take 1 capsule by mouth daily.   pantoprazole 40 MG tablet Commonly known as: Protonix Take 1 tablet (40 mg total) by mouth daily. What changed: when to take this   rosuvastatin 10 MG tablet Commonly known as: CRESTOR Take 10 mg by mouth daily.   triamcinolone cream 0.1 % Commonly known as: KENALOG Apply 1 application topically 2 (two) times daily as needed (rash/irritation).               Durable Medical Equipment  (From admission, onward)           Start     Ordered   11/13/20 1018  For home use only DME oxygen  Once       Question Answer Comment  Length of Need 6 Months   Mode or (Route) Nasal cannula   Liters per Minute 2   Frequency Continuous (stationary and portable oxygen unit needed)   Oxygen conserving device Yes   Oxygen delivery system Gas      11/13/20 1017            Discharge Instructions/ Education: Discharge instructions given to patient/family with verbalized understanding. Discharge education completed with patient/family including: follow up instructions, medication list, discharge activities, and limitations if indicated. Additional discharge instructions as indicated by discharging provider also reviewed. Patient and family able to verbalize understanding, all questions fully answered. Patient instructed to return to Emergency Department, call 911, or call MD for any changes in condition.  Patient escorted via wheelchair to lobby and discharged home via private automobile.

## 2020-11-13 NOTE — Care Management Important Message (Signed)
Important Message  Patient Details  Name: Sandra Beasley MRN: [id number] Date of Birth: 1950/07/31   Medicare Important Message Given:  Yes     Leandro Berkowitz 11/13/2020, 3:02 PM

## 2020-11-13 NOTE — Discharge Summary (Signed)
Physician Discharge Summary  Sandra Beasley EXB:[id number] DOB: 05/18/1950 DOA: 11/09/2020  PCP: Lawerance Cruel, MD  Admit date: 11/09/2020 Discharge date: 11/13/2020  Admitted From: Home Disposition:  Home   Recommendations for Outpatient Follow-up:  Follow up with PCP in 1-2 weeks Please obtain BMP/CBC in one week   Home Health:NO Equipment/Devices:oxygen 2 L  Discharge Condition:Stable CODE STATUS:FULL Diet recommendation: Heart Healthy / Carb Modified   Brief/Interim Summary:  70 year old woman with history of hypertension, diabetes mellitus type 2, CAD, hyperlipidemia presented with progressive dyspnea.  Found to be in acute hypoxemic respiratory failure and bilateral pulmonary emboli.  She recently traveled to [place] by plane .  No history of previous blood clots.  Echocardiogram showed severe right heart strain, lower extremity Doppler showed bilateral DVTs.  She was initially in ICU, started on IV heparin drip.  Hemodynamically remained stable, did not require tPA or IR thrombectomy.  PCCM signed off and recommended 6 months of anticoagulation.  Patient was transferred to medical floor transition to Eliquis on 11/12/2020.  Acute hypoxemic respiratory failure/cor pulmonale -Secondary to acute pulmonary emboli and bilateral DVTs -Hemodynamically she is stable -She will require 3 to 6 months of anticoagulation since it was a provoked DVT/PE -Started on apixaban -Did not require IR thrombectomy or tPA -Echocardiogram did show right heart strain -She denies any dyspnea, she is requiring 2 L nasal cannula with activity.   Hypertension -Continue Coreg, losartan   Hyperlipidemia -Continue rosuvastatin   Diabetes mellitus type 2 -Hemoglobin A1c 7.0 -Resume home medication including metformin and Amaryl, metformin held during hospital stay given she received IV contrast,   CKD stage IIIb -Creatinine at baseline  Discharge Diagnoses:  Active Problems:   Acute pulmonary  embolism (HCC)   PE (pulmonary thromboembolism) ([place])   Acute respiratory failure with hypoxia [facility])    Discharge Instructions  Discharge Instructions     Diet - low sodium heart healthy   Complete by: As directed    Increase activity slowly   Complete by: As directed       Allergies as of 11/13/2020       Reactions   Ace Inhibitors Cough   Other Cough   Spices make pt cough, sneeze, eyes water   Statins    Can take Crestor        Medication List     TAKE these medications    apixaban 5 MG Tabs tablet Commonly known as: ELIQUIS Please continue with 10 mg oral twice daily, and transition to 5 mg oral twice daily on 11/19/2020   aspirin EC 81 MG tablet Take 1 tablet (81 mg total) by mouth daily.   candesartan 16 MG tablet Commonly known as: ATACAND Take 16 mg by mouth daily.   carvedilol 6.25 MG tablet Commonly known as: COREG Take 1 tablet (6.25 mg total) by mouth 2 (two) times daily with a meal.   Cholecalciferol 25 MCG (1000 UT) Tbdp Take 1,000 Units by mouth every morning.   CO Q 10 PO Take 1 capsule by mouth daily.   glimepiride 4 MG tablet Commonly known as: AMARYL Take 4 mg by mouth daily before breakfast.   metFORMIN 500 MG tablet Commonly known as: GLUCOPHAGE Take 1,000 mg by mouth 2 (two) times daily with a meal.   metroNIDAZOLE 0.75 % cream Commonly known as: METROCREAM Apply 1 application topically 2 (two) times daily as needed (rosacea). Apply to face   nitroGLYCERIN 0.4 MG SL tablet Commonly known as: Nitrostat Place 1 tablet (  0.4 mg total) under the tongue every 5 (five) minutes as needed for chest pain.   OMEGA 3 PO Take 1 capsule by mouth daily.   pantoprazole 40 MG tablet Commonly known as: Protonix Take 1 tablet (40 mg total) by mouth daily. What changed: when to take this   rosuvastatin 10 MG tablet Commonly known as: CRESTOR Take 10 mg by mouth daily.   triamcinolone cream 0.1 % Commonly known as: KENALOG Apply 1  application topically 2 (two) times daily as needed (rash/irritation).               Durable Medical Equipment  (From admission, onward)           Start     Ordered   11/13/20 1018  For home use only DME oxygen  Once       Question Answer Comment  Length of Need 6 Months   Mode or (Route) Nasal cannula   Liters per Minute 2   Frequency Continuous (stationary and portable oxygen unit needed)   Oxygen conserving device Yes   Oxygen delivery system Gas      11/13/20 1017            Follow-up Information     Freddi Starr, MD Follow up on 12/18/2020.   Specialty: Pulmonary Disease Why: Appointment time 10:30am Contact information: [address] [place] [address] [phone]         Lawerance Cruel, MD Follow up in 2 week(s).   Specialty: Family Medicine Contact information: [address] [place] [address] [phone]                Allergies  Allergen Reactions   Ace Inhibitors Cough   Other Cough    Spices make pt cough, sneeze, eyes water   Statins     Can take Crestor    Consultations: PCCM   Procedures/Studies: DG Chest 2 View  Result Date: 11/09/2020 CLINICAL DATA:  Shortness of breath EXAM: CHEST - 2 VIEW COMPARISON:  02/04/2018 FINDINGS: Chronic cardiomegaly. Mild tortuosity of the aorta. There is pulmonary venous hypertension without frank edema. No infiltrate, collapse or effusion. Ordinary mild degenerative changes affect the spine. IMPRESSION: Cardiomegaly.  Pulmonary venous hypertension without frank edema. Electronically Signed   By: Nelson Chimes M.D.   On: 11/09/2020 14:37   CT Angio Chest PE W/Cm &/Or Wo Cm  Result Date: 11/09/2020 CLINICAL DATA:  Shortness of breath. EXAM: CT ANGIOGRAPHY CHEST WITH CONTRAST TECHNIQUE: Multidetector CT imaging of the chest was performed using the standard protocol during bolus administration of intravenous contrast. Multiplanar CT image  reconstructions and MIPs were obtained to evaluate the vascular anatomy. CONTRAST:  49mL OMNIPAQUE IOHEXOL 350 MG/ML SOLN COMPARISON:  None. FINDINGS: Cardiovascular: An extensive amount of intraluminal filling defects are seen within the bilateral pulmonary arteries and numerous bilateral upper lobe, right middle lobe and bilateral lower lobe branches. No saddle embolus is identified. Mild cardiomegaly is seen with associated right heart strain. No pericardial effusion. Mediastinum/Nodes: No enlarged mediastinal, hilar, or axillary lymph nodes. Thyroid gland, trachea, and esophagus demonstrate no significant findings. Lungs/Pleura: Lungs are clear. No pleural effusion or pneumothorax. Upper Abdomen: There is a moderate-sized hiatal hernia. Evidence of prior gastric banding surgery is seen. Numerous subcentimeter gallstones are seen within the lumen of an otherwise normal-appearing gallbladder. Musculoskeletal: No chest wall abnormality. No acute or significant osseous findings. Review of the MIP images confirms the above findings. IMPRESSION: 1. Extensive bilateral pulmonary  emboli with associated right heart strain. 2. Moderate-sized hiatal hernia. 3. Evidence of prior gastric banding surgery. 4. Cholelithiasis. Electronically Signed   By: Virgina Norfolk M.D.   On: 11/09/2020 21:17   US RENAL  Result Date: 11/10/2020 CLINICAL DATA:  AK I EXAM: RENAL / URINARY TRACT ULTRASOUND COMPLETE COMPARISON:  None. FINDINGS: Right Kidney: Renal measurements: 11.6 cm x 5.0 cm x 4.8 cm = volume: 146 mL. The right kidney cortex is thinned. There is a 1.4 cm upper pole cyst. There are no other focal lesions. There is no hydronephrosis. Left Kidney: Renal measurements: 12.7 cm x 5.6 cm x 6.2 cm = volume: 229 mL. The left kidney cortex is thinned. There is a 1.9 cm parapelvic cyst. There are no other focal lesions. There is no hydronephrosis. Bladder: Appears normal for degree of bladder distention. Other: None. IMPRESSION:  Thinning of the renal cortex bilaterally and small bilateral cysts. No hydronephrosis. Electronically Signed   By: Valetta Mole M.D.   On: 11/10/2020 14:54   ECHOCARDIOGRAM COMPLETE  Result Date: 11/10/2020    ECHOCARDIOGRAM REPORT   Patient Name:   Sandra Beasley Date of Exam: 11/10/2020 Medical Rec #:  [id number]       Height:       62.0 in Accession #:    [id number]      Weight:       227.3 lb Date of Birth:  [birth year]        BSA:          2.019 m Patient Age:    58 years        BP:           169/101 mmHg Patient Gender: F               HR:           71 bpm. Exam Location:  Inpatient Procedure: 2D Echo, Cardiac Doppler and Color Doppler Indications:    Pulmonary embolus I26.09  History:        Patient has no prior history of Echocardiogram examinations.                 CAD; Risk Factors:Dyslipidemia, Hypertension and Diabetes.  Sonographer:    Merrie Roof RDCS Referring Phys: [id number] [place]  1. McConnell's sign noted suggestive of pulmonary embolus.  2. Left ventricular ejection fraction, by estimation, is 55 to 60%. The left ventricle has normal function. The left ventricle has no regional wall motion abnormalities. There is severe left ventricular hypertrophy. Left ventricular diastolic parameters  are consistent with Grade I diastolic dysfunction (impaired relaxation). There is the interventricular septum is flattened in systole and diastole, consistent with right ventricular pressure and volume overload.  3. Right ventricular systolic function is severely reduced. The right ventricular size is mildly enlarged. There is moderately elevated pulmonary artery systolic pressure.  4. Left atrial size was mildly dilated.  5. A small pericardial effusion is present.  6. The mitral valve is normal in structure. Trivial mitral valve regurgitation. No evidence of mitral stenosis.  7. The aortic valve has an indeterminant number of cusps. Aortic valve regurgitation is not visualized. No aortic  stenosis is present.  8. The inferior vena cava is dilated in size with >50% respiratory variability, suggesting right atrial pressure of 8 mmHg. Comparison(s): No prior Echocardiogram. FINDINGS  Left Ventricle: Left ventricular ejection fraction, by estimation, is 55 to 60%. The left ventricle has normal function. The left ventricle has no regional  wall motion abnormalities. The left ventricular internal cavity size was normal in size. There is  severe left ventricular hypertrophy. The interventricular septum is flattened in systole and diastole, consistent with right ventricular pressure and volume overload. Left ventricular diastolic parameters are consistent with Grade I diastolic dysfunction (impaired relaxation). Right Ventricle: The right ventricular size is mildly enlarged. Right ventricular systolic function is severely reduced. There is moderately elevated pulmonary artery systolic pressure. The tricuspid regurgitant velocity is 3.49 m/s, and with an assumed right atrial pressure of 8 mmHg, the estimated right ventricular systolic pressure is 61.6 mmHg. Left Atrium: Left atrial size was mildly dilated. Right Atrium: Right atrial size was normal in size. Pericardium: A small pericardial effusion is present. Mitral Valve: The mitral valve is normal in structure. Trivial mitral valve regurgitation. No evidence of mitral valve stenosis. Tricuspid Valve: The tricuspid valve is normal in structure. Tricuspid valve regurgitation is mild . No evidence of tricuspid stenosis. Aortic Valve: The aortic valve has an indeterminant number of cusps. Aortic valve regurgitation is not visualized. No aortic stenosis is present. Aortic valve mean gradient measures 3.0 mmHg. Aortic valve peak gradient measures 7.2 mmHg. Pulmonic Valve: The pulmonic valve was normal in structure. Pulmonic valve regurgitation is trivial. No evidence of pulmonic stenosis. Aorta: The aortic root is normal in size and structure. Venous: The  inferior vena cava is dilated in size with greater than 50% respiratory variability, suggesting right atrial pressure of 8 mmHg. IAS/Shunts: There is left bowing of the interatrial septum, suggestive of elevated right atrial pressure. No atrial level shunt detected by color flow Doppler. Additional Comments: McConnell's sign noted suggestive of pulmonary embolus.  LEFT VENTRICLE PLAX 2D LVIDd:         4.30 cm  Diastology LVIDs:         3.40 cm  LV e' medial:    6.64 cm/s LV PW:         1.50 cm  LV E/e' medial:  4.5 LV IVS:        1.60 cm  LV e' lateral:   4.68 cm/s LVOT diam:     2.00 cm  LV E/e' lateral: 6.4 LV SV:         40 LV SV Index:   20 LVOT Area:     3.14 cm  RIGHT VENTRICLE             IVC RV Basal diam:  3.60 cm     IVC diam: 2.20 cm RV S prime:     14.70 cm/s TAPSE (M-mode): 1.9 cm LEFT ATRIUM             Index       RIGHT ATRIUM           Index LA diam:        3.90 cm 1.93 cm/m  RA Area:     21.10 cm LA Vol (A2C):   53.7 ml 26.60 ml/m RA Volume:   49.50 ml  24.52 ml/m LA Vol (A4C):   96.8 ml 47.96 ml/m LA Biplane Vol: 75.8 ml 37.55 ml/m  AORTIC VALVE AV Area (Vmax):    1.59 cm AV Area (Vmean):   1.94 cm AV Area (VTI):     1.71 cm AV Vmax:           134.00 cm/s AV Vmean:          86.000 cm/s AV VTI:            0.235 m AV  Peak Grad:      7.2 mmHg AV Mean Grad:      3.0 mmHg LVOT Vmax:         67.90 cm/s LVOT Vmean:        53.000 cm/s LVOT VTI:          0.128 m LVOT/AV VTI ratio: 0.54  AORTA Ao Root diam: 3.50 cm MITRAL VALVE               TRICUSPID VALVE MV Area (PHT): 3.66 cm    TR Peak grad:   48.7 mmHg MV Decel Time: 207 msec    TR Vmax:        349.00 cm/s MV E velocity: 30.00 cm/s MV A velocity: 85.30 cm/s  SHUNTS MV E/A ratio:  0.35        Systemic VTI:  0.13 m                            Systemic Diam: 2.00 cm Kirk Ruths MD Electronically signed by Kirk Ruths MD Signature Date/Time: 11/10/2020/4:12:34 PM    Final    VAS [place] LOWER EXTREMITY VENOUS (DVT)  Result Date: 11/10/2020   Lower Venous DVT Study Patient Name:  Sandra Beasley  Date of Exam:   11/10/2020 Medical Rec #: [id number]        Accession #:    [id number] Date of Birth: 08-17-1950         Patient Gender: F Patient Age:   65 years Exam Location:  [facility] Procedure:      VAS [place] LOWER EXTREMITY VENOUS (DVT) Referring Phys: Marshell Garfinkel --------------------------------------------------------------------------------  Indications: Pulmonary embolism.  Anticoagulation: Heparin. Comparison Study: No prior studies Performing Technologist: Darlin Coco RDMS, RVT  Examination Guidelines: A complete evaluation includes B-mode imaging, spectral Doppler, color Doppler, and power Doppler as needed of all accessible portions of each vessel. Bilateral testing is considered an integral part of a complete examination. Limited examinations for reoccurring indications may be performed as noted. The reflux portion of the exam is performed with the patient in reverse Trendelenburg.  +---------+---------------+---------+-----------+----------+--------------+ RIGHT    CompressibilityPhasicitySpontaneityPropertiesThrombus Aging +---------+---------------+---------+-----------+----------+--------------+ CFV      Full           No       Yes                                 +---------+---------------+---------+-----------+----------+--------------+ SFJ      Full                                                        +---------+---------------+---------+-----------+----------+--------------+ FV Prox  Full                                                        +---------+---------------+---------+-----------+----------+--------------+ FV Mid   Full                                                        +---------+---------------+---------+-----------+----------+--------------+  FV DistalFull                                                         +---------+---------------+---------+-----------+----------+--------------+ PFV      Full                                                        +---------+---------------+---------+-----------+----------+--------------+ POP      Full           No       Yes                                 +---------+---------------+---------+-----------+----------+--------------+ PTV      None                                                        +---------+---------------+---------+-----------+----------+--------------+ PERO     Full                                                        +---------+---------------+---------+-----------+----------+--------------+   +---------+---------------+---------+-----------+----------+--------------+ LEFT     CompressibilityPhasicitySpontaneityPropertiesThrombus Aging +---------+---------------+---------+-----------+----------+--------------+ CFV      Full           No       Yes                                 +---------+---------------+---------+-----------+----------+--------------+ SFJ      Full                                                        +---------+---------------+---------+-----------+----------+--------------+ FV Prox  Full                                                        +---------+---------------+---------+-----------+----------+--------------+ FV Mid   Full                                                        +---------+---------------+---------+-----------+----------+--------------+ FV DistalFull                                                        +---------+---------------+---------+-----------+----------+--------------+  PFV      Full                                                        +---------+---------------+---------+-----------+----------+--------------+ POP      Full           No       Yes                                  +---------+---------------+---------+-----------+----------+--------------+ PTV      None                                                        +---------+---------------+---------+-----------+----------+--------------+ PERO     None                                                        +---------+---------------+---------+-----------+----------+--------------+     Summary: RIGHT: - Findings consistent with acute deep vein thrombosis involving the right posterior tibial veins. - No cystic structure found in the popliteal fossa.  LEFT: - Findings consistent with acute deep vein thrombosis involving the left posterior tibial veins, and left peroneal veins. - No cystic structure found in the popliteal fossa.  Pulsatile lower extremity venous flow is suggestive of possibly elevated right heart pressure.  *See table(s) above for measurements and observations. Electronically signed by Deitra Mayo MD on 11/10/2020 at 5:30:20 PM.    Final       Subjective:  She denies any complaints today, reports she tolerated ambulation in the hallway yesterday, no dyspnea, no chest pain.  Discharge Exam: Vitals:   11/13/20 0005 11/13/20 0400  BP: (!) 147/79   Pulse: 67 68  Resp: 18 18  Temp: 97.8 F (36.6 C)   SpO2: 96% 100%   Vitals:   11/12/20 1638 11/12/20 2050 11/13/20 0005 11/13/20 0400  BP: 113/64 (!) 130/103 (!) 147/79   Pulse: 70 72 67 68  Resp: (!) 22 20 18 18   Temp:  98.9 F (37.2 C) 97.8 F (36.6 C)   TempSrc:  Oral Oral   SpO2: 99% 98% 96% 100%  Weight:      Height:        General: Pt is alert, awake, not in acute distress Cardiovascular: RRR, S1/S2 +, no rubs, no gallops Respiratory: CTA bilaterally, no wheezing, no rhonchi Abdominal: Soft, NT, ND, bowel sounds + Extremities: no edema, no cyanosis    The results of significant diagnostics from this hospitalization (including imaging, microbiology, ancillary and laboratory) are listed below for reference.      Microbiology: Recent Results (from the past 240 hour(s))  Resp Panel by RT-PCR (Flu A&B, Covid) Nasopharyngeal Swab     Status: None   Collection Time: 11/09/20  5:51 PM   Specimen: Nasopharyngeal Swab; Nasopharyngeal(NP) swabs in vial transport medium  Result Value Ref Range Status   SARS Coronavirus 2 by RT PCR NEGATIVE NEGATIVE Final  Comment: (NOTE) SARS-CoV-2 target nucleic acids are NOT DETECTED.  The SARS-CoV-2 RNA is generally detectable in upper respiratory specimens during the acute phase of infection. The lowest concentration of SARS-CoV-2 viral copies this assay can detect is 138 copies/mL. A negative result does not preclude SARS-Cov-2 infection and should not be used as the sole basis for treatment or other patient management decisions. A negative result may occur with  improper specimen collection/handling, submission of specimen other than nasopharyngeal swab, presence of viral mutation(s) within the areas targeted by this assay, and inadequate number of viral copies(<138 copies/mL). A negative result must be combined with clinical observations, patient history, and epidemiological information. The expected result is Negative.  Fact Sheet for Patients:  EntrepreneurPulse.com.au  Fact Sheet for Healthcare Providers:  IncredibleEmployment.be  This test is no t yet approved or cleared by the [place] FDA and  has been authorized for detection and/or diagnosis of SARS-CoV-2 by FDA under an Emergency Use Authorization (EUA). This EUA will remain  in effect (meaning this test can be used) for the duration of the COVID-19 declaration under Section 564(b)(1) of the Act, 21 U.S.C.section 360bbb-3(b)(1), unless the authorization is terminated  or revoked sooner.       Influenza A by PCR NEGATIVE NEGATIVE Final   Influenza B by PCR NEGATIVE NEGATIVE Final    Comment: (NOTE) The Xpert Xpress SARS-CoV-2/FLU/RSV plus assay is  intended as an aid in the diagnosis of influenza from Nasopharyngeal swab specimens and should not be used as a sole basis for treatment. Nasal washings and aspirates are unacceptable for Xpert Xpress SARS-CoV-2/FLU/RSV testing.  Fact Sheet for Patients: EntrepreneurPulse.com.au  Fact Sheet for Healthcare Providers: IncredibleEmployment.be  This test is not yet approved or cleared by the [place] FDA and has been authorized for detection and/or diagnosis of SARS-CoV-2 by FDA under an Emergency Use Authorization (EUA). This EUA will remain in effect (meaning this test can be used) for the duration of the COVID-19 declaration under Section 564(b)(1) of the Act, 21 U.S.C. section 360bbb-3(b)(1), unless the authorization is terminated or revoked.  Performed at [facility], [address] [place], [place] [address]   MRSA Next Gen by PCR, Nasal     Status: None   Collection Time: 11/10/20  3:22 AM   Specimen: Nasal Mucosa; Nasal Swab  Result Value Ref Range Status   MRSA by PCR Next Gen NOT DETECTED NOT DETECTED Final    Comment: (NOTE) The GeneXpert MRSA Assay (FDA approved for NASAL specimens only), is one component of a comprehensive MRSA colonization surveillance program. It is not intended to diagnose MRSA infection nor to guide or monitor treatment for MRSA infections. Test performance is not FDA approved in patients less than 71 years old. Performed at [facility], [place] [address] [place], [place] [address]      Labs: BNP (last 3 results) Recent Labs    11/09/20 1642  BNP 9,741.6*   Basic Metabolic Panel: Recent Labs  Lab 11/09/20 1642 11/10/20 0516 11/11/20 0217 11/13/20 0221  NA 139 138 135 135  K 3.5 3.0* 3.6 3.2*  CL 106 103 104 105  CO2 20* 22 22 22   GLUCOSE 240* 221* 195* 112*  BUN 25* 20 25* 29*  CREATININE 1.45* 1.37* 1.43* 1.55*  CALCIUM 8.8* 8.9 8.6* 8.6*   Liver Function  Tests: No results for input(s): AST, ALT, ALKPHOS, BILITOT, PROT, ALBUMIN in the last 168 hours. No results for input(s): LIPASE, AMYLASE in the last 168  hours. No results for input(s): AMMONIA in the last 168 hours. CBC: Recent Labs  Lab 11/09/20 1642 11/10/20 0516 11/11/20 0217  WBC 7.6 9.0 10.9*  NEUTROABS 5.5  --   --   HGB 10.3* 10.2* 10.2*  HCT 34.9* 34.0* 34.8*  MCV 74.3* 74.1* 74.4*  PLT 218 210 213   Cardiac Enzymes: No results for input(s): CKTOTAL, CKMB, CKMBINDEX, TROPONINI in the last 168 hours. BNP: Invalid input(s): POCBNP CBG: Recent Labs  Lab 11/12/20 1639 11/12/20 2050 11/13/20 0003 11/13/20 0430 11/13/20 0842  GLUCAP 152* 199* 127* 99 113*   D-Dimer No results for input(s): DDIMER in the last 72 hours. Hgb A1c No results for input(s): HGBA1C in the last 72 hours. Lipid Profile No results for input(s): CHOL, HDL, LDLCALC, TRIG, CHOLHDL, LDLDIRECT in the last 72 hours. Thyroid function studies No results for input(s): TSH, T4TOTAL, T3FREE, THYROIDAB in the last 72 hours.  Invalid input(s): FREET3 Anemia work up No results for input(s): VITAMINB12, FOLATE, FERRITIN, TIBC, IRON, RETICCTPCT in the last 72 hours. Urinalysis No results found for: COLORURINE, APPEARANCEUR, [place], [place], GLUCOSEU, [place], [place], [place], PROTEINUR, UROBILINOGEN, NITRITE, LEUKOCYTESUR Sepsis Labs Invalid input(s): PROCALCITONIN,  WBC,  LACTICIDVEN Microbiology Recent Results (from the past 240 hour(s))  Resp Panel by RT-PCR (Flu A&B, Covid) Nasopharyngeal Swab     Status: None   Collection Time: 11/09/20  5:51 PM   Specimen: Nasopharyngeal Swab; Nasopharyngeal(NP) swabs in vial transport medium  Result Value Ref Range Status   SARS Coronavirus 2 by RT PCR NEGATIVE NEGATIVE Final    Comment: (NOTE) SARS-CoV-2 target nucleic acids are NOT DETECTED.  The SARS-CoV-2 RNA is generally detectable in upper respiratory specimens during the acute phase of infection.  The lowest concentration of SARS-CoV-2 viral copies this assay can detect is 138 copies/mL. A negative result does not preclude SARS-Cov-2 infection and should not be used as the sole basis for treatment or other patient management decisions. A negative result may occur with  improper specimen collection/handling, submission of specimen other than nasopharyngeal swab, presence of viral mutation(s) within the areas targeted by this assay, and inadequate number of viral copies(<138 copies/mL). A negative result must be combined with clinical observations, patient history, and epidemiological information. The expected result is Negative.  Fact Sheet for Patients:  EntrepreneurPulse.com.au  Fact Sheet for Healthcare Providers:  IncredibleEmployment.be  This test is no t yet approved or cleared by the [place] FDA and  has been authorized for detection and/or diagnosis of SARS-CoV-2 by FDA under an Emergency Use Authorization (EUA). This EUA will remain  in effect (meaning this test can be used) for the duration of the COVID-19 declaration under Section 564(b)(1) of the Act, 21 U.S.C.section 360bbb-3(b)(1), unless the authorization is terminated  or revoked sooner.       Influenza A by PCR NEGATIVE NEGATIVE Final   Influenza B by PCR NEGATIVE NEGATIVE Final    Comment: (NOTE) The Xpert Xpress SARS-CoV-2/FLU/RSV plus assay is intended as an aid in the diagnosis of influenza from Nasopharyngeal swab specimens and should not be used as a sole basis for treatment. Nasal washings and aspirates are unacceptable for Xpert Xpress SARS-CoV-2/FLU/RSV testing.  Fact Sheet for Patients: EntrepreneurPulse.com.au  Fact Sheet for Healthcare Providers: IncredibleEmployment.be  This test is not yet approved or cleared by the [place] FDA and has been authorized for detection and/or diagnosis of SARS-CoV-2 by FDA  under an Emergency Use Authorization (EUA). This EUA will remain in effect (meaning this test can be  used) for the duration of the COVID-19 declaration under Section 564(b)(1) of the Act, 21 U.S.C. section 360bbb-3(b)(1), unless the authorization is terminated or revoked.  Performed at [facility], [address], [place], [place] [address]   MRSA Next Gen by PCR, Nasal     Status: None   Collection Time: 11/10/20  3:22 AM   Specimen: Nasal Mucosa; Nasal Swab  Result Value Ref Range Status   MRSA by PCR Next Gen NOT DETECTED NOT DETECTED Final    Comment: (NOTE) The GeneXpert MRSA Assay (FDA approved for NASAL specimens only), is one component of a comprehensive MRSA colonization surveillance program. It is not intended to diagnose MRSA infection nor to guide or monitor treatment for MRSA infections. Test performance is not FDA approved in patients less than 75 years old. Performed at [facility], [place] [address] [place], [place] [address]      Time coordinating discharge: Over 30 minutes  SIGNED:   Phillips Climes, MD  [facility] 11/13/2020, 10:29 AM Pager   If 7PM-7AM, please contact night-coverage [email or web address] Password [email or web address]

## 2020-11-16 ENCOUNTER — Ambulatory Visit: Payer: Medicare HMO | Admitting: Podiatry

## 2020-11-26 ENCOUNTER — Other Ambulatory Visit (HOSPITAL_COMMUNITY): Payer: Self-pay

## 2020-11-26 ENCOUNTER — Telehealth (HOSPITAL_COMMUNITY): Payer: Self-pay

## 2020-11-26 NOTE — Telephone Encounter (Signed)
Transitions of Care Pharmacy  ° °Call attempted for a pharmacy transitions of care follow-up. HIPAA appropriate voicemail was left with call back information provided.  ° °Call attempt #1. Will follow-up in 2-3 days.  °  °

## 2020-11-27 ENCOUNTER — Telehealth (HOSPITAL_COMMUNITY): Payer: Self-pay

## 2020-11-27 DIAGNOSIS — J9601 Acute respiratory failure with hypoxia: Secondary | ICD-10-CM | POA: Diagnosis not present

## 2020-11-27 DIAGNOSIS — I2699 Other pulmonary embolism without acute cor pulmonale: Secondary | ICD-10-CM | POA: Diagnosis not present

## 2020-11-27 DIAGNOSIS — E1169 Type 2 diabetes mellitus with other specified complication: Secondary | ICD-10-CM | POA: Diagnosis not present

## 2020-11-27 DIAGNOSIS — Z09 Encounter for follow-up examination after completed treatment for conditions other than malignant neoplasm: Secondary | ICD-10-CM | POA: Diagnosis not present

## 2020-11-27 DIAGNOSIS — I10 Essential (primary) hypertension: Secondary | ICD-10-CM | POA: Diagnosis not present

## 2020-11-27 NOTE — Telephone Encounter (Signed)
Pharmacy Transitions of Care Follow-up Telephone Call  Date of discharge: 11/13/20  Discharge Diagnosis: PE  How have you been since you were released from the hospital?  [place] doing well, no questions about meds at this time.  Medication changes made at discharge:  - START: Eliquis 5mg   Medication changes verified by the patient? Yes    Medication Accessibility:  Home Pharmacy:  [facility]  Was the patient provided with refills on discharged medications? No refills on Eliquis, 1 refill on Pantoprazole   Have all prescriptions been transferred from [facility] to home pharmacy?  [facility] is mail order pharmacy, patient will have PCP send Rx to pharmacy at appointment today  Is the patient able to afford medications? Has insurance    Medication Review:  APIXABAN (ELIQUIS)  Apixaban 10 mg BID initiated on 11/13/20. Will switch to apixaban 5 mg BID after 7 days (DATE 11/19/20).  - Discussed importance of taking medication around the same time everyday  - Advised patient of medications to avoid (NSAIDs, ASA)  - Educated that Tylenol (acetaminophen) will be the preferred analgesic to prevent risk of bleeding  - Emphasized importance of monitoring for signs and symptoms of bleeding (abnormal bruising, prolonged bleeding, nose bleeds, bleeding from gums, discolored urine, black tarry stools)  - Advised patient to alert all providers of anticoagulation therapy prior to starting a new medication or having a procedure   Follow-up Appointments:  PCP Hospital f/u appt confirmed? Seeing PCP 11/27/20  Specialist Hospital f/u appt confirmed? Scheduled to see Dr.  Fulling on 12/18/20 @ 10:30am.   If their condition worsens, is the pt aware to call PCP or go to the Emergency Dept.? yes  Final Patient Assessment: Patient has follow up scheduled and knows to get refills at follow up

## 2020-12-14 DIAGNOSIS — E782 Mixed hyperlipidemia: Secondary | ICD-10-CM | POA: Diagnosis not present

## 2020-12-14 DIAGNOSIS — I252 Old myocardial infarction: Secondary | ICD-10-CM | POA: Diagnosis not present

## 2020-12-14 DIAGNOSIS — K219 Gastro-esophageal reflux disease without esophagitis: Secondary | ICD-10-CM | POA: Diagnosis not present

## 2020-12-14 DIAGNOSIS — E1169 Type 2 diabetes mellitus with other specified complication: Secondary | ICD-10-CM | POA: Diagnosis not present

## 2020-12-14 DIAGNOSIS — N183 Chronic kidney disease, stage 3 unspecified: Secondary | ICD-10-CM | POA: Diagnosis not present

## 2020-12-14 DIAGNOSIS — I10 Essential (primary) hypertension: Secondary | ICD-10-CM | POA: Diagnosis not present

## 2020-12-18 ENCOUNTER — Other Ambulatory Visit: Payer: Self-pay

## 2020-12-18 ENCOUNTER — Encounter: Payer: Self-pay | Admitting: Pulmonary Disease

## 2020-12-18 ENCOUNTER — Ambulatory Visit: Payer: Medicare HMO | Admitting: Pulmonary Disease

## 2020-12-18 VITALS — BP 142/80 | HR 62 | Ht 62.0 in | Wt 224.0 lb

## 2020-12-18 DIAGNOSIS — I2602 Saddle embolus of pulmonary artery with acute cor pulmonale: Secondary | ICD-10-CM | POA: Diagnosis not present

## 2020-12-18 DIAGNOSIS — I82443 Acute embolism and thrombosis of tibial vein, bilateral: Secondary | ICD-10-CM | POA: Diagnosis not present

## 2020-12-18 NOTE — Progress Notes (Signed)
Synopsis: Referred in November 2022 for hospital follow up of pulmonary emboli  Subjective:   PATIENT ID: Sandra Beasley GENDER: female DOB: 07/12/1950, MRN: [id number]  HPI  Chief Complaint  Patient presents with   Hospitalization Follow-up    HFU for PE. States she has gotten her strength and energy back but has been struggling with her BP.    Sandra Beasley is a 70 year old woman, never smoker with history of hypertension, DMII, CAD and hyperlipidemia who comes to pulmonary clinic for hospital follow up of pulmonary emboli and bilateral DVT.    She reports her activity level has returned to normal and her shortness of breath is significantly improved but does remain somewhat with exertion. She has been having issues with high blood pressure since discharge which her primary care team is following. She had a negative sleep study 10 years ago and denies snoring or apnea events.   She has returned to work. She denies any blood in her urine or stools. She does report 1 episode of coughing up blood tinged sputum two weeks ago.   Past Medical History:  Diagnosis Date   Adenomatous polyp of colon 09/2005   CAD S/P percutaneous coronary angioplasty - DES x2 in RCA 01/02/2013; 02/2013   a. 100% RPDA - Xience Xpedition DES 2.25 mm x 15 mm + 2.25 mm x 8 mm overlapping, mod- severe distal LAD and prox OM1 lesions.;b.  Myoview 02/2013 & Jan 2020: No ischemia or Infarction, Normal EF.   GERD (gastroesophageal reflux disease) 03/06/2013   Hyperlipidemia associated with type 2 diabetes mellitus ([place])    Hypertension    Obesity    STEMI (ST elevation myocardial infarction) ([facility]) 01/02/13   --> PCI; EF 60 and 65%. Gr 1 DD & No regional WMA, Otherwise relatively normal.   Type 2 diabetes mellitus with circulatory disorder ([place])    Vitamin D deficiency      Family History  Adopted: Yes  Problem Relation Age of Onset   Breast cancer Maternal Aunt    Diabetes Maternal Aunt    Heart disease Maternal  Aunt    Diabetes Maternal Uncle    Diabetes gravidarum Maternal Uncle    Colon cancer Neg Hx    Stomach cancer Neg Hx    Rectal cancer Neg Hx      Social History   Socioeconomic History   Marital status: Divorced    Spouse name: Not on file   Number of children: 3   Years of education: Not on file   Highest education level: Not on file  Occupational History   Occupation: Pharmacist, hospital  Tobacco Use   Smoking status: Never   Smokeless tobacco: Never  Substance and Sexual Activity   Alcohol use: No   Drug use: No   Sexual activity: Not on file  Other Topics Concern   Not on file  Social History Narrative   She is a divorced nonsmoker who works for [employer]. She is currently now in cardiac rehabilitation. Has changed her dietary habits to a Vegan lifestyle.   Social Determinants of Health   Financial Resource Strain: Not on file  Food Insecurity: Not on file  Transportation Needs: Not on file  Physical Activity: Not on file  Stress: Not on file  Social Connections: Not on file  Intimate Partner Violence: Not on file     Allergies  Allergen Reactions   Ace Inhibitors Cough   Other Cough    Spices make pt cough,  sneeze, eyes water   Statins     Can take Crestor     Outpatient Medications Prior to Visit  Medication Sig Dispense Refill   amLODipine (NORVASC) 2.5 MG tablet Take 2.5 mg by mouth daily.     apixaban (ELIQUIS) 5 MG TABS tablet Please continue with 2 tablets (10 mg) by mouth twice daily. On 11/19/2020 transition to 1 tablet (5 mg) twice daily. 70 tablet 0   aspirin EC 81 MG tablet Take 1 tablet (81 mg total) by mouth daily. 90 tablet 3   candesartan (ATACAND) 32 MG tablet Take 32 mg by mouth daily.     carvedilol (COREG) 12.5 MG tablet Take 12.5 mg by mouth 2 (two) times daily.     Cholecalciferol 1000 UNITS TBDP Take 1,000 Units by mouth every morning.      Coenzyme Q10 (CO Q 10 PO) Take 1 capsule by mouth daily.     glimepiride (AMARYL) 4 MG tablet  Take 4 mg by mouth daily before breakfast.     metFORMIN (GLUCOPHAGE) 500 MG tablet Take 1,000 mg by mouth 2 (two) times daily with a meal.     metroNIDAZOLE (METROCREAM) 0.75 % cream Apply 1 application topically 2 (two) times daily as needed (rosacea). Apply to face     nitroGLYCERIN (NITROSTAT) 0.4 MG SL tablet Place 1 tablet (0.4 mg total) under the tongue every 5 (five) minutes as needed for chest pain. 25 tablet 1   Omega-3 Fatty Acids (OMEGA 3 PO) Take 1 capsule by mouth daily.     pantoprazole (PROTONIX) 40 MG tablet Take 1 tablet (40 mg total) by mouth daily. 30 tablet 1   rosuvastatin (CRESTOR) 10 MG tablet Take 10 mg by mouth daily.  4   triamcinolone cream (KENALOG) 0.1 % Apply 1 application topically 2 (two) times daily as needed (rash/irritation).     candesartan (ATACAND) 16 MG tablet Take 16 mg by mouth daily.     carvedilol (COREG) 6.25 MG tablet Take 1 tablet (6.25 mg total) by mouth 2 (two) times daily with a meal. 180 tablet 2   No facility-administered medications prior to visit.    Review of Systems  Constitutional:  Negative for chills, fever, malaise/fatigue and weight loss.  HENT:  Negative for congestion, sinus pain and sore throat.   Eyes: Negative.   Respiratory:  Positive for shortness of breath. Negative for cough, hemoptysis, sputum production and wheezing.   Cardiovascular:  Negative for chest pain, palpitations, orthopnea, claudication and leg swelling.  Gastrointestinal:  Negative for abdominal pain, heartburn, nausea and vomiting.  Genitourinary: Negative.   Musculoskeletal:  Negative for joint pain and myalgias.  Skin:  Negative for rash.  Neurological:  Negative for weakness.  Endo/Heme/Allergies: Negative.   Psychiatric/Behavioral: Negative.     Objective:   Vitals:   12/18/20 1043  BP: (!) 142/80  Pulse: 62  SpO2: 100%  Weight: 224 lb (101.6 kg)  Height: 5\' 2"  (1.575 m)    Physical Exam Constitutional:      General: She is not in acute  distress.    Appearance: She is obese. She is not ill-appearing.  HENT:     Head: Normocephalic and atraumatic.  Eyes:     General: No scleral icterus.    Conjunctiva/sclera: Conjunctivae normal.     Pupils: Pupils are equal, round, and reactive to light.  Cardiovascular:     Rate and Rhythm: Normal rate and regular rhythm.     Pulses: Normal pulses.  Heart sounds: Normal heart sounds. No murmur heard. Pulmonary:     Effort: Pulmonary effort is normal.     Breath sounds: Normal breath sounds. No wheezing, rhonchi or rales.  Musculoskeletal:     Right lower leg: No edema.     Left lower leg: No edema.  Skin:    General: Skin is warm and dry.  Neurological:     General: No focal deficit present.     Mental Status: She is alert.   CBC    Component Value Date/Time   WBC 10.9 (H) 11/11/2020 0217   RBC 4.68 11/11/2020 0217   HGB 10.2 (L) 11/11/2020 0217   HCT 34.8 (L) 11/11/2020 0217   PLT 213 11/11/2020 0217   MCV 74.4 (L) 11/11/2020 0217   MCH 21.8 (L) 11/11/2020 0217   MCHC 29.3 (L) 11/11/2020 0217   RDW 17.4 (H) 11/11/2020 0217   LYMPHSABS 1.3 11/09/2020 1642   MONOABS 0.7 11/09/2020 1642   EOSABS 0.1 11/09/2020 1642   BASOSABS 0.1 11/09/2020 1642   BMP Latest Ref Rng & Units 11/13/2020 11/11/2020 11/10/2020  Glucose 70 - 99 mg/dL 112(H) 195(H) 221(H)  BUN 8 - 23 mg/dL 29(H) 25(H) 20  Creatinine 0.44 - 1.00 mg/dL 1.55(H) 1.43(H) 1.37(H)  BUN/Creat Ratio 12 - 28 - - -  Sodium 135 - 145 mmol/L 135 135 138  Potassium 3.5 - 5.1 mmol/L 3.2(L) 3.6 3.0(L)  Chloride 98 - 111 mmol/L 105 104 103  CO2 22 - 32 mmol/L 22 22 22   Calcium 8.9 - 10.3 mg/dL 8.6(L) 8.6(L) 8.9   Chest imaging: CTA Chest 11/09/20 Cardiovascular: An extensive amount of intraluminal filling defects are seen within the bilateral pulmonary arteries and numerous bilateral upper lobe, right middle lobe and bilateral lower lobe branches. No saddle embolus is identified. Mild cardiomegaly is seen with  associated right heart strain. No pericardial effusion.   Mediastinum/Nodes: No enlarged mediastinal, hilar, or axillary lymph nodes. Thyroid gland, trachea, and esophagus demonstrate no significant findings.   Lungs/Pleura: Lungs are clear. No pleural effusion or pneumothorax.   PFT: No flowsheet data found.  Labs:  Path:  Echo 11/10/20: McConnell's sign noted. LV EF 55-60%. Severe LV hypertrophy. Grade I diastolic dysfunction. RV systolic function is severely reduced. RV size is mildly enlarged. Moderate elevated PASP. LA size is mildly dilated. Small pericardial effusion present.   Heart Catheterization:  Lower Extremity Doppler US 11/10/20 RIGHT:  - Findings consistent with acute deep vein thrombosis involving the right  posterior tibial veins.  - No cystic structure found in the popliteal fossa.     LEFT:  - Findings consistent with acute deep vein thrombosis involving the left  posterior tibial veins, and left peroneal veins.  - No cystic structure found in the popliteal fossa.     Assessment & Plan:   Acute saddle pulmonary embolism with acute cor pulmonale (HCC) - Plan: ECHOCARDIOGRAM COMPLETE  Severe obesity (BMI >= 40) (HCC)  Acute deep vein thrombosis (DVT) of tibial vein of both lower extremities (HCC)  Discussion: Sandra Beasley is a 70 year old woman, never smoker with history of hypertension, DMII, CAD and hyperlipidemia who comes to pulmonary clinic for hospital follow up of pulmonary emboli and bilateral DVT.   She has done well since hospitalization. She was treated with heparin drip and then transitioned to eliquis therapy. She has no complications with bleeding at this time.   She is to continue eliquis twice daily for the DVT/PE for at least 6 months. We  will discuss stopping anticoagulation at follow up visit in April 2023. We may consider referral to Hematology for hypercoagulable workup if she wishes to stop the medicine. My recommendation would be to  continue life long anticoagulation given her risk factor of obesity and history of blood clots.  We will check cardiac echo in January 2023, 3 months after hospitalization. We will likely repeat lower extremity US if she wishes to stop anticoagulation.  She is experiencing issues with her blood pressure which her PCP is treating. The eliquis does not have a listed adverse effect of hypertension. She does not have concerning for sleep apnea and reports having a negative sleep study 10 years ago.  Freda Jackson, MD [facility] Office: [phone]   Current Outpatient Medications:    amLODipine (NORVASC) 2.5 MG tablet, Take 2.5 mg by mouth daily., Disp: , Rfl:    apixaban (ELIQUIS) 5 MG TABS tablet, Please continue with 2 tablets (10 mg) by mouth twice daily. On 11/19/2020 transition to 1 tablet (5 mg) twice daily., Disp: 70 tablet, Rfl: 0   aspirin EC 81 MG tablet, Take 1 tablet (81 mg total) by mouth daily., Disp: 90 tablet, Rfl: 3   candesartan (ATACAND) 32 MG tablet, Take 32 mg by mouth daily., Disp: , Rfl:    carvedilol (COREG) 12.5 MG tablet, Take 12.5 mg by mouth 2 (two) times daily., Disp: , Rfl:    Cholecalciferol 1000 UNITS TBDP, Take 1,000 Units by mouth every morning. , Disp: , Rfl:    Coenzyme Q10 (CO Q 10 PO), Take 1 capsule by mouth daily., Disp: , Rfl:    glimepiride (AMARYL) 4 MG tablet, Take 4 mg by mouth daily before breakfast., Disp: , Rfl:    metFORMIN (GLUCOPHAGE) 500 MG tablet, Take 1,000 mg by mouth 2 (two) times daily with a meal., Disp: , Rfl:    metroNIDAZOLE (METROCREAM) 0.75 % cream, Apply 1 application topically 2 (two) times daily as needed (rosacea). Apply to face, Disp: , Rfl:    nitroGLYCERIN (NITROSTAT) 0.4 MG SL tablet, Place 1 tablet (0.4 mg total) under the tongue every 5 (five) minutes as needed for chest pain., Disp: 25 tablet, Rfl: 1   Omega-3 Fatty Acids (OMEGA 3 PO), Take 1 capsule by mouth daily., Disp: , Rfl:    pantoprazole  (PROTONIX) 40 MG tablet, Take 1 tablet (40 mg total) by mouth daily., Disp: 30 tablet, Rfl: 1   rosuvastatin (CRESTOR) 10 MG tablet, Take 10 mg by mouth daily., Disp: , Rfl: 4   triamcinolone cream (KENALOG) 0.1 %, Apply 1 application topically 2 (two) times daily as needed (rash/irritation)., Disp: , Rfl:

## 2020-12-18 NOTE — Patient Instructions (Addendum)
Continue eliquis therapy twice daily. We will continue this for a total of six months and discuss whether to stop the therapy or continue it at the next follow up visit. We can consider hematology referral at that time.   We will check with our pharmacy team if there is a cheaper blood thinner option.  We will schedule you for a cardiac echo in January 2023 for follow up of your heart function.   Follow up in April 2023.

## 2020-12-25 ENCOUNTER — Telehealth: Payer: Self-pay | Admitting: Cardiology

## 2020-12-25 NOTE — Telephone Encounter (Signed)
Pt c/o BP issue: STAT if pt c/o blurred vision, one-sided weakness or slurred speech  1. What are your last 5 BP readings? 165/91, 181/91, 198/106, 164/95, 166/86  2. Are you having any other symptoms (ex. Dizziness, headache, blurred vision, passed out)? No just doesn't feel well   3. What is your BP issue? Bp is high

## 2020-12-25 NOTE — Telephone Encounter (Signed)
Called pt. She states she has been to her GP every week for about a month. "He keeps increasing my blood pressure medication and I  want to know if something else is going on and want to have Dr. Ellyn Hack weigh in." GP has increased all of her blood pressure medication. Pt denies dizzy, sob, headaches, but complaints of "not feeling well".  Amlodipine 2.5 mg once daily Yesterday increased coreg 25 mg twice daily Candesartan 32 mg once daily  Was on Coreg 12.5 mg twice daily.  Eliquis 5 mg twice daily was started a month ago for blood clots in her legs and lungs.  Pt has an appointment with Laurann Montana on 11/22. Pt made aware I will forward this information to Dr. Ellyn Hack.

## 2020-12-29 ENCOUNTER — Other Ambulatory Visit: Payer: Self-pay

## 2020-12-29 ENCOUNTER — Ambulatory Visit (HOSPITAL_BASED_OUTPATIENT_CLINIC_OR_DEPARTMENT_OTHER): Payer: Medicare HMO | Admitting: Family

## 2020-12-29 ENCOUNTER — Encounter (HOSPITAL_BASED_OUTPATIENT_CLINIC_OR_DEPARTMENT_OTHER): Payer: Self-pay | Admitting: Family

## 2020-12-29 VITALS — BP 160/88 | HR 66 | Ht 62.0 in | Wt 225.0 lb

## 2020-12-29 DIAGNOSIS — R0609 Other forms of dyspnea: Secondary | ICD-10-CM

## 2020-12-29 DIAGNOSIS — E785 Hyperlipidemia, unspecified: Secondary | ICD-10-CM | POA: Diagnosis not present

## 2020-12-29 DIAGNOSIS — I25118 Atherosclerotic heart disease of native coronary artery with other forms of angina pectoris: Secondary | ICD-10-CM

## 2020-12-29 DIAGNOSIS — I10 Essential (primary) hypertension: Secondary | ICD-10-CM

## 2020-12-29 MED ORDER — AMLODIPINE BESYLATE 5 MG PO TABS: 5.0000 mg | ORAL_TABLET | Freq: Every day | ORAL | 5 refills | Status: DC

## 2020-12-29 NOTE — Progress Notes (Signed)
Office Visit    Patient Name: Sandra Beasley Date of Encounter: 12/29/2020  PCP:  Lawerance Cruel, [facility]  Cardiologist:  Glenetta Hew, MD  Advanced Practice Provider:  No care team member to display Electrophysiologist:  None      Chief Complaint    Sandra Beasley is a 70 y.o. female with a hx of CAD (inferior STEMI 2014 with 2 overlapping DES to distal RCA-PDA, medial management of OM1 and LAD lesion), obesity s/p lap band, HTN, HLD, DM2 presents today for hypertension follow up.    Past Medical History    Past Medical History:  Diagnosis Date   Adenomatous polyp of colon 09/2005   CAD S/P percutaneous coronary angioplasty - DES x2 in RCA 01/02/2013; 02/2013   a. 100% RPDA - Xience Xpedition DES 2.25 mm x 15 mm + 2.25 mm x 8 mm overlapping, mod- severe distal LAD and prox OM1 lesions.;b.  Myoview 02/2013 & Jan 2020: No ischemia or Infarction, Normal EF.   GERD (gastroesophageal reflux disease) 03/06/2013   Hyperlipidemia associated with type 2 diabetes mellitus ([place])    Hypertension    Obesity    STEMI (ST elevation myocardial infarction) ([place]) 01/02/13   --> PCI; EF 60 and 65%. Gr 1 DD & No regional WMA, Otherwise relatively normal.   Type 2 diabetes mellitus with circulatory disorder (HCC)    Vitamin D deficiency    Past Surgical History:  Procedure Laterality Date   ABDOMINAL HYSTERECTOMY     CORONARY ANGIOPLASTY WITH STENT PLACEMENT  01/02/2013    PCI-RCA: Xience Xpedition 2.25 mm x 15 mm, 2.25 mm x 8 mm (2.4 mm)   LAPAROSCOPIC GASTRIC BANDING  2006   LAPAROSCOPIC HYSTERECTOMY  1996   LEFT HEART CATHETERIZATION WITH CORONARY ANGIOGRAM N/A 01/02/2013   Procedure: LEFT HEART CATHETERIZATION WITH CORONARY ANGIOGRAM;  Surgeon: Leonie Man, MD;  Location: [facility] CATH LAB;: Inferior STEMI: 100% PDA; mid-distal LAD beyond D1 (small caliber) 80%.  Ostial D1 40%.  Lateral OM 1 with 70-80% stenosis.  Large branching ramus intermedius with  OM and DIAG branch branches.   NM MYOVIEW LTD  01/'15; 1/'20   a) EF 60-65%.  Normal wall motion.  No ischemia or infarction.; b) EF 70 to 75%.  No EKG changes.  No evidence of ischemia or infarction.:   TONSILLECTOMY  1966   TRANSTHORACIC ECHOCARDIOGRAM  01/04/2013   (Post inferior STEMI) EF 60 and 65%. Grade 1 diastolic dysfunction with no regional wall motion abnormalities. Otherwise relatively normal.    Allergies  Allergies  Allergen Reactions   Ace Inhibitors Cough   Other Cough    Spices make pt cough, sneeze, eyes water   Statins     Can take Crestor    History of Present Illness    Sandra Beasley is a 70 y.o. female with a hx of CAD (inferior STEMI 2014 with 2 overlapping DES to distal RCA-PDA, medial management of OM1 and LAD lesion), obesity s/p lap band, HTN, HLD, DM2 last seen 04/15/20.  She was admitted 11/09/20 with bilateral pulmonary emboli as well as bilateral LE DVT. This occurred after a flight to [place]. Since that time has had difficulty with her BP but notes may have been elevated even prior to that time as she had not been checking routinely at home .  Presents today for follow up. PCP started her on Amlodipine 2.5mg  QD 2 weeks ago and 1 week ago Coreg  increased to 25mg  BID. We reviewed echo during admission showing reduced RVSF and elevated PASP. Not presently on diuretic but notes took one many years ago. Has repeat echo upcoming in January ordered by pulmonology. Checks her BP at home with wrist cuff. Requests manual BP checked via wrist as she reports pain with upper arm blood pressure cuff.  Current regimen: Takes her Carvedilol at 10 am and 7pm. Amlodipine 2.5mg  in the morning. Candesartan 32mg  in the morning.    EKGs/Labs/Other Studies Reviewed:   The following studies were reviewed today:   EKG:  No EKG today  Recent Labs: 05/06/2020: ALT 9 11/09/2020: B Natriuretic Peptide 1,080.1 11/11/2020: Hemoglobin 10.2; Platelets 213 11/13/2020: BUN 29;  Creatinine, Ser 1.55; Potassium 3.2; Sodium 135  Recent Lipid Panel    Component Value Date/Time   CHOL 151 05/06/2020 1012   TRIG 75 05/06/2020 1012   HDL 73 05/06/2020 1012   CHOLHDL 2.1 05/06/2020 1012   CHOLHDL 3.7 01/03/2013 0245   VLDL 22 01/03/2013 0245   LDLCALC 64 05/06/2020 1012    Home Medications   Current Meds  Medication Sig   amLODipine (NORVASC) 2.5 MG tablet Take 2.5 mg by mouth daily.   apixaban (ELIQUIS) 5 MG TABS tablet Please continue with 2 tablets (10 mg) by mouth twice daily. On 11/19/2020 transition to 1 tablet (5 mg) twice daily.   candesartan (ATACAND) 32 MG tablet Take 32 mg by mouth daily.   carvedilol (COREG) 25 MG tablet Take 25 mg by mouth 2 (two) times daily.   Cholecalciferol 1000 UNITS TBDP Take 1,000 Units by mouth every morning.    glimepiride (AMARYL) 4 MG tablet Take 4 mg by mouth daily before breakfast.   metFORMIN (GLUCOPHAGE) 500 MG tablet Take 1,000 mg by mouth 2 (two) times daily with a meal.   metroNIDAZOLE (METROCREAM) 0.75 % cream Apply 1 application topically 2 (two) times daily as needed (rosacea). Apply to face   nitroGLYCERIN (NITROSTAT) 0.4 MG SL tablet Place 1 tablet (0.4 mg total) under the tongue every 5 (five) minutes as needed for chest pain.   pantoprazole (PROTONIX) 40 MG tablet Take 1 tablet (40 mg total) by mouth daily.   rosuvastatin (CRESTOR) 10 MG tablet Take 10 mg by mouth daily.   triamcinolone cream (KENALOG) 0.1 % Apply 1 application topically 2 (two) times daily as needed (rash/irritation).     Review of Systems      All other systems reviewed and are otherwise negative except as noted above.  Physical Exam    VS:  BP (!) 160/88   Pulse 66   Ht 5\' 2"  (1.575 m)   Wt 225 lb (102.1 kg)   BMI 41.15 kg/m  , BMI Body mass index is 41.15 kg/m.  Wt Readings from Last 3 Encounters:  12/29/20 225 lb (102.1 kg)  12/18/20 224 lb (101.6 kg)  11/10/20 227 lb 4.7 oz (103.1 kg)     GEN: Well nourished, overweight,  well developed, in no acute distress. HEENT: normal. Neck: Supple, no JVD, carotid bruits, or masses. Cardiac: RRR, no murmurs, rubs, or gallops. No clubbing, cyanosis, edema. Radials/PT 2+ and equal bilaterally.  Respiratory:  Respirations regular and unlabored, clear to auscultation bilaterally. GI: Soft, nontender, nondistended. MS: No deformity or atrophy. Skin: Warm and dry, no rash. Neuro:  Strength and sensation are intact. Psych: Normal affect.  Assessment & Plan    CAD - Stable with no anginal symptoms. No indication for ischemic evaluation.  GDMT includes Crestor 10mg   QD, Carvedilol 71m BID. No aspirin due to chronic anticoagulation.  Obesity - Weight loss via diet and exercise encouraged. Discussed the impact being overweight would have on cardiovascular risk. DVT/PE - Following with primary care and pulmonology. Thought to be due to airplane travel. No bleeding complications on Eliquis.  Hypertension - BP above goal. Multiple changes have been made by primary care. To rule out alternate etiology, plan for TSH, BNP, BMP. Continue Coreg 25mg  BID, take doses 12 hours apart. Continue Candesartan 32mg  in the morning. Will increase Amlodipine to 5mg  and move to evening dosing. Offered addition of Lasix due to reduced RVSF function by echo but prefers to continue same number of medications. If additional agent needed, consider increasing dose Amlodipine and/or addition of Lasix.   Disposition: Follow up in 3 month(s) with Glenetta Hew, MD or APP.  Signed, Loel Dubonnet, NP 12/29/2020, 3:15 PM  [facility]

## 2020-12-29 NOTE — Patient Instructions (Addendum)
Medication Instructions:  Your physician has recommended you make the following change in your medication:   CHANGE Amlodipine to 5mg  in the evening  (Take 2.5mg  tonight then 5 mg tomorrow night)  Please take your doses of Carvedilol close to 12 hours apart.   *If you need a refill on your cardiac medications before your next appointment, please call your pharmacy*   Lab Work: Your physician recommends that you return for lab work today: TSH, BMP, BNP  If you have labs (blood work) drawn today and your tests are completely normal, you will receive your results only by: [place] (if you have MyChart) OR A paper copy in the mail If you have any lab test that is abnormal or we need to change your treatment, we will call you to review the results.   Testing/Procedures: None ordered today   Follow-Up: At [facility], you and your health needs are our priority.  As part of our continuing mission to provide you with exceptional heart care, we have created designated Provider Care Teams.  These Care Teams include your primary Cardiologist (physician) and Advanced Practice Providers (APPs -  Physician Assistants and Nurse Practitioners) who all work together to provide you with the care you need, when you need it.  We recommend signing up for the patient portal called "MyChart".  Sign up information is provided on this After Visit Summary.  MyChart is used to connect with patients for Virtual Visits (Telemedicine).  Patients are able to view lab/test results, encounter notes, upcoming appointments, etc.  Non-urgent messages can be sent to your provider as well.   To learn more about what you can do with MyChart, go to [email or web address].    Your next appointment:   3 month(s)  The format for your next appointment:   In Person  Provider:   Glenetta Hew, MD or Advanced Practice Provider    Other Instructions   Recommend taking your doses of Carvedilol and Eliquis close  to 12 hours apart.   Recommend checking your blood pressure once per day at least 2 hours after your medications.   Tips to Measure your Blood Pressure Correctly  Here's what you can do to ensure a correct reading:  Don't drink a caffeinated beverage or smoke during the 30 minutes before the test.  Sit quietly for five minutes before the test begins.  During the measurement, sit in a chair with your feet on the floor and your arm supported so your elbow is at about heart level.  The inflatable part of the cuff should completely cover at least 80% of your upper arm, and the cuff should be placed on bare skin, not over a shirt.  Don't talk during the measurement.  Have your blood pressure measured twice, with a brief break in between. If the readings are different by 5 points or more, have it done a third time.   Blood pressure categories  Blood pressure category SYSTOLIC (upper number)  DIASTOLIC (lower number)  Normal Less than 120 mm Hg and Less than 80 mm Hg  Elevated 120-129 mm Hg and Less than 80 mm Hg  High blood pressure: Stage 1 hypertension 130-139 mm Hg or 80-89 mm Hg  High blood pressure: Stage 2 hypertension 140 mm Hg or higher or 90 mm Hg or higher  Hypertensive crisis (consult your doctor immediately) Higher than 180 mm Hg and/or Higher than 120 mm Hg  Source: American Heart Association and American Stroke Association. For more on getting  your blood pressure under control, buy Controlling Your Blood Pressure, a Special Health Report from [facility].   Blood Pressure Log   Date   Time  Blood Pressure  Position  Example: Nov 1 9 AM 124/78 sitting

## 2020-12-30 LAB — BASIC METABOLIC PANEL
BUN/Creatinine Ratio: 15 (ref 12–28)
BUN: 18 mg/dL (ref 8–27)
CO2: 20 mmol/L (ref 20–29)
Calcium: 9.4 mg/dL (ref 8.7–10.3)
Chloride: 106 mmol/L (ref 96–106)
Creatinine, Ser: 1.22 mg/dL — ABNORMAL HIGH (ref 0.57–1.00)
Glucose: 184 mg/dL — ABNORMAL HIGH (ref 70–99)
Potassium: 4.1 mmol/L (ref 3.5–5.2)
Sodium: 140 mmol/L (ref 134–144)
eGFR: 47 mL/min/{1.73_m2} — ABNORMAL LOW (ref >59)

## 2020-12-30 LAB — BRAIN NATRIURETIC PEPTIDE: BNP: 68.5 pg/mL (ref 0.0–100.0)

## 2020-12-30 LAB — TSH: TSH: 1.49 u[IU]/mL (ref 0.450–4.500)

## 2021-01-04 ENCOUNTER — Encounter (HOSPITAL_BASED_OUTPATIENT_CLINIC_OR_DEPARTMENT_OTHER): Payer: Self-pay

## 2021-01-14 DIAGNOSIS — I10 Essential (primary) hypertension: Secondary | ICD-10-CM | POA: Diagnosis not present

## 2021-01-14 DIAGNOSIS — E1169 Type 2 diabetes mellitus with other specified complication: Secondary | ICD-10-CM | POA: Diagnosis not present

## 2021-01-14 DIAGNOSIS — K219 Gastro-esophageal reflux disease without esophagitis: Secondary | ICD-10-CM | POA: Diagnosis not present

## 2021-01-14 DIAGNOSIS — E782 Mixed hyperlipidemia: Secondary | ICD-10-CM | POA: Diagnosis not present

## 2021-01-18 ENCOUNTER — Encounter (HOSPITAL_BASED_OUTPATIENT_CLINIC_OR_DEPARTMENT_OTHER): Payer: Self-pay

## 2021-01-18 NOTE — Progress Notes (Signed)
Results mailed to  pt.

## 2021-01-26 ENCOUNTER — Ambulatory Visit (INDEPENDENT_AMBULATORY_CARE_PROVIDER_SITE_OTHER): Payer: Medicare HMO | Admitting: Podiatry

## 2021-01-26 ENCOUNTER — Other Ambulatory Visit: Payer: Self-pay

## 2021-01-26 DIAGNOSIS — M79675 Pain in left toe(s): Secondary | ICD-10-CM | POA: Diagnosis not present

## 2021-01-26 DIAGNOSIS — L84 Corns and callosities: Secondary | ICD-10-CM | POA: Diagnosis not present

## 2021-01-26 DIAGNOSIS — B351 Tinea unguium: Secondary | ICD-10-CM | POA: Diagnosis not present

## 2021-01-26 DIAGNOSIS — Z7901 Long term (current) use of anticoagulants: Secondary | ICD-10-CM

## 2021-01-26 DIAGNOSIS — E1159 Type 2 diabetes mellitus with other circulatory complications: Secondary | ICD-10-CM

## 2021-01-26 DIAGNOSIS — M79674 Pain in right toe(s): Secondary | ICD-10-CM

## 2021-01-28 NOTE — Progress Notes (Signed)
°  Subjective:  Patient ID: Sandra Beasley, female    DOB: August 09, 1950,  MRN: [id number]  Painful thickened elongated nails, calluses returned as well  70 y.o. female returns for follow-up with the above complaint. History confirmed with patient.  The nails are becoming thickened elongated again.  The calluses are also thick again.  Since last visit she had leg DVT and bilateral PE she is now on Eliquis for this  Objective:  Physical Exam: warm, good capillary refill, no trophic changes or ulcerative lesions, normal DP and PT pulses, normal monofilament exam, normal sensory exam and onychomycosis.  Pinch callus bilateral hallux.  Nails elongated thickened with some subungual debris Assessment:   1. Callus of foot   2. Type 2 diabetes mellitus with other circulatory complications (HCC)   3. Pain due to onychomycosis of toenails of both feet   4. Chronic anticoagulation       Plan:  Patient was evaluated and treated and all questions answered.  Patient educated on diabetes. Discussed proper diabetic foot care and discussed risks and complications of disease. Educated patient in depth on reasons to return to the office immediately should he/she discover anything concerning or new on the feet. All questions answered. Discussed proper shoes as well.   All symptomatic hyperkeratoses were safely debrided with a sterile #15 blade to patient's level of comfort without incident. We discussed preventative and palliative care of these lesions including supportive and accommodative shoegear, padding, prefabricated and custom molded accommodative orthoses, use of a pumice stone and lotions/creams daily.  Discussed secondary effects of DVT including leg swelling wearing compression stockings and elevating the leg after activity.  So far not significant pitting edema noted   No follow-ups on file.

## 2021-02-17 ENCOUNTER — Other Ambulatory Visit: Payer: Self-pay

## 2021-02-17 ENCOUNTER — Ambulatory Visit (HOSPITAL_COMMUNITY): Payer: Medicare HMO | Attending: Pulmonary Disease

## 2021-02-17 DIAGNOSIS — I2602 Saddle embolus of pulmonary artery with acute cor pulmonale: Secondary | ICD-10-CM | POA: Insufficient documentation

## 2021-02-17 LAB — ECHOCARDIOGRAM COMPLETE
Area-P 1/2: 2.99 cm2
S' Lateral: 2.9 cm

## 2021-02-23 HISTORY — PX: TRANSTHORACIC ECHOCARDIOGRAM: SHX275

## 2021-02-24 ENCOUNTER — Telehealth: Payer: Self-pay | Admitting: Pulmonary Disease

## 2021-02-24 NOTE — Telephone Encounter (Signed)
Called and left message for patient to call office back

## 2021-03-02 DIAGNOSIS — E782 Mixed hyperlipidemia: Secondary | ICD-10-CM | POA: Diagnosis not present

## 2021-03-02 DIAGNOSIS — E1169 Type 2 diabetes mellitus with other specified complication: Secondary | ICD-10-CM | POA: Diagnosis not present

## 2021-03-02 DIAGNOSIS — N183 Chronic kidney disease, stage 3 unspecified: Secondary | ICD-10-CM | POA: Diagnosis not present

## 2021-03-02 DIAGNOSIS — I10 Essential (primary) hypertension: Secondary | ICD-10-CM | POA: Diagnosis not present

## 2021-03-02 DIAGNOSIS — K219 Gastro-esophageal reflux disease without esophagitis: Secondary | ICD-10-CM | POA: Diagnosis not present

## 2021-03-02 NOTE — Telephone Encounter (Signed)
Called patient and left voicemail per her DPR that her cardiac echo was normal compared to the one in October per Dr Erin Fulling. Told patient that if she had any questions or concerns to give the office a call. Nothing further needed at this time

## 2021-03-24 DIAGNOSIS — Z1231 Encounter for screening mammogram for malignant neoplasm of breast: Secondary | ICD-10-CM | POA: Diagnosis not present

## 2021-03-25 DIAGNOSIS — Z1211 Encounter for screening for malignant neoplasm of colon: Secondary | ICD-10-CM | POA: Diagnosis not present

## 2021-04-07 ENCOUNTER — Telehealth: Payer: Self-pay | Admitting: Cardiology

## 2021-04-07 NOTE — Telephone Encounter (Signed)
-  Pt state she saw her pulmonologist who recommended to avoid long car ride or travel by plane. ?-Pt state she has a wedding to attend and love to travel. She state she want to be able to make plans for the summer.  ?-She thought as long as she was on Eliquis she should be fine. ?-She report she has a cousin who is an air pilot who has had several blood clots but continues to fly and wondering why she is an exception.  ?-Pt state she is requesting a 2nd opinion from Dr. Ellyn Hack ?-Will forward to MD ?

## 2021-04-07 NOTE — Telephone Encounter (Signed)
Patient calling in to get as 2nd opinion on her pulmonary dr tell her not to travel because of her blood clots. Please advise  ?

## 2021-04-09 NOTE — Telephone Encounter (Signed)
I do not necessarily thank you fever reason to not travel if she is taking Eliquis.  Would recommend using support stockings while doing any prolonged traveling. ? ?I do not know the reasoning behind the pulmonologist recommendation, so I would not want to go against that unless I knew the thought process. ? ? ?Glenetta Hew, MD ? ?

## 2021-04-09 NOTE — Telephone Encounter (Signed)
I believe I instructed the patient not to travel during the initial weeks after her hospitalization, there was no travel restriction beyond that time line.  ? ?Thanks, ?JD ?

## 2021-04-09 NOTE — Telephone Encounter (Signed)
Spoke to patient. Information given from Dr Ellyn Hack. ? Patient states she has not seen the pulmonologist since initial hospitalization in Oct 2023 and few weeks out of the hospital.  ?RN recommend patient to contact pulmonologist  again for  reasoning why seh could not travel. ?Patient verbalized understanding. ?

## 2021-04-13 NOTE — Telephone Encounter (Signed)
Left detail message to patient of Dr Erin Fulling response to question . Any question may call back ?

## 2021-04-14 DIAGNOSIS — H2513 Age-related nuclear cataract, bilateral: Secondary | ICD-10-CM | POA: Diagnosis not present

## 2021-04-14 DIAGNOSIS — E119 Type 2 diabetes mellitus without complications: Secondary | ICD-10-CM | POA: Diagnosis not present

## 2021-04-14 DIAGNOSIS — Z7984 Long term (current) use of oral hypoglycemic drugs: Secondary | ICD-10-CM | POA: Diagnosis not present

## 2021-04-26 DIAGNOSIS — Z0000 Encounter for general adult medical examination without abnormal findings: Secondary | ICD-10-CM | POA: Diagnosis not present

## 2021-04-26 DIAGNOSIS — I10 Essential (primary) hypertension: Secondary | ICD-10-CM | POA: Diagnosis not present

## 2021-04-26 DIAGNOSIS — Z9884 Bariatric surgery status: Secondary | ICD-10-CM | POA: Diagnosis not present

## 2021-04-26 DIAGNOSIS — I129 Hypertensive chronic kidney disease with stage 1 through stage 4 chronic kidney disease, or unspecified chronic kidney disease: Secondary | ICD-10-CM | POA: Diagnosis not present

## 2021-04-26 DIAGNOSIS — N183 Chronic kidney disease, stage 3 unspecified: Secondary | ICD-10-CM | POA: Diagnosis not present

## 2021-04-26 DIAGNOSIS — E538 Deficiency of other specified B group vitamins: Secondary | ICD-10-CM | POA: Diagnosis not present

## 2021-04-26 DIAGNOSIS — E1169 Type 2 diabetes mellitus with other specified complication: Secondary | ICD-10-CM | POA: Diagnosis not present

## 2021-04-26 DIAGNOSIS — Z8349 Family history of other endocrine, nutritional and metabolic diseases: Secondary | ICD-10-CM | POA: Diagnosis not present

## 2021-04-26 DIAGNOSIS — E559 Vitamin D deficiency, unspecified: Secondary | ICD-10-CM | POA: Diagnosis not present

## 2021-04-26 DIAGNOSIS — K219 Gastro-esophageal reflux disease without esophagitis: Secondary | ICD-10-CM | POA: Diagnosis not present

## 2021-04-26 DIAGNOSIS — E782 Mixed hyperlipidemia: Secondary | ICD-10-CM | POA: Diagnosis not present

## 2021-05-03 ENCOUNTER — Other Ambulatory Visit: Payer: Self-pay

## 2021-05-03 ENCOUNTER — Ambulatory Visit (INDEPENDENT_AMBULATORY_CARE_PROVIDER_SITE_OTHER): Payer: Medicare HMO | Admitting: Podiatry

## 2021-05-03 DIAGNOSIS — E1159 Type 2 diabetes mellitus with other circulatory complications: Secondary | ICD-10-CM | POA: Diagnosis not present

## 2021-05-03 DIAGNOSIS — L84 Corns and callosities: Secondary | ICD-10-CM

## 2021-05-03 DIAGNOSIS — M79674 Pain in right toe(s): Secondary | ICD-10-CM

## 2021-05-03 DIAGNOSIS — Z7901 Long term (current) use of anticoagulants: Secondary | ICD-10-CM

## 2021-05-03 DIAGNOSIS — B351 Tinea unguium: Secondary | ICD-10-CM | POA: Diagnosis not present

## 2021-05-03 DIAGNOSIS — M79675 Pain in left toe(s): Secondary | ICD-10-CM

## 2021-05-04 ENCOUNTER — Telehealth: Payer: Self-pay | Admitting: Physician Assistant

## 2021-05-04 NOTE — Progress Notes (Signed)
?  Subjective:  ?Patient ID: Sandra Beasley, female    DOB: 09-18-1950,  MRN: [id number] ? ?Painful thickened elongated nails, calluses returned as well ? ?71 y.o. female returns for follow-up with the above complaint. History confirmed with patient.  The nails are becoming thickened elongated again.  The calluses are also thick again.  She still taking Eliquis for her DVT ? ?Objective:  ?Physical Exam: ?warm, good capillary refill, no trophic changes or ulcerative lesions, normal DP and PT pulses, normal monofilament exam, normal sensory exam and onychomycosis.  Pinch callus bilateral hallux.  Nails elongated thickened with some subungual debris ?Assessment:  ? ?1. Pain due to onychomycosis of toenails of both feet   ?2. Type 2 diabetes mellitus with other circulatory complications ([place])   ?3. Callus of foot   ?4. Chronic anticoagulation   ? ? ? ? ? ?Plan:  ?Patient was evaluated and treated and all questions answered. ? ?Patient educated on diabetes. Discussed proper diabetic foot care and discussed risks and complications of disease. Educated patient in depth on reasons to return to the office immediately should he/she discover anything concerning or new on the feet. All questions answered. Discussed proper shoes as well.  ? ?All symptomatic hyperkeratoses were safely debrided with a sterile #15 blade to patient's level of comfort without incident. We discussed preventative and palliative care of these lesions including supportive and accommodative shoegear, padding, prefabricated and custom molded accommodative orthoses, use of a pumice stone and lotions/creams daily. ? ? ? ?Return in about 3 months (around 08/03/2021) for at risk diabetic foot care.  ? ?

## 2021-05-04 NOTE — Telephone Encounter (Signed)
Scheduled appt per 3/28 referral. Pt is aware of appt date and time. Pt is aware to arrive 15 mins prior to appt time and to bring and updated insurance card. Pt is aware of appt location.   ?

## 2021-05-17 ENCOUNTER — Other Ambulatory Visit: Payer: Self-pay

## 2021-05-17 ENCOUNTER — Encounter: Payer: Self-pay | Admitting: Physician Assistant

## 2021-05-17 ENCOUNTER — Inpatient Hospital Stay: Payer: Medicare HMO | Attending: Physician Assistant | Admitting: Physician Assistant

## 2021-05-17 ENCOUNTER — Inpatient Hospital Stay: Payer: Medicare HMO

## 2021-05-17 VITALS — BP 120/85 | HR 67 | Temp 97.5°F | Resp 18 | Wt 235.5 lb

## 2021-05-17 DIAGNOSIS — Z90710 Acquired absence of both cervix and uterus: Secondary | ICD-10-CM | POA: Insufficient documentation

## 2021-05-17 DIAGNOSIS — E1159 Type 2 diabetes mellitus with other circulatory complications: Secondary | ICD-10-CM | POA: Diagnosis not present

## 2021-05-17 DIAGNOSIS — I10 Essential (primary) hypertension: Secondary | ICD-10-CM | POA: Diagnosis not present

## 2021-05-17 DIAGNOSIS — D509 Iron deficiency anemia, unspecified: Secondary | ICD-10-CM

## 2021-05-17 DIAGNOSIS — I82403 Acute embolism and thrombosis of unspecified deep veins of lower extremity, bilateral: Secondary | ICD-10-CM | POA: Diagnosis not present

## 2021-05-17 DIAGNOSIS — Z803 Family history of malignant neoplasm of breast: Secondary | ICD-10-CM | POA: Insufficient documentation

## 2021-05-17 DIAGNOSIS — Z7901 Long term (current) use of anticoagulants: Secondary | ICD-10-CM | POA: Diagnosis not present

## 2021-05-17 DIAGNOSIS — I2699 Other pulmonary embolism without acute cor pulmonale: Secondary | ICD-10-CM

## 2021-05-17 LAB — CMP (CANCER CENTER ONLY)
ALT: 7 U/L (ref 0–44)
AST: 8 U/L — ABNORMAL LOW (ref 15–41)
Albumin: 3.8 g/dL (ref 3.5–5.0)
Alkaline Phosphatase: 59 U/L (ref 38–126)
Anion gap: 8 (ref 5–15)
BUN: 21 mg/dL (ref 8–23)
CO2: 22 mmol/L (ref 22–32)
Calcium: 9 mg/dL (ref 8.9–10.3)
Chloride: 110 mmol/L (ref 98–111)
Creatinine: 1.32 mg/dL — ABNORMAL HIGH (ref 0.44–1.00)
GFR, Estimated: 43 mL/min — ABNORMAL LOW (ref >60)
Glucose, Bld: 207 mg/dL — ABNORMAL HIGH (ref 70–99)
Potassium: 3.9 mmol/L (ref 3.5–5.1)
Sodium: 140 mmol/L (ref 135–145)
Total Bilirubin: 0.5 mg/dL (ref 0.3–1.2)
Total Protein: 6.9 g/dL (ref 6.5–8.1)

## 2021-05-17 LAB — IRON AND IRON BINDING CAPACITY (CC-WL,HP ONLY)
Iron: 25 ug/dL — ABNORMAL LOW (ref 28–170)
Saturation Ratios: 6 % — ABNORMAL LOW (ref 10.4–31.8)
TIBC: 433 ug/dL (ref 250–450)
UIBC: 408 ug/dL (ref 148–442)

## 2021-05-17 LAB — CBC WITH DIFFERENTIAL (CANCER CENTER ONLY)
Abs Immature Granulocytes: 0.02 10*3/uL (ref 0.00–0.07)
Basophils Absolute: 0.1 10*3/uL (ref 0.0–0.1)
Basophils Relative: 1 %
Eosinophils Absolute: 0.1 10*3/uL (ref 0.0–0.5)
Eosinophils Relative: 2 %
HCT: 30.8 % — ABNORMAL LOW (ref 36.0–46.0)
Hemoglobin: 8.9 g/dL — ABNORMAL LOW (ref 12.0–15.0)
Immature Granulocytes: 0 %
Lymphocytes Relative: 24 %
Lymphs Abs: 1.4 10*3/uL (ref 0.7–4.0)
MCH: 21.9 pg — ABNORMAL LOW (ref 26.0–34.0)
MCHC: 28.9 g/dL — ABNORMAL LOW (ref 30.0–36.0)
MCV: 75.9 fL — ABNORMAL LOW (ref 80.0–100.0)
Monocytes Absolute: 0.4 10*3/uL (ref 0.1–1.0)
Monocytes Relative: 7 %
Neutro Abs: 3.9 10*3/uL (ref 1.7–7.7)
Neutrophils Relative %: 66 %
Platelet Count: 203 10*3/uL (ref 150–400)
RBC: 4.06 MIL/uL (ref 3.87–5.11)
RDW: 16.4 % — ABNORMAL HIGH (ref 11.5–15.5)
WBC Count: 6 10*3/uL (ref 4.0–10.5)
nRBC: 0 % (ref 0.0–0.2)

## 2021-05-17 LAB — RETIC PANEL
Immature Retic Fract: 21 % — ABNORMAL HIGH (ref 2.3–15.9)
RBC.: 4.05 MIL/uL (ref 3.87–5.11)
Retic Count, Absolute: 45.4 10*3/uL (ref 19.0–186.0)
Retic Ct Pct: 1.1 % (ref 0.4–3.1)
Reticulocyte Hemoglobin: 22.1 pg — ABNORMAL LOW (ref >27.9)

## 2021-05-17 LAB — FOLATE: Folate: 5.4 ng/mL — ABNORMAL LOW (ref >5.9)

## 2021-05-17 LAB — VITAMIN B12: Vitamin B-12: 199 pg/mL (ref 180–914)

## 2021-05-17 NOTE — Progress Notes (Signed)
?[place] ?[phone]   [phone] ? ?INITIAL CONSULT NOTE ? ?Patient Care Team: ?Lawerance Cruel, MD as PCP - General (Family Medicine) ?Leonie Man, MD as PCP - Cardiology (Cardiology) ? ?Hematological/Oncological History ?-11/09/2020-11/13/2020: Admitted due to progressive dyspnea.  CT angiogram of the chest showed extensive bilateral pulmonary emboli with associated right heart strain.  Doppler ultrasound revealed acute DVT involving the left posterior tibial veins, left peroneal veins and right posterior tibial veins.  Patient was started on IV heparin drip and transition to Eliquis.   ? ?-05/17/2021: Establish care with Sandra Beasley ? ?CHIEF COMPLAINTS/PURPOSE OF CONSULTATION:  ?"Bilateral DVT and pulmonary emboli " ? ?HISTORY OF PRESENTING ILLNESS:  ?Sandra Beasley 71 y.o. female with medical history significant for gastric banding, GERD, hyperlipidemia, hypertension, T2DM, STEMI s/p coronary stent placement, vitamin D deficiency and anemia. She is unaccompanied for this visit. ? ?On exam today,Sandra Beasley reports her energy levels are overall stable and she is planning to resume her exercise routine.  She reports that her shortness of breath has markedly improved since initiating Eliquis therapy.  She has occasional episodes of shortness of breath with heavy exertion.  She denies any appetite changes or recent weight loss.  She denies nausea, vomiting or abdominal pain.  Her bowel habits are unchanged without any diarrhea or constipation.  She denies easy bruising or signs of active bleeding.  Patient has trace amount of lower extremity edema.  She denies fevers, chills, night sweats, chest pain or cough.  She has no other complaints.  Rest of the 10 point ROS is below ? ?MEDICAL HISTORY:  ?Past Medical History:  ?Diagnosis Date  ? Adenomatous polyp of colon 09/2005  ? CAD S/P percutaneous coronary angioplasty - DES x2 in RCA 01/02/2013; 02/2013  ? a. 100%  RPDA - Xience Xpedition DES 2.25 mm x 15 mm + 2.25 mm x 8 mm overlapping, mod- severe distal LAD and prox OM1 lesions.;b.  Myoview 02/2013 & Jan 2020: No ischemia or Infarction, Normal EF.  ? GERD (gastroesophageal reflux disease) 03/06/2013  ? Hyperlipidemia associated with type 2 diabetes mellitus ([place])   ? Hypertension   ? Obesity   ? STEMI (ST elevation myocardial infarction) ([place]) 01/02/13  ? --> PCI; EF 60 and 65%. Gr 1 DD & No regional WMA, Otherwise relatively normal.  ? Type 2 diabetes mellitus with circulatory disorder ([place])   ? Vitamin D deficiency   ? ? ?SURGICAL HISTORY: ?Past Surgical History:  ?Procedure Laterality Date  ? ABDOMINAL HYSTERECTOMY    ? CORONARY ANGIOPLASTY WITH STENT PLACEMENT  01/02/2013   ? PCI-RCA: Xience Xpedition 2.25 mm x 15 mm, 2.25 mm x 8 mm (2.4 mm)  ? LAPAROSCOPIC GASTRIC BANDING  2006  ? LAPAROSCOPIC HYSTERECTOMY  1996  ? LEFT HEART CATHETERIZATION WITH CORONARY ANGIOGRAM N/A 01/02/2013  ? Procedure: LEFT HEART CATHETERIZATION WITH CORONARY ANGIOGRAM;  Surgeon: Leonie Man, MD;  Location: [facility] CATH LAB;: Inferior STEMI: 100% PDA; mid-distal LAD beyond D1 (small caliber) 80%.  Ostial D1 40%.  Lateral OM 1 with 70-80% stenosis.  Large branching ramus intermedius with OM and DIAG branch branches.  ? NM MYOVIEW LTD  01/'15; 1/'20  ? a) EF 60-65%.  Normal wall motion.  No ischemia or infarction.; b) EF 70 to 75%.  No EKG changes.  No evidence of ischemia or infarction.:  ? TONSILLECTOMY  1966  ? TRANSTHORACIC ECHOCARDIOGRAM  01/04/2013  ? (Post inferior STEMI) EF 60 and 65%. Grade  1 diastolic dysfunction with no regional wall motion abnormalities. Otherwise relatively normal.  ? ? ?SOCIAL HISTORY: ?Social History  ? ?Socioeconomic History  ? Marital status: Divorced  ?  Spouse name: Not on file  ? Number of children: 3  ? Years of education: Not on file  ? Highest education level: Not on file  ?Occupational History  ? Occupation: Pharmacist, hospital  ?Tobacco Use  ? Smoking status: Never  ?  Smokeless tobacco: Never  ?Substance and Sexual Activity  ? Alcohol use: No  ? Drug use: No  ? Sexual activity: Not on file  ?Other Topics Concern  ? Not on file  ?Social History Narrative  ? She is a divorced nonsmoker who works for [employer]. She is currently now in cardiac rehabilitation. Has changed her dietary habits to a Vegan lifestyle.  ? ?Social Determinants of Health  ? ?Financial Resource Strain: Not on file  ?Food Insecurity: Not on file  ?Transportation Needs: Not on file  ?Physical Activity: Not on file  ?Stress: Not on file  ?Social Connections: Not on file  ?Intimate Partner Violence: Not on file  ? ? ?FAMILY HISTORY: ?Family History  ?Adopted: Yes  ?Problem Relation Age of Onset  ? Breast cancer Maternal Aunt   ? Diabetes Maternal Aunt   ? Heart disease Maternal Aunt   ? Diabetes Maternal Uncle   ? Diabetes gravidarum Maternal Uncle   ? Colon cancer Neg Hx   ? Stomach cancer Neg Hx   ? Rectal cancer Neg Hx   ? ? ?ALLERGIES:  is allergic to ace inhibitors, other, and statins. ? ?MEDICATIONS:  ?Current Outpatient Medications  ?Medication Sig Dispense Refill  ? amLODipine (NORVASC) 5 MG tablet Take 1 tablet (5 mg total) by mouth at bedtime. 30 tablet 5  ? apixaban (ELIQUIS) 5 MG TABS tablet Please continue with 2 tablets (10 mg) by mouth twice daily. On 11/19/2020 transition to 1 tablet (5 mg) twice daily. 70 tablet 0  ? candesartan (ATACAND) 32 MG tablet Take 32 mg by mouth daily.    ? carvedilol (COREG) 25 MG tablet Take 25 mg by mouth 2 (two) times daily.    ? Cholecalciferol 1000 UNITS TBDP Take 1,000 Units by mouth every morning.     ? glimepiride (AMARYL) 4 MG tablet Take 4 mg by mouth daily before breakfast.    ? metFORMIN (GLUCOPHAGE) 500 MG tablet Take 1,000 mg by mouth 2 (two) times daily with a meal.    ? metroNIDAZOLE (METROCREAM) 0.75 % cream Apply 1 application topically 2 (two) times daily as needed (rosacea). Apply to face    ? nitroGLYCERIN (NITROSTAT) 0.4 MG SL tablet Place 1  tablet (0.4 mg total) under the tongue every 5 (five) minutes as needed for chest pain. 25 tablet 1  ? pantoprazole (PROTONIX) 40 MG tablet Take 1 tablet (40 mg total) by mouth daily. 30 tablet 1  ? rosuvastatin (CRESTOR) 10 MG tablet Take 10 mg by mouth daily.  4  ? triamcinolone cream (KENALOG) 0.1 % Apply 1 application topically 2 (two) times daily as needed (rash/irritation).    ? folic acid (FOLVITE) 1 MG tablet Take 1 tablet (1 mg total) by mouth daily. 30 tablet 6  ? ?No current facility-administered medications for this visit.  ? ? ?REVIEW OF SYSTEMS:   ?Constitutional: ( - ) fevers, ( - )  chills , ( - ) night sweats ?Eyes: ( - ) blurriness of vision, ( - ) double vision, ( - )  watery eyes ?Ears, nose, mouth, throat, and face: ( - ) mucositis, ( - ) sore throat ?Respiratory: ( - ) cough, ( - ) dyspnea, ( - ) wheezes ?Cardiovascular: ( - ) palpitation, ( - ) chest discomfort, ( + ) lower extremity swelling ?Gastrointestinal:  ( - ) nausea, ( - ) heartburn, ( - ) change in bowel habits ?Skin: ( - ) abnormal skin rashes ?Lymphatics: ( - ) new lymphadenopathy, ( - ) easy bruising ?Neurological: ( - ) numbness, ( - ) tingling, ( - ) new weaknesses ?Behavioral/Psych: ( - ) mood change, ( - ) new changes  ?All other systems were reviewed with the patient and are negative. ? ?PHYSICAL EXAMINATION: ?ECOG PERFORMANCE STATUS: 1 - Symptomatic but completely ambulatory ? ?Vitals:  ? 05/17/21 1413  ?BP: 120/85  ?Pulse: 67  ?Resp: 18  ?Temp: (!) 97.5 ?F (36.4 ?C)  ?SpO2: 99%  ? ?Filed Weights  ? 05/17/21 1413  ?Weight: 235 lb 8 oz (106.8 kg)  ? ? ?GENERAL: well appearing female in NAD  ?SKIN: skin color, texture, turgor are normal, no rashes or significant lesions ?EYES: conjunctiva are pink and non-injected, sclera clear ?OROPHARYNX: no exudate, no erythema; lips, buccal mucosa, and tongue normal  ?NECK: supple, non-tender ?LYMPH:  no palpable lymphadenopathy in the cervical or supraclavicular lymph nodes.  ?LUNGS: clear  to auscultation and percussion with normal breathing effort ?HEART: regular rate & rhythm and no murmurs and no lower extremity edema ?ABDOMEN: soft, non-tender, non-distended, normal bowel sounds ?[place]

## 2021-05-18 ENCOUNTER — Telehealth: Payer: Self-pay | Admitting: Physician Assistant

## 2021-05-18 LAB — BETA-2-GLYCOPROTEIN I ABS, IGG/M/A
Beta-2 Glyco I IgG: <9 GPI IgG units (ref 0–20)
Beta-2-Glycoprotein I IgA: <9 GPI IgA units (ref 0–25)
Beta-2-Glycoprotein I IgM: <9 GPI IgM units (ref 0–32)

## 2021-05-18 LAB — FERRITIN: Ferritin: <4 ng/mL — ABNORMAL LOW (ref 11–307)

## 2021-05-18 NOTE — Telephone Encounter (Signed)
Scheduled follow-up appointment per 4/10 los. Patient is aware. ?

## 2021-05-19 LAB — DRVVT MIX: dRVVT Mix: 51.2 s — ABNORMAL HIGH (ref 0.0–40.4)

## 2021-05-19 LAB — LUPUS ANTICOAGULANT PANEL
DRVVT: 64.4 s — ABNORMAL HIGH (ref 0.0–47.0)
PTT Lupus Anticoagulant: 30.8 s (ref 0.0–43.5)

## 2021-05-19 LAB — METHYLMALONIC ACID, SERUM: Methylmalonic Acid, Quantitative: 188 nmol/L (ref 0–378)

## 2021-05-19 LAB — CARDIOLIPIN ANTIBODIES, IGG, IGM, IGA
Anticardiolipin IgA: <9 APL U/mL (ref 0–11)
Anticardiolipin IgG: <9 GPL U/mL (ref 0–14)
Anticardiolipin IgM: <9 MPL U/mL (ref 0–12)

## 2021-05-19 LAB — DRVVT CONFIRM: dRVVT Confirm: 0.9 ratio (ref 0.8–1.2)

## 2021-05-20 ENCOUNTER — Telehealth: Payer: Self-pay | Admitting: Physician Assistant

## 2021-05-20 MED ORDER — FOLIC ACID 1 MG PO TABS: 1.0000 mg | ORAL_TABLET | Freq: Every day | ORAL | 6 refills | Status: DC

## 2021-05-20 NOTE — Telephone Encounter (Signed)
I called [name] to review the lab results 05/17/2021. Findings revealed no evidence of antiphospholipid syndrome. There is evidence of microcytic anemia with iron, B12 and folate deficiency.  ? ?We recommend IV iron infusions, IM B12 injections and starting folic acid supplementation.  I will send a prescription for folic acid to the pharmacy on file.  Patient requested if she can arrange IV iron and B12 injections to her PCP, Dr. Harrington Challenger.  We will reach out to Dr. Harrington Challenger to see if he can coordinate this. ? ?We will see the patient back in 3 months with labs to monitor blood counts.  She expressed understanding and satisfaction with plan provided. ?

## 2021-05-20 NOTE — Telephone Encounter (Signed)
Scheduled per 4/13 secure chat w Murray Hodgkins, message has been left with pt ?

## 2021-05-21 ENCOUNTER — Telehealth: Payer: Self-pay

## 2021-05-21 NOTE — Telephone Encounter (Signed)
Pt has requested her PCP arrange her IV iron infusions and her Vitamin B12 injections.  Labs and office notes faxed to Dr Nathanial Millman at [facility]  [phone].  Confirmation received ?

## 2021-06-18 DIAGNOSIS — E538 Deficiency of other specified B group vitamins: Secondary | ICD-10-CM | POA: Diagnosis not present

## 2021-06-18 DIAGNOSIS — H25013 Cortical age-related cataract, bilateral: Secondary | ICD-10-CM | POA: Diagnosis not present

## 2021-06-18 DIAGNOSIS — H18413 Arcus senilis, bilateral: Secondary | ICD-10-CM | POA: Diagnosis not present

## 2021-06-18 DIAGNOSIS — H25043 Posterior subcapsular polar age-related cataract, bilateral: Secondary | ICD-10-CM | POA: Diagnosis not present

## 2021-06-18 DIAGNOSIS — H2513 Age-related nuclear cataract, bilateral: Secondary | ICD-10-CM | POA: Diagnosis not present

## 2021-06-18 DIAGNOSIS — H2512 Age-related nuclear cataract, left eye: Secondary | ICD-10-CM | POA: Diagnosis not present

## 2021-06-28 ENCOUNTER — Ambulatory Visit: Payer: Medicare HMO | Admitting: Cardiology

## 2021-06-28 ENCOUNTER — Encounter: Payer: Self-pay | Admitting: Cardiology

## 2021-06-28 VITALS — BP 128/74 | HR 66 | Ht 62.0 in | Wt 235.6 lb

## 2021-06-28 DIAGNOSIS — I10 Essential (primary) hypertension: Secondary | ICD-10-CM

## 2021-06-28 DIAGNOSIS — I2119 ST elevation (STEMI) myocardial infarction involving other coronary artery of inferior wall: Secondary | ICD-10-CM | POA: Diagnosis not present

## 2021-06-28 DIAGNOSIS — Z9861 Coronary angioplasty status: Secondary | ICD-10-CM | POA: Diagnosis not present

## 2021-06-28 DIAGNOSIS — I2699 Other pulmonary embolism without acute cor pulmonale: Secondary | ICD-10-CM | POA: Diagnosis not present

## 2021-06-28 DIAGNOSIS — E1169 Type 2 diabetes mellitus with other specified complication: Secondary | ICD-10-CM

## 2021-06-28 DIAGNOSIS — I25118 Atherosclerotic heart disease of native coronary artery with other forms of angina pectoris: Secondary | ICD-10-CM

## 2021-06-28 DIAGNOSIS — E785 Hyperlipidemia, unspecified: Secondary | ICD-10-CM | POA: Diagnosis not present

## 2021-06-28 DIAGNOSIS — I739 Peripheral vascular disease, unspecified: Secondary | ICD-10-CM | POA: Diagnosis not present

## 2021-06-28 DIAGNOSIS — I2510 Atherosclerotic heart disease of native coronary artery without angina pectoris: Secondary | ICD-10-CM

## 2021-06-28 DIAGNOSIS — M6289 Other specified disorders of muscle: Secondary | ICD-10-CM | POA: Diagnosis not present

## 2021-06-28 DIAGNOSIS — E1159 Type 2 diabetes mellitus with other circulatory complications: Secondary | ICD-10-CM

## 2021-06-28 NOTE — Patient Instructions (Addendum)
Medication Instructions:  NO CHANGES   *If you need a refill on your cardiac medications before your next appointment, please call your pharmacy*   Lab Work:  NOT NEEDED   Testing/Procedures: WILL BE SCHEDULE AT [facility]  Your physician has requested that you have an ankle brachial index (ABI). During this test an ultrasound and blood pressure cuff are used to evaluate the arteries that supply the arms and legs with blood. Allow thirty minutes for this exam. There are no restrictions or special instructions.  AND Your physician has requested that you have a lower or upper extremity arterial duplex. This test is an ultrasound of the arteries in the legs. It looks at arterial blood flow in the legs. Allow one hour for Lower  Arterial scans. There are no restrictions or special instructions   Follow-Up: At [facility], you and your health needs are our priority.  As part of our continuing mission to provide you with exceptional heart care, we have created designated Provider Care Teams.  These Care Teams include your primary Cardiologist (physician) and Advanced Practice Providers (APPs -  Physician Assistants and Nurse Practitioners) who all work together to provide you with the care you need, when you need it.     Your next appointment:   6 month(s)  The format for your next appointment:   In Person  Provider:   Glenetta Hew, MD

## 2021-06-28 NOTE — Progress Notes (Signed)
Primary Care Provider: Lawerance Cruel, MD Cardiologist: Glenetta Hew, MD Electrophysiologist: None  Clinic Note: Chief Complaint  Patient presents with   Follow-up    73-month  Overall doing better.  Still has leg fatigue and exercise intolerance.   Coronary Artery Disease    No angina   pulmonary embolism    Remains on anticoagulation.    ===================================  ASSESSMENT/PLAN   Problem List Items Addressed This Visit       Cardiology Problems   ST segment elevation myocardial infarction (STEMI) of inferolateral wall, subsequent episode of care ([facility] (Chronic)    She is now 71 and half years out for MI.  EF is normal.  She has had 2 Myoview stress test that were negative to evaluate the existing LAD and circumflex disease.  No further anginal symptoms.  No heart failure symptoms.  She does not have aspirin listed, but apparently she is taking aspirin every other day along with her Eliquis.   She is on high-dose carvedilol and candesartan She is also on stable dose of rosuvastatin with lipids pretty well controlled as of March.       Relevant Orders   EKG 12-Lead (Completed)   CAD S/P percutaneous coronary angioplasty - DES x2 in RCA - Primary (Chronic)    73 1/2 years.  2 stents in the RCA.  No longer on DAPT.  She is on Eliquis and therefore not taking aspirin routinely.  She says she is taking aspirin 81 mg every other day.  Plan: As long as her hemoglobin level stable it is okay to take aspirin 81 mg/day while on Eliquis.  Once she stops Eliquis but may go back to daily dosing. Continue current dose of statin, beta-blocker and ARB along with chest blocker with no active anginal symptoms.      Relevant Orders   EKG 12-Lead (Completed)   Essential hypertension (Chronic)    Blood pressure looks much better today than it has been.  She is now on amlodipine 5 mg daily along with candesartan 32 mg daily and carvedilol 25 mg twice daily.  Could  consider adding HCTZ as a low-dose diuretic and blood pressure adjunct if pressures increase.  She does have edema.       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  Tolerating well.  A1c is 6.7 on metformin and glimepiride.  I think she would probably benefit from GLP-1 agonist plus or minus SGLT2 inhibitor -> GLP-1 agonist may help with weight, and SGLT2 inhibitor may help with edema.  Defer to PCP.       PE (pulmonary thromboembolism) ([place] (Ch[place])    Bilateral PE in October.  She has completed 6 months of therapy with DOAC.  She remains on Eliquis which my understanding was that the dose was reduced to 2.5 mg twice daily for maintenance, but she is still taking 5 which not unreasonable given her body habitus.  With the amount of DVT she had, I think not unreasonable for her to stay on maintenance dose.  We will defer timing and duration of management to oncology/pulmonary medicine.         Other   Severe obesity (BMI >= 40) (HCC) (Chronic)    Her weight  does go up and down quite a bit based on how well her Lap-Band is doing.  When, she never really has lost that much weight with the Lap-Band.  I did think she would probably benefit from a GLP-1 agonist.  She has been followed by pulmonary medicine.  Wondering if they have actually considered OSA evaluation       Relevant Orders   VAS [place] ABI WITH/WO TBI   VAS [place] LOWER EXTREMITY ARTERIAL DUPLEX   Exercise-induced leg fatigue    ABIs were checked last year and seem grossly normal.  We can recheck them now just to make sure there is no significant issues.  Her really limiting factor is her leg fatigue more so than any chest pain pressure or dyspnea.  I recommended continued  exercise. Recheck ABIs and lower extremity Dopplers.       Relevant Orders   VAS [place] ABI WITH/WO TBI   VAS [place] LOWER EXTREMITY ARTERIAL DUPLEX   Other Visit Diagnoses     Coronary artery disease of native artery of native heart with stable angina pectoris (HCC)   (Chronic)     Relevant Orders   EKG 12-Lead (Completed)   Claudication ([place])           ===================================  HPI:    Sandra Beasley is a morbidly obese 71 y.o. female with history of lap band surgery and previous Inferior STEMI (RCA PCI reviewed below) who presents today for 5-73-monthfollow-up at the request of RLawerance Cruel MD.  CAD (Inferior STEMI 2014) => 2 overlapping DES to distal RCA-PDA, medical management of OM1 and LAD lesion),  obesity s/p lap band,  CRFs: HTN, HLD, DM2  I last saw Sandra Gammain March 2022, and she was stable from cardiac standpoint.  She was having some issues with lower extremity swelling and exercise-induced leg fatigue.  We evaluated pressure and venous duplex and ABIs/arterial duplex.  Noted significant ups and downs of weight based on how her left leg is managed.  Recent Hospitalizations:  11/09/2020: Admitted for acute hypoxic respiratory failure not have bilateral PE with bilateral LE popliteal vein DVTs.  Initially admitted to ICU on IV heparin.  No tPA or IR thrombectomy.  PCCM recommended 6 months of DOAC.  Started on Eliquis with plans for 3 to 6 months uninterrupted therapy for what was considered a provoked DVT-PE  She was last seen on 12/29/2020 by CLaurann Montana NP on December 29, 2020 for hospital follow-up follow-up => bilateral LE D[place]  Noted difficulty managing blood pressure since.  PCP had added amlodipine 2.5 mg and increased carvedilol up to 25 mg twice daily. => Echo reviewed.  Noted that follow-up echo ordered by pulmonary medicine for January 2021.  Blood pressure was pretty high at 160s.  No  bleeding issues on Eliquis. => Amlodipine increased to 5 mg daily.  She declined diuretic.  .  Interestingly, oncology recommended reducing Eliquis to down to 2.5 mg twice daily for maintenance, but she remains on 5 mg twice daily.  Will defer to oncology.  Reviewed  CV studies:    The following studies were reviewed today: (if available, images/films reviewed: From Epic Chart or Care Everywhere) Lower Extremity Venous Reflux Dopplers 04/29/2020: No evidence of DVT bilaterally.  No evidence of superior visual venous thrombus.  No evidence of either deep or superficial venous reflux. ABIs 05/06/2020: R ABI 1.27, L ABI 1.26.  R TBI 1.28, L  TBI 1.22.-Normal.  PE protocol CTA 11/10/2022: Extensive bilateral pulmonary artery thrombus within numerous bilateral upper lobe, right middle lobe and bilateral lower lobe branches.  No saddle PE.  Mild cardiomegaly with right-sided heart strain.  Moderate size hiatal hernia with evidence of prior Lap-Band.  Also noted cholelithiasis  Lower Extremity Venous Dopplers 11/10/2020: Bilateral posterior tibial DVTs.  Echo 11/10/2020: (In setting of PE) EF 55 to 60%.  No RWMA.  GR 1 DD.  IVS flattening in systole and diastole consistent with RV pressure volume overload.  Severely reduced RV function with mildly elevated PAP roughly 57 mmHg.  Normal atrial sizes, I-S bowing to the left suggesting high RAP.  McConnell sign also noted suggesting a pulmonary embolus.  02/23/2021: EF 65 to 70%.  No RWMA.  GR 1 DD with moderate LA dilation.  Unable to assess PAP.  Normal aortic and mitral valves.  Ascending Aorta measured at 37 mmHg.  Comparison(s): 11/10/20 EF 55-60%. PA pressure 49mHg  Interval History:   Sandra HEBERTtoday overall doing pretty well.  She says she is doing overall a lot better.  [facility] gets short of breath has to stop.  When asked more detail, it seems actually that her leg fatigue is bothering  her more than dyspnea. She does note that his leg fatigue not overall energy level fatigue.  She did get a little bit out of shape when she was recovering from her PE and has not yet gotten back to the level that she was able to do.  She will get short of breath if he overexerts.  She denies any chest pain or pressure with rest or exertion.  She does have lower extremity swelling, but no more than usual.  No real PND or orthopnea.  She denies any rapid heartbeats palpitations.  No syncope/near syncope or TIA/amaurosis fugax, claudication.  Her blood pressures have been a lot better controlled.  REVIEWED OF SYSTEMS   Review of Systems  Constitutional:  Positive for malaise/fatigue (More leg fatigue than generalized fatigue.). Negative for weight loss.  HENT:  Negative for congestion and nosebleeds.   Respiratory:  Positive for shortness of breath (With overexertion). Negative for cough.   Cardiovascular:  Positive for claudication (Leg fatigue with walking.) and leg swelling.  Gastrointestinal:  Negative for blood in stool and melena.  Genitourinary:  Negative for hematuria.  Musculoskeletal:  Positive for joint pain.  Neurological:  Negative for dizziness, focal weakness and weakness.  Psychiatric/Behavioral: Negative.     I have reviewed and (if needed) personally updated the patient's problem list, medications, allergies, past medical and surgical history, social and family history.   PAST MEDICAL HISTORY   Past Medical History:  Diagnosis Date   Adenomatous polyp of colon 09/2005   CAD S/P percutaneous coronary angioplasty - DES x2 in RCA 01/02/2013; 02/2013   a. 100% RPDA - Xience Xpedition DES 2.25 mm x 15 mm + 2.25 mm x 8 mm overlapping, mod- severe distal LAD and prox OM1 lesions.;b.  Myoview 02/2013 & Jan 2020: No ischemia or Infarction, Normal EF.   GERD (gastroesophageal reflux disease) 03/06/2013   Hyperlipidemia associated with type 2 diabetes mellitus ([place]    Hypertension     Obesity    STEMI [place]ST elevation myocardial infarction) ([place] 01/02/13   --> PCI; EF 60 and 65%. Gr 1 DD & No regional WMA, Otherwise relatively normal.   Type 2 diabetes mellitus  with circulatory disorder ([place])    Vitamin D deficiency     PAST SURGICAL HISTORY   Past Surgical History:  Procedure Laterality Date   ABDOMINAL HYSTERECTOMY     CORONARY ANGIOPLASTY WITH STENT PLACEMENT  01/02/2013    PCI-RCA: Xience Xpedition 2.25 mm x 15 mm, 2.25 mm x 8 mm (2.4 mm)   LAPAROSCOPIC GASTRIC BANDING  2006   LAPAROSCOPIC HYSTERECTOMY  1996   LEFT HEART CATHETERIZATION WITH CORONARY ANGIOGRAM N/A 01/02/2013   Procedure: LEFT HEART CATHETERIZATION WITH CORONARY ANGIOGRAM;  Surgeon: Leonie Man, MD;  Location: [facility] C[facility];: Inferior STEMI: 100% PDA; mid-distal LAD beyond D1 (small caliber) 80%.  Ostial D1 40%.  Lateral OM 1 with 70-80% stenosis.  Large branching ramus intermedius with OM and DIAG branch branches.   NM MYOVIEW LTD  01/'15; 1/'20   a) EF 60-65%.  Normal wall motion.  No ischemia or infarction.; b) EF 70 to 75%.  No EKG changes.  No evidence of ischemia or infarction.:   TONSILLECTOMY  1966   TRANSTHORACIC ECHOCARDIOGRAM  01/04/2013   (Post inferior STEMI) EF 60 and 65%. Grade 1 diastolic dysfunction with no regional wall motion abnormalities. Otherwise relatively normal.    Immunization History  Administered Date(s) Administered   PFIZER(Purple Top)SARS-COV-2 Vaccination 03/16/2019, 04/10/2019    MEDICATIONS/ALLERGIES   Current Meds  Medication Sig   amLODipine (NORVASC) 5 MG tablet Take 1 tablet (5 mg total) by mouth at bedtime.   apixaban (ELIQUIS) 5 MG TABS tablet Please continue with 2 tablets (10 mg) by mouth twice daily. On 11/19/2020 transition to 1 tablet (5 mg) twice daily.   candesartan (ATACAND) 32 MG tablet Take 32 mg by mouth daily.   carvedilol (COREG) 25 MG tablet Take 25 mg by mouth 2 (two) times daily.   Cholecalciferol 1000 UNITS TBDP Take 1,000 Units  by mouth every morning.    folic acid (FOLVITE) 1 MG tablet Take 1 tablet (1 mg total) by mouth daily.   glimepiride (AMARYL) 4 MG tablet Take 4 mg by mouth daily before breakfast.   metFORMIN (GLUCOPHAGE) 500 MG tablet Take 1,000 mg by mouth 2 (two) times daily with a meal.   metroNIDAZOLE (METROCREAM) 0.75 % cream Apply 1 application topically 2 (two) times daily as needed (rosacea). Apply to face   nitroGLYCERIN (NITROSTAT) 0.4 MG SL tablet Place 1 tablet (0.4 mg total) under the tongue every 5 (five) minutes as needed for chest pain.   pantoprazole (PROTONIX) 40 MG tablet Take 1 tablet (40 mg total) by mouth daily.   rosuvastatin (CRESTOR) 10 MG tablet Take 10 mg by mouth daily.   triamcinolone cream (KENALOG) 0.1 % Apply 1 application topically 2 (two) times daily as needed (rash/irritation).  She says she is taking aspirin 81 mg every other day.  Allergies  Allergen Reactions   Ace Inhibitors Cough   Other Cough    Spices make pt cough, sneeze, eyes water   Statins     Can take Crestor    SOCIAL HISTORY/FAMILY HISTORY   Reviewed in Epic:  Pertinent findings:  Social History   Tobacco Use   Smoking status: Never   Smokeless tobacco: Never  Substance Use Topics   Alcohol use: No   Drug use: No   Social History   Social History Narrative   She is a divorced nonsmoker who works for [employer]. She is currently now in cardiac rehabilitation. Has changed her dietary habits to a Vegan lifestyle.    OBJCTIVE -  PE, EKG, labs   Wt Readings from Last 3 Encounters:  06/28/21 235 lb 9.6 oz (106.9 kg)  05/17/21 235 lb 8 oz (106.8 kg)  12/29/20 225 lb (102.1 kg)    Physical Exam: BP 128/74   Pulse 66   Ht '5\' 2"'$  (1.575 m)   Wt 235 lb 9.6 oz (106.9 kg)   SpO2 97%   BMI 43.09 kg/m  Physical Exam Vitals reviewed.  Constitutional:      General: She is not in acute distress.    Appearance: She is obese. She is not toxic-appearing or diaphoretic.     Comments:  Morbidly obese.  Well-groomed.  Otherwise healthy-appearing.  HENT:     Head: Normocephalic and atraumatic.  Neck:     Vascular: No carotid bruit or JVD.  Cardiovascular:     Rate and Rhythm: Normal rate and regular rhythm. Occasional Extrasystoles are present.    Chest Wall: PMI is not displaced (Unable to palpate).     Pulses: Decreased pulses (Decreased, but palpable).     Heart sounds: S1 normal and S2 normal. Heart sounds are distant. No murmur heard.   No friction rub. No gallop.  Pulmonary:     Effort: Pulmonary effort is normal. No respiratory distress.     Breath sounds: Normal breath sounds. No wheezing, rhonchi or rales.  Chest:     Chest wall: No tenderness.  Musculoskeletal:        General: Swelling (Trivial 1+ bilateral edema, nonpitting) present.     Cervical back: Normal range of motion and neck supple.  Skin:    General: Skin is warm and dry.     Coloration: Skin is not pale.  Neurological:     General: No focal deficit present.     Mental Status: She is alert and oriented to person, place, and time. Mental status is at baseline.     Gait: Gait abnormal.  Psychiatric:        Mood and Affect: Mood normal.        Behavior: Behavior normal.        Thought Content: Thought content normal.        Judgment: Judgment normal.    Adult ECG Report  Rate: 66;  Rhythm: normal sinus rhythm and incomplete right bundle branch block, nonspecific ST-T wave changes. ; Otherwise normal axis, intervals and durations.  Narrative Interpretation: Reviewed stable  Recent Labs:   04/26/2021: TC 140, TG 136, HDL 60, LDL 53.  A1c 6.7; Lab Results  Component Value Date   CHOL 151 05/06/2020   HDL 73 05/06/2020   LDLCALC 64 05/06/2020   TRIG 75 05/06/2020   CHOLHDL 2.1 05/06/2020   Lab Results  Component Value Date   CREATININE 1.32 (H) 05/17/2021   BUN 21 05/17/2021   NA 140 05/17/2021   K 3.9 05/17/2021   CL 110 05/17/2021   CO2 22 05/17/2021      Latest Ref Rng & Units  05/17/2021    3:52 PM 11/11/2020    2:17 AM 11/10/2020    5:16 AM  CBC  WBC 4.0 - 10.5 K/uL 6.0   10.9   9.0    Hemoglobin 12.0 - 15.0 g/dL 8.9   10.2   10.2    Hematocrit 36.0 - 46.0 % 30.8   34.8   34.0    Platelets 150 - 400 K/uL 203   213   210      Lab Results  Component Value Date   HGBA1C  7.0 (H) 11/10/2020   Lab Results  Component Value Date   TSH 1.490 12/29/2020    ==================================================  COVID-19 Education: The signs and symptoms of COVID-19 were discussed with the patient and how to seek care for testing (follow up with PCP or arrange E-visit).    I spent a total of 40 minutes with the patient spent in direct patient consultation.  Additional time spent with chart review  / charting (studies, outside notes, etc): 18 min Total Time: 58 min  Current medicines are reviewed at length with the patient today.  (+/- concerns) n/a  This visit occurred during the SARS-CoV-2 public health emergency.  Safety protocols were in place, including screening questions prior to the visit, additional usage of staff PPE, and extensive cleaning of exam room while observing appropriate contact time as indicated for disinfecting solutions.  Notice: This dictation was prepared with Dragon dictation along with smart phrase technology. Any transcriptional errors that result from this process are unintentional and may not be corrected upon review.  Studies Ordered:   Orders Placed This Encounter  Procedures   EKG 12-Lead   VAS [place] ABI WITH/WO TBI   VAS [place] LOWER EXTREMITY ARTERIAL DUPLEX   No orders of the defined types were placed in this encounter.   Patient Instructions / Medication Changes & Studies & Tests Ordered   Patient Instructions  Medication Instructions:  NO CHANGES   *If you need a refill on your cardiac medications before your next appointment, please call your pharmacy*   Lab Work:  NOT NEEDED   Testing/Procedures: WILL BE SCHEDULE  AT [place] [address]  Your physician has requested that you have an ankle brachial index (ABI). During this test an ultrasound and blood pressure cuff are used to evaluate the arteries that supply the arms and legs with blood. Allow thirty minutes for this exam. There are no restrictions or special instructions.  AND Your physician has requested that you have a lower or upper extremity arterial duplex. This test is an ultrasound of the arteries in the legs. It looks at arterial blood flow in the legs. Allow one hour for Lower  Arterial scans. There are no restrictions or special instructions   Follow-Up: At [facility], you and your health needs are our priority.  As part of our continuing mission to provide you with exceptional heart care, we have created designated Provider Care Teams.  These Care Teams include your primary Cardiologist (physician) and Advanced Practice Providers (APPs -  Physician Assistants and Nurse Practitioners) who all work together to provide you with the care you need, when you need it.     Your next appointment:   6 month(s)  The format for your next appointment:   In Person  Provider:   Glenetta Hew, MD       Glenetta Hew, M.D., M.S. Interventional Cardiologist   Pager # [phone] Phone # [phone] [address] [place], [place] [address]   Thank you for choosing Heartcare at [facility]!!

## 2021-07-05 ENCOUNTER — Encounter: Payer: Self-pay | Admitting: Cardiology

## 2021-07-05 NOTE — Assessment & Plan Note (Addendum)
She is now 8 and half years out for MI.  EF is normal.  She has had 2 Myoview stress test that were negative to evaluate the existing LAD and circumflex disease.  No further anginal symptoms.  No heart failure symptoms.  She does not have aspirin listed, but apparently she is taking aspirin every other day along with her Eliquis.   She is on high-dose carvedilol and candesartan She is also on stable dose of rosuvastatin with lipids pretty well controlled as of March.

## 2021-07-05 NOTE — Assessment & Plan Note (Signed)
Bilateral PE in October.  She has completed 6 months of therapy with DOAC.  She remains on Eliquis which my understanding was that the dose was reduced to 2.5 mg twice daily for maintenance, but she is still taking 5 which not unreasonable given her body habitus.  With the amount of DVT she had, I think not unreasonable for her to stay on maintenance dose.  We will defer timing and duration of management to oncology/pulmonary medicine.

## 2021-07-05 NOTE — Assessment & Plan Note (Signed)
Lipids look great on current dose of rosuvastatin.  Tolerating well.  A1c is 6.7 on metformin and glimepiride.  I think she would probably benefit from GLP-1 agonist plus or minus SGLT2 inhibitor -> GLP-1 agonist may help with weight, and SGLT2 inhibitor may help with edema.  Defer to PCP.

## 2021-07-05 NOTE — Assessment & Plan Note (Addendum)
ABIs were checked last year and seem grossly normal.  We can recheck them now just to make sure there is no significant issues.  Her really limiting factor is her leg fatigue more so than any chest pain pressure or dyspnea.  I recommended continued exercise. Recheck ABIs and lower extremity Dopplers.

## 2021-07-05 NOTE — Assessment & Plan Note (Signed)
Blood pressure looks much better today than it has been.  She is now on amlodipine 5 mg daily along with candesartan 32 mg daily and carvedilol 25 mg twice daily.  Could consider adding HCTZ as a low-dose diuretic and blood pressure adjunct if pressures increase.  She does have edema.

## 2021-07-05 NOTE — Assessment & Plan Note (Signed)
Her weight does go up and down quite a bit based on how well her Lap-Band is doing.  When, she never really has lost that much weight with the Lap-Band.  I did think she would probably benefit from a GLP-1 agonist.  She has been followed by pulmonary medicine.  Wondering if they have actually considered OSA evaluation

## 2021-07-05 NOTE — Assessment & Plan Note (Signed)
8 1/2 years.  2 stents in the RCA.  No longer on DAPT.  She is on Eliquis and therefore not taking aspirin routinely.  She says she is taking aspirin 81 mg every other day.  Plan:  As long as her hemoglobin level stable it is okay to take aspirin 81 mg/day while on Eliquis.  Once she stops Eliquis but may go back to daily dosing.  Continue current dose of statin, beta-blocker and ARB along with chest blocker with no active anginal symptoms.

## 2021-07-16 DIAGNOSIS — E538 Deficiency of other specified B group vitamins: Secondary | ICD-10-CM | POA: Diagnosis not present

## 2021-07-20 ENCOUNTER — Ambulatory Visit (HOSPITAL_COMMUNITY)
Admission: RE | Admit: 2021-07-20 | Discharge: 2021-07-20 | Disposition: A | Discharge status: Home / Self Care | Payer: Medicare HMO | Source: Ambulatory Visit | Attending: Cardiology | Admitting: Cardiology

## 2021-07-20 DIAGNOSIS — E1159 Type 2 diabetes mellitus with other circulatory complications: Secondary | ICD-10-CM | POA: Insufficient documentation

## 2021-07-20 DIAGNOSIS — M6289 Other specified disorders of muscle: Secondary | ICD-10-CM | POA: Insufficient documentation

## 2021-08-03 ENCOUNTER — Ambulatory Visit (INDEPENDENT_AMBULATORY_CARE_PROVIDER_SITE_OTHER): Payer: Medicare HMO | Admitting: Podiatry

## 2021-08-03 DIAGNOSIS — L84 Corns and callosities: Secondary | ICD-10-CM | POA: Diagnosis not present

## 2021-08-03 DIAGNOSIS — Z7901 Long term (current) use of anticoagulants: Secondary | ICD-10-CM

## 2021-08-03 DIAGNOSIS — E1159 Type 2 diabetes mellitus with other circulatory complications: Secondary | ICD-10-CM | POA: Diagnosis not present

## 2021-08-07 ENCOUNTER — Encounter: Payer: Self-pay | Admitting: Podiatry

## 2021-08-07 NOTE — Progress Notes (Signed)
  Subjective:  Patient ID: Sandra Beasley, female    DOB: September 14, 1950,  MRN: [id number]  Painful thickened elongated nails, calluses returned as well  71 y.o. female returns for follow-up with the above complaint. History confirmed with patient.  Corners of the great toenails occasionally cause discomfort, denies any signs of infection.  The calluses are also thick again.  She still taking Eliquis for her DVT  Objective:  Physical Exam: warm, good capillary refill, no trophic changes or ulcerative lesions, normal DP and PT pulses, normal monofilament exam, normal sensory exam and onychomycosis.  Pinch callus bilateral hallux.  Nails elongated thickened with some subungual debris Assessment:   1. Callus of foot   2. Chronic anticoagulation        Plan:  Patient was evaluated and treated and all questions answered.  Discussed ingrowing nails and partial permanent matricectomy.  She will return to see me for this if they become bothersome.  All symptomatic hyperkeratoses were safely debrided with a sterile #15 blade to patient's level of comfort without incident. We discussed preventative and palliative care of these lesions including supportive and accommodative shoegear, padding, prefabricated and custom molded accommodative orthoses, use of a pumice stone and lotions/creams daily.    No follow-ups on file.

## 2021-08-13 DIAGNOSIS — E538 Deficiency of other specified B group vitamins: Secondary | ICD-10-CM | POA: Diagnosis not present

## 2021-08-17 ENCOUNTER — Other Ambulatory Visit: Payer: Self-pay | Admitting: Physician Assistant

## 2021-08-17 DIAGNOSIS — D509 Iron deficiency anemia, unspecified: Secondary | ICD-10-CM

## 2021-08-18 ENCOUNTER — Other Ambulatory Visit: Payer: Self-pay

## 2021-08-18 ENCOUNTER — Inpatient Hospital Stay: Payer: Medicare HMO | Attending: Physician Assistant

## 2021-08-18 ENCOUNTER — Inpatient Hospital Stay: Payer: Medicare HMO | Admitting: Physician Assistant

## 2021-08-18 VITALS — BP 115/98 | HR 66 | Temp 97.7°F | Resp 18 | Wt 232.4 lb

## 2021-08-18 DIAGNOSIS — D509 Iron deficiency anemia, unspecified: Secondary | ICD-10-CM | POA: Diagnosis not present

## 2021-08-18 DIAGNOSIS — Z86711 Personal history of pulmonary embolism: Secondary | ICD-10-CM | POA: Diagnosis not present

## 2021-08-18 DIAGNOSIS — Z90710 Acquired absence of both cervix and uterus: Secondary | ICD-10-CM | POA: Insufficient documentation

## 2021-08-18 DIAGNOSIS — E538 Deficiency of other specified B group vitamins: Secondary | ICD-10-CM | POA: Insufficient documentation

## 2021-08-18 DIAGNOSIS — Z803 Family history of malignant neoplasm of breast: Secondary | ICD-10-CM | POA: Insufficient documentation

## 2021-08-18 DIAGNOSIS — Z7901 Long term (current) use of anticoagulants: Secondary | ICD-10-CM | POA: Insufficient documentation

## 2021-08-18 DIAGNOSIS — D508 Other iron deficiency anemias: Secondary | ICD-10-CM

## 2021-08-18 DIAGNOSIS — Z86718 Personal history of other venous thrombosis and embolism: Secondary | ICD-10-CM | POA: Diagnosis not present

## 2021-08-18 DIAGNOSIS — I10 Essential (primary) hypertension: Secondary | ICD-10-CM | POA: Insufficient documentation

## 2021-08-18 DIAGNOSIS — D649 Anemia, unspecified: Secondary | ICD-10-CM | POA: Insufficient documentation

## 2021-08-18 DIAGNOSIS — E1169 Type 2 diabetes mellitus with other specified complication: Secondary | ICD-10-CM | POA: Diagnosis not present

## 2021-08-18 LAB — CMP (CANCER CENTER ONLY)
ALT: 7 U/L (ref 0–44)
AST: 8 U/L — ABNORMAL LOW (ref 15–41)
Albumin: 3.9 g/dL (ref 3.5–5.0)
Alkaline Phosphatase: 48 U/L (ref 38–126)
Anion gap: 9 (ref 5–15)
BUN: 25 mg/dL — ABNORMAL HIGH (ref 8–23)
CO2: 26 mmol/L (ref 22–32)
Calcium: 9.2 mg/dL (ref 8.9–10.3)
Chloride: 104 mmol/L (ref 98–111)
Creatinine: 1.6 mg/dL — ABNORMAL HIGH (ref 0.44–1.00)
GFR, Estimated: 34 mL/min — ABNORMAL LOW (ref >60)
Glucose, Bld: 138 mg/dL — ABNORMAL HIGH (ref 70–99)
Potassium: 3.6 mmol/L (ref 3.5–5.1)
Sodium: 139 mmol/L (ref 135–145)
Total Bilirubin: 0.8 mg/dL (ref 0.3–1.2)
Total Protein: 7 g/dL (ref 6.5–8.1)

## 2021-08-18 LAB — IRON AND IRON BINDING CAPACITY (CC-WL,HP ONLY)
Iron: 37 ug/dL (ref 28–170)
Saturation Ratios: 9 % — ABNORMAL LOW (ref 10.4–31.8)
TIBC: 431 ug/dL (ref 250–450)
UIBC: 394 ug/dL (ref 148–442)

## 2021-08-18 LAB — FERRITIN: Ferritin: 3 ng/mL — ABNORMAL LOW (ref 11–307)

## 2021-08-18 LAB — RETIC PANEL
Immature Retic Fract: 21.9 % — ABNORMAL HIGH (ref 2.3–15.9)
RBC.: 3.98 MIL/uL (ref 3.87–5.11)
Retic Count, Absolute: 66.1 10*3/uL (ref 19.0–186.0)
Retic Ct Pct: 1.7 % (ref 0.4–3.1)
Reticulocyte Hemoglobin: 23.3 pg — ABNORMAL LOW (ref >27.9)

## 2021-08-18 LAB — CBC WITH DIFFERENTIAL (CANCER CENTER ONLY)
Abs Immature Granulocytes: 0.01 10*3/uL (ref 0.00–0.07)
Basophils Absolute: 0.1 10*3/uL (ref 0.0–0.1)
Basophils Relative: 1 %
Eosinophils Absolute: 0.2 10*3/uL (ref 0.0–0.5)
Eosinophils Relative: 3 %
HCT: 29.5 % — ABNORMAL LOW (ref 36.0–46.0)
Hemoglobin: 8.6 g/dL — ABNORMAL LOW (ref 12.0–15.0)
Immature Granulocytes: 0 %
Lymphocytes Relative: 24 %
Lymphs Abs: 1.3 10*3/uL (ref 0.7–4.0)
MCH: 21.5 pg — ABNORMAL LOW (ref 26.0–34.0)
MCHC: 29.2 g/dL — ABNORMAL LOW (ref 30.0–36.0)
MCV: 73.8 fL — ABNORMAL LOW (ref 80.0–100.0)
Monocytes Absolute: 0.5 10*3/uL (ref 0.1–1.0)
Monocytes Relative: 9 %
Neutro Abs: 3.5 10*3/uL (ref 1.7–7.7)
Neutrophils Relative %: 63 %
Platelet Count: 207 10*3/uL (ref 150–400)
RBC: 4 MIL/uL (ref 3.87–5.11)
RDW: 17.8 % — ABNORMAL HIGH (ref 11.5–15.5)
WBC Count: 5.6 10*3/uL (ref 4.0–10.5)
nRBC: 0 % (ref 0.0–0.2)

## 2021-08-18 LAB — FOLATE: Folate: 26.6 ng/mL (ref >5.9)

## 2021-08-18 LAB — VITAMIN B12: Vitamin B-12: 792 pg/mL (ref 180–914)

## 2021-08-18 NOTE — Progress Notes (Signed)
[place] [phone]   [phone]  PROGRESS NOTE  Patient Care Team: Lawerance Cruel, MD as PCP - General (Family Medicine) Leonie Man, MD as PCP - Cardiology (Cardiology)  Hematological/Oncological History -11/09/2020-11/13/2020: Admitted due to progressive dyspnea.  CT angiogram of the chest showed extensive bilateral pulmonary emboli with associated right heart strain.  Doppler ultrasound revealed acute DVT involving the left posterior tibial veins, left peroneal veins and right posterior tibial veins.  Patient was started on IV heparin drip and transition to Eliquis.    -05/17/2021: Establish care with Sandra Query Sandra Beasley  CHIEF COMPLAINTS/PURPOSE OF CONSULTATION:  -H/O bilateral DVT and pulmonary emboli -Iron deficiency anemia/folate deficiency/vitamin B12 deficiency  HISTORY OF PRESENTING ILLNESS:  Sandra Beasley 71 y.o. female returns for a follow up for iron, B12 and folate deficiency. She is unaccompanied for this visit.   On exam today,Sandra Beasley reports persistent fatigue and frequent dizzy spells. She can complete her daily activities on her own but requires resting. She denies any appetite changes. She denies nausea, vomiting or abdominal pain. Her bowel habits are unchanged without any recurrent episodes of diarrhea or constipation. She denies easy bruising or signs of active bleeding. She notes having shortness of breath with exertion. She denies fevers, chills, sweats,chest pain or cough.  Rest of the 10 point ROS is below  MEDICAL HISTORY:  Past Medical History:  Diagnosis Date   Adenomatous polyp of colon 09/2005   CAD S/P percutaneous coronary angioplasty - DES x2 in RCA 01/02/2013; 02/2013   a. 100% RPDA - Xience Xpedition DES 2.25 mm x 15 mm + 2.25 mm x 8 mm overlapping, mod- severe distal LAD and prox OM1 lesions.;b.  Myoview 02/2013 & Jan 2020: No ischemia or Infarction, Normal EF.   GERD (gastroesophageal reflux  disease) 03/06/2013   Hyperlipidemia associated with type 2 diabetes mellitus ([place])    Hypertension    Obesity    STEMI (ST elevation myocardial infarction) ([place]) 01/02/13   --> PCI; EF 60 and 65%. Gr 1 DD & No regional WMA, Otherwise relatively normal.   Type 2 diabetes mellitus with circulatory disorder (HCC)    Vitamin D deficiency     SURGICAL HISTORY: Past Surgical History:  Procedure Laterality Date   ABDOMINAL HYSTERECTOMY     CORONARY ANGIOPLASTY WITH STENT PLACEMENT  01/02/2013    PCI-RCA: Xience Xpedition 2.25 mm x 15 mm, 2.25 mm x 8 mm (2.4 mm)   LAPAROSCOPIC GASTRIC BANDING  2006   LAPAROSCOPIC HYSTERECTOMY  1996   LEFT HEART CATHETERIZATION WITH CORONARY ANGIOGRAM N/A 01/02/2013   Procedure: LEFT HEART CATHETERIZATION WITH CORONARY ANGIOGRAM;  Surgeon: Leonie Man, MD;  Location: [facility] CATH LAB;: Inferior STEMI: 100% PDA; mid-distal LAD beyond D1 (small caliber) 80%.  Ostial D1 40%.  Lateral OM 1 with 70-80% stenosis.  Large branching ramus intermedius with OM and DIAG branch branches.   NM MYOVIEW LTD  01/'15; 1/'20   a) EF 60-65%.  Normal wall motion.  No ischemia or infarction.; b) EF 70 to 75%.  No EKG changes.  No evidence of ischemia or infarction.:   TONSILLECTOMY  1966   TRANSTHORACIC ECHOCARDIOGRAM  01/04/2013   (Post inferior STEMI) EF 60 and 65%. Grade 1 diastolic dysfunction with no regional wall motion abnormalities. Otherwise relatively normal.    SOCIAL HISTORY: Social History   Socioeconomic History   Marital status: Divorced    Spouse name: Not on file   Number of children: 3  Years of education: Not on file   Highest education level: Not on file  Occupational History   Occupation: Teacher  Tobacco Use   Smoking status: Never   Smokeless tobacco: Never  Substance and Sexual Activity   Alcohol use: No   Drug use: No   Sexual activity: Not on file  Other Topics Concern   Not on file  Social History Narrative   She is a divorced nonsmoker  who works for [employer]. She is currently now in cardiac rehabilitation. Has changed her dietary habits to a Vegan lifestyle.   Social Determinants of Health   Financial Resource Strain: Not on file  Food Insecurity: Not on file  Transportation Needs: Not on file  Physical Activity: Not on file  Stress: Not on file  Social Connections: Not on file  Intimate Partner Violence: Not on file    FAMILY HISTORY: Family History  Adopted: Yes  Problem Relation Age of Onset   Breast cancer Maternal Aunt    Diabetes Maternal Aunt    Heart disease Maternal Aunt    Diabetes Maternal Uncle    Diabetes gravidarum Maternal Uncle    Colon cancer Neg Hx    Stomach cancer Neg Hx    Rectal cancer Neg Hx     ALLERGIES:  is allergic to ace inhibitors, actos [pioglitazone], other, and statins.  MEDICATIONS:  Current Outpatient Medications  Medication Sig Dispense Refill   apixaban (ELIQUIS) 5 MG TABS tablet Please continue with 2 tablets (10 mg) by mouth twice daily. On 11/19/2020 transition to 1 tablet (5 mg) twice daily. 70 tablet 0   candesartan (ATACAND) 32 MG tablet Take 32 mg by mouth daily.     carvedilol (COREG) 25 MG tablet Take 25 mg by mouth 2 (two) times daily.     Cholecalciferol 1000 UNITS TBDP Take 1,000 Units by mouth every morning.      cyanocobalamin (,VITAMIN B-12,) 1000 MCG/ML injection 1 mL     folic acid (FOLVITE) 1 MG tablet Take 1 tablet (1 mg total) by mouth daily. 30 tablet 6   glimepiride (AMARYL) 4 MG tablet Take 4 mg by mouth daily before breakfast.     metFORMIN (GLUCOPHAGE) 500 MG tablet Take 1,000 mg by mouth 2 (two) times daily with a meal.     metroNIDAZOLE (METROCREAM) 0.75 % cream Apply 1 application topically 2 (two) times daily as needed (rosacea). Apply to face     nitroGLYCERIN (NITROSTAT) 0.4 MG SL tablet Place 1 tablet (0.4 mg total) under the tongue every 5 (five) minutes as needed for chest pain. 25 tablet 1   pantoprazole (PROTONIX) 40 MG tablet  Take 1 tablet (40 mg total) by mouth daily. 30 tablet 1   rosuvastatin (CRESTOR) 10 MG tablet Take 10 mg by mouth daily.  4   triamcinolone cream (KENALOG) 0.1 % Apply 1 application topically 2 (two) times daily as needed (rash/irritation).     amLODipine (NORVASC) 5 MG tablet Take 1 tablet (5 mg total) by mouth at bedtime. 30 tablet 5   No current facility-administered medications for this visit.    REVIEW OF SYSTEMS:   Constitutional: ( - ) fevers, ( - )  chills , ( - ) night sweats Eyes: ( - ) blurriness of vision, ( - ) double vision, ( - ) watery eyes Ears, nose, mouth, throat, and face: ( - ) mucositis, ( - ) sore throat Respiratory: ( - ) cough, ( - ) dyspnea, ( - ) wheezes  Cardiovascular: ( - ) palpitation, ( - ) chest discomfort, ( - ) lower extremity swelling Gastrointestinal:  ( - ) nausea, ( - ) heartburn, ( - ) change in bowel habits Skin: ( - ) abnormal skin rashes Lymphatics: ( - ) new lymphadenopathy, ( - ) easy bruising Neurological: ( - ) numbness, ( - ) tingling, ( - ) new weaknesses Behavioral/Psych: ( - ) mood change, ( - ) new changes  All other systems were reviewed with the patient and are negative.  PHYSICAL EXAMINATION: ECOG PERFORMANCE STATUS: 1 - Symptomatic but completely ambulatory  Vitals:   08/18/21 0938  BP: (!) 115/98  Pulse: 66  Resp: 18  Temp: 97.7 F (36.5 C)  SpO2: 98%   Filed Weights   08/18/21 0938  Weight: 232 lb 6.4 oz (105.4 kg)    GENERAL: well appearing female in NAD  SKIN: skin color, texture, turgor are normal, no rashes or significant lesions EYES: conjunctiva are pink and non-injected, sclera clear OROPHARYNX: no exudate, no erythema; lips, buccal mucosa, and tongue normal  LUNGS: clear to auscultation and percussion with normal breathing effort HEART: regular rate & rhythm and no murmurs and no lower extremity edema Musculoskeletal: no cyanosis of digits and no clubbing  PSYCH: alert & oriented x 3, fluent speech NEURO:  no focal motor/sensory deficits  LABORATORY DATA:  I have reviewed the data as listed    Latest Ref Rng & Units 08/18/2021    9:22 AM 05/17/2021    3:52 PM 11/11/2020    2:17 AM  CBC  WBC 4.0 - 10.5 K/uL 5.6  6.0  10.9   Hemoglobin 12.0 - 15.0 g/dL 8.6  8.9  10.2   Hematocrit 36.0 - 46.0 % 29.5  30.8  34.8   Platelets 150 - 400 K/uL 207  203  213        Latest Ref Rng & Units 08/18/2021    9:22 AM 05/17/2021    3:52 PM 12/29/2020    3:58 PM  CMP  Glucose 70 - 99 mg/dL 138  207  184   BUN 8 - 23 mg/dL '25  21  18   '$ Creatinine 0.44 - 1.00 mg/dL 1.60  1.32  1.22   Sodium 135 - 145 mmol/L 139  140  140   Potassium 3.5 - 5.1 mmol/L 3.6  3.9  4.1   Chloride 98 - 111 mmol/L 104  110  106   CO2 22 - 32 mmol/L '26  22  20   '$ Calcium 8.9 - 10.3 mg/dL 9.2  9.0  9.4   Total Protein 6.5 - 8.1 g/dL 7.0  6.9    Total Bilirubin 0.3 - 1.2 mg/dL 0.8  0.5    Alkaline Phos 38 - 126 U/L 48  59    AST 15 - 41 U/L 8  8    ALT 0 - 44 U/L 7  7      [employer] WE[employer]S GERDEMAN is a 71 y.o. female returns for a follow up for history of DVT/PE and nutritional deficiencies.    #H/O bilateral lower extremity DVT and bilateral pulmonary emboli with right heart strain: --Diagnosed in October 2022 with CTA that showed extensive bilateral pulmonary emboli with associated right heart strain. Doppler US showed acute DVT involving the left posterior tibial veins, left peroneal veins and right posterior tibial veins. --Currently on Eliquis 5 mg PO twice daily, tolerating well without any toxicities. Okay to transition to maintenance dose of 2.5  mg twice daily.  --Since there is no definitive provoking factor and due to the extensive thrombotic presentation, the recommendation is indefinite anticoagulation.  --Labs from 05/17/2021 ruled our antiphospholipid syndrome.   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  --Labs today show that folate and B12 deficiencies have resolved with folate 26.6 ng/mL, vitamin B12 792. Recommend to continue both supplementation. There continues to be persistent iron deficiency anemia with Hgb 8.6, MCV 73.8, iron saturation 9%, ferritin 3. I emphasized that oral iron will unlikely be absorbed due to history of bariatric surgery. Recommend IV iron but patient has declined at this time. She is fearful of potential infusion reactions. I reviewed the potential side effects from IV iron and reviewed that infusion reactions are rare. I added that her ongoing symptoms of fatigue, shortness of breath and dizziness are most likely due to her persistent anemia. Patient expressed understanding but would like to pursue PO iron at this time. I recommend to closely monitor levels and repeat in 4 weeks. If there is persistent anemia at that time, we will re-discuss IV iron infusions.   No orders of the defined types were placed in this encounter.   All questions were answered. The patient knows to call the clinic with any problems, questions or concerns.  I have spent a total of 30 minutes minutes of face-to-face and non-face-to-face time, preparing to see the patient, performing a medically appropriate examination, counseling and educating the patient, ordering tests, documenting clinical information in the electronic health record, and care coordination.   Sandra Query, Sandra Beasley Department of Hematology/Oncology [place] [facility] Phone: [phone]

## 2021-08-19 ENCOUNTER — Telehealth: Payer: Self-pay | Admitting: Physician Assistant

## 2021-08-19 ENCOUNTER — Telehealth: Payer: Self-pay

## 2021-08-19 NOTE — Telephone Encounter (Signed)
Pt advised via My Chart message and T/C.

## 2021-08-19 NOTE — Telephone Encounter (Signed)
-----   Message from Lincoln Brigham, PA-C sent at 08/18/2021  9:38 PM EDT ----- Please notify patient that folate and vitamin B12 deficiencies have resolved, both are in normal limits. I advise to continue folic acid pills and vitamin B12 injections. She continues to have persistent iron deficiency anemia. As discussed in visit,  she can try to take iron pills daily but need to recheck in 4 weeks and rediscuss need for IV iron.

## 2021-08-19 NOTE — Telephone Encounter (Signed)
Scheduled per 7/12 los, pt has been called and confirmed  

## 2021-08-21 LAB — METHYLMALONIC ACID, SERUM: Methylmalonic Acid, Quantitative: 259 nmol/L (ref 0–378)

## 2021-09-08 IMAGING — RF DG ESOPHAGUS
4 series · 11 of 11 positions shown · non-contrast
Comparison: None.

CLINICAL DATA: Dysphagia. Encounter for fitting and adjustment of
gastric lap band.

EXAM:
ESOPHOGRAM/BARIUM SWALLOW
TECHNIQUE: Single contrast examination was performed using  thin barium.
FLUOROSCOPY TIME:  Fluoroscopy Time:  2 minutes 18 seconds
Radiation Exposure Index (if provided by the fluoroscopic device):
508 mGy
Number of Acquired Spot Images: 0

[Series 1: one shot · 0.14mm/px · 1 of 1 slices shown (1 of 2)]
[im 1/1]
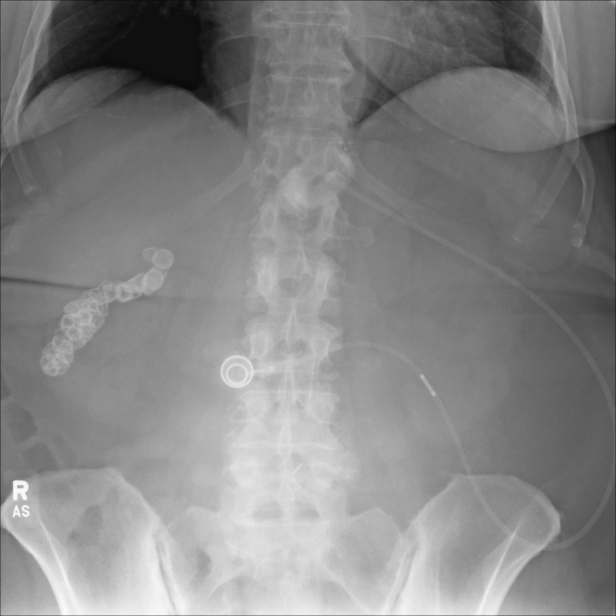

[Series 2: sequence · 4 of 150 frames shown (1 of 2)]
[frame 23/150]
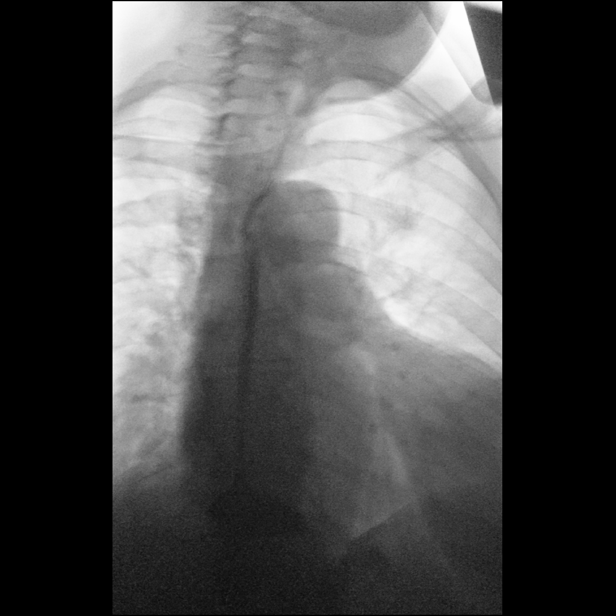
[frame 48/150]
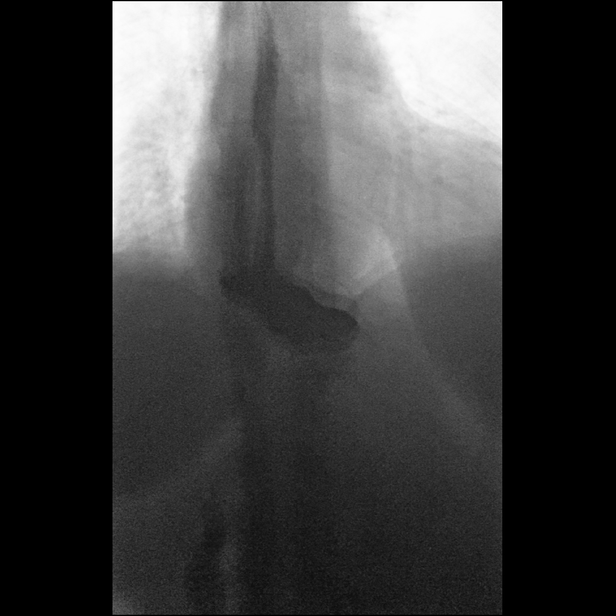
[frame 76/150]
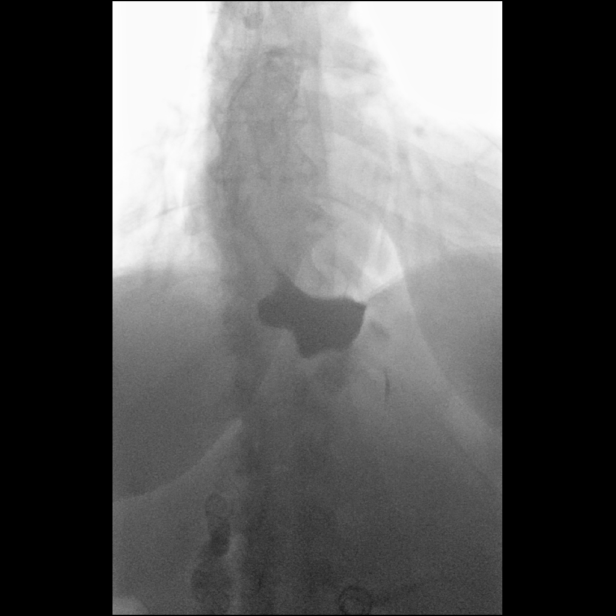
[frame 128/150]
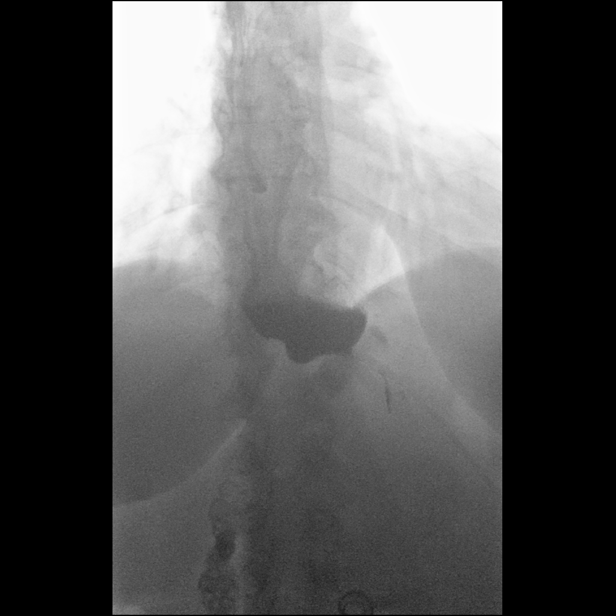

[Series 3: sequence · 4 of 99 frames shown (2 of 2)]
[frame 15/99]
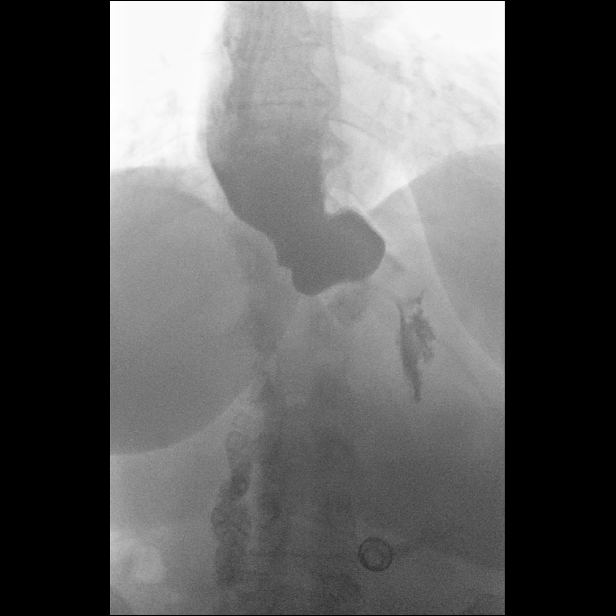
[frame 50/99]
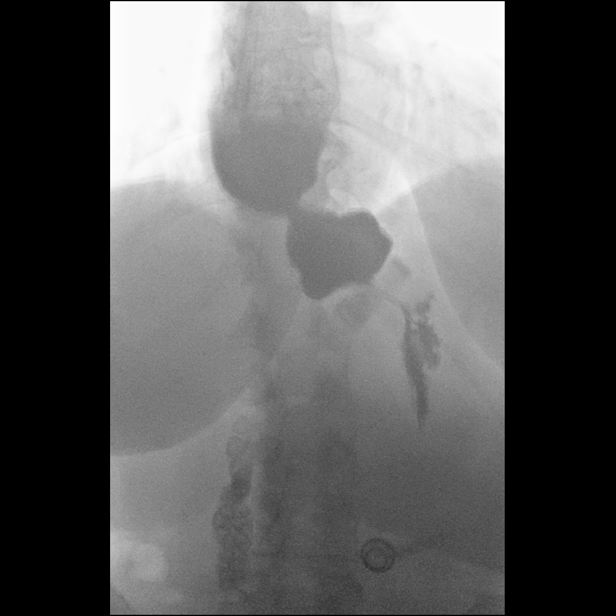
[frame 72/99]
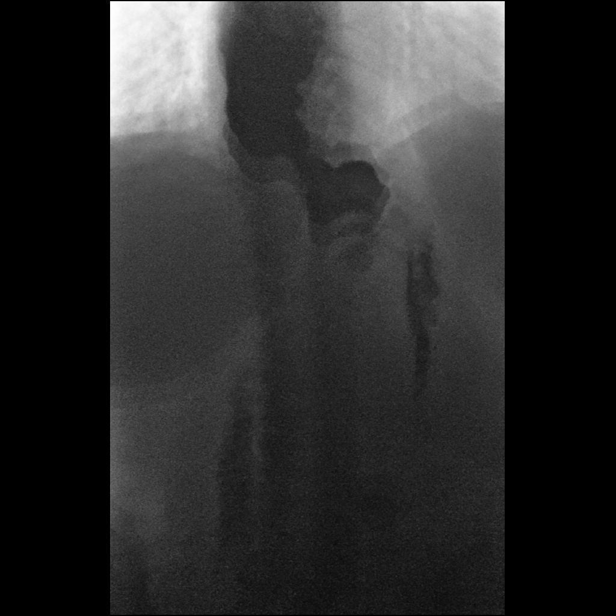
[frame 85/99]
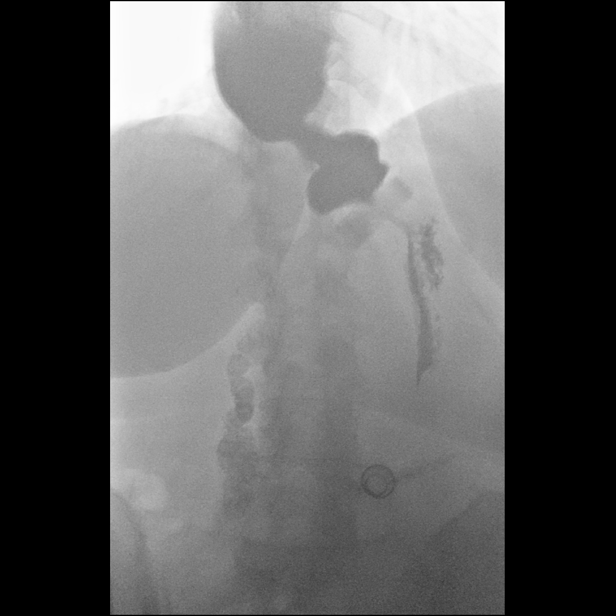

[Series 4: one shot · 0.15mm/px · 2 of 2 slices shown (2 of 2)]
[im 1/2]
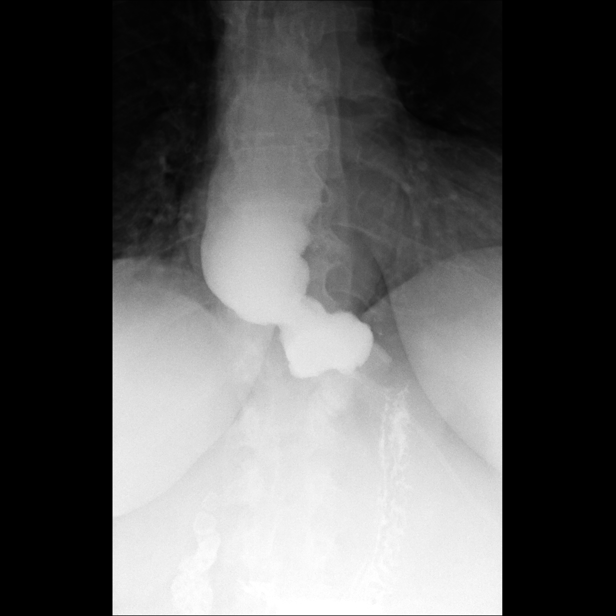
[im 2/2]
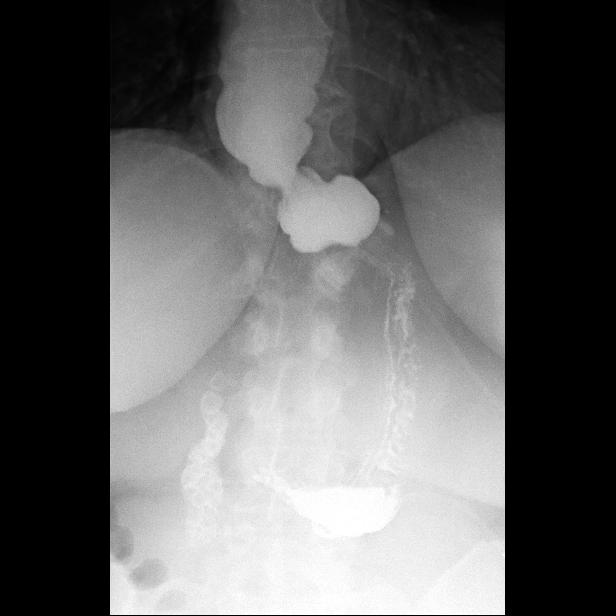

[11 of 11 positions shown; findings below may reference images not displayed]

FINDINGS: Scout film of the abdomen demonstrates lap band device in place in
expected position. Multiple calcified gallstones in the right upper
quadrant. Nonobstructive bowel gas pattern.

Fluoroscopic evaluation of swallowing demonstrates loss of primary
esophageal peristaltic waves. There is stasis of contrast within the
esophagus in the upright position at the level of the lap band. Only
a very small amount of contrast passes through the lap band into the
stomach. The patient was kept upright for approximately 10-15
minutes and then reimaged, with only a small additional amount of
contrast passing through the lap band into the stomach and continued
full column stasis of contrast in the esophagus.
IMPRESSION: Near complete obstruction of the distal esophagus due to the lap
band with only a small amount of contrast passing into the stomach
after approximately 15 minutes. Not enough contrast passed into the
stomach to obtain diagnostic images of the stomach or duodenum.

Cholelithiasis.

## 2021-09-10 DIAGNOSIS — E538 Deficiency of other specified B group vitamins: Secondary | ICD-10-CM | POA: Diagnosis not present

## 2021-09-15 ENCOUNTER — Inpatient Hospital Stay: Payer: Medicare HMO | Attending: Physician Assistant

## 2021-09-15 ENCOUNTER — Other Ambulatory Visit: Payer: Self-pay

## 2021-09-15 DIAGNOSIS — Z86718 Personal history of other venous thrombosis and embolism: Secondary | ICD-10-CM | POA: Diagnosis not present

## 2021-09-15 DIAGNOSIS — E782 Mixed hyperlipidemia: Secondary | ICD-10-CM | POA: Diagnosis not present

## 2021-09-15 DIAGNOSIS — Z86711 Personal history of pulmonary embolism: Secondary | ICD-10-CM | POA: Diagnosis not present

## 2021-09-15 DIAGNOSIS — E538 Deficiency of other specified B group vitamins: Secondary | ICD-10-CM | POA: Diagnosis not present

## 2021-09-15 DIAGNOSIS — I10 Essential (primary) hypertension: Secondary | ICD-10-CM | POA: Diagnosis not present

## 2021-09-15 DIAGNOSIS — D649 Anemia, unspecified: Secondary | ICD-10-CM | POA: Diagnosis not present

## 2021-09-15 DIAGNOSIS — D509 Iron deficiency anemia, unspecified: Secondary | ICD-10-CM | POA: Diagnosis not present

## 2021-09-15 DIAGNOSIS — K219 Gastro-esophageal reflux disease without esophagitis: Secondary | ICD-10-CM | POA: Diagnosis not present

## 2021-09-15 DIAGNOSIS — E1169 Type 2 diabetes mellitus with other specified complication: Secondary | ICD-10-CM | POA: Diagnosis not present

## 2021-09-15 DIAGNOSIS — N183 Chronic kidney disease, stage 3 unspecified: Secondary | ICD-10-CM | POA: Diagnosis not present

## 2021-09-15 DIAGNOSIS — D508 Other iron deficiency anemias: Secondary | ICD-10-CM

## 2021-09-15 LAB — CBC WITH DIFFERENTIAL (CANCER CENTER ONLY)
Abs Immature Granulocytes: 0.01 10*3/uL (ref 0.00–0.07)
Basophils Absolute: 0.1 10*3/uL (ref 0.0–0.1)
Basophils Relative: 1 %
Eosinophils Absolute: 0.1 10*3/uL (ref 0.0–0.5)
Eosinophils Relative: 2 %
HCT: 28.1 % — ABNORMAL LOW (ref 36.0–46.0)
Hemoglobin: 8.4 g/dL — ABNORMAL LOW (ref 12.0–15.0)
Immature Granulocytes: 0 %
Lymphocytes Relative: 22 %
Lymphs Abs: 1.2 10*3/uL (ref 0.7–4.0)
MCH: 22 pg — ABNORMAL LOW (ref 26.0–34.0)
MCHC: 29.9 g/dL — ABNORMAL LOW (ref 30.0–36.0)
MCV: 73.8 fL — ABNORMAL LOW (ref 80.0–100.0)
Monocytes Absolute: 0.5 10*3/uL (ref 0.1–1.0)
Monocytes Relative: 9 %
Neutro Abs: 3.7 10*3/uL (ref 1.7–7.7)
Neutrophils Relative %: 66 %
Platelet Count: 180 10*3/uL (ref 150–400)
RBC: 3.81 MIL/uL — ABNORMAL LOW (ref 3.87–5.11)
RDW: 18.2 % — ABNORMAL HIGH (ref 11.5–15.5)
WBC Count: 5.6 10*3/uL (ref 4.0–10.5)
nRBC: 0 % (ref 0.0–0.2)

## 2021-09-15 LAB — IRON AND IRON BINDING CAPACITY (CC-WL,HP ONLY)
Iron: 23 ug/dL — ABNORMAL LOW (ref 28–170)
Saturation Ratios: 5 % — ABNORMAL LOW (ref 10.4–31.8)
TIBC: 448 ug/dL (ref 250–450)
UIBC: 425 ug/dL

## 2021-09-15 LAB — SAMPLE TO BLOOD BANK

## 2021-09-16 ENCOUNTER — Telehealth: Payer: Self-pay | Admitting: Physician Assistant

## 2021-09-16 DIAGNOSIS — D508 Other iron deficiency anemias: Secondary | ICD-10-CM

## 2021-09-16 DIAGNOSIS — D509 Iron deficiency anemia, unspecified: Secondary | ICD-10-CM | POA: Insufficient documentation

## 2021-09-16 LAB — FERRITIN: Ferritin: 2 ng/mL — ABNORMAL LOW (ref 11–307)

## 2021-09-16 NOTE — Telephone Encounter (Signed)
I spoke to Ms. Particia Nearing to review the lab results from yesterday.  Findings show persistent iron deficiency anemia.  Patient reports she has been diligently taking her liquid iron.  As discussed at our previous visit, I am concerned patient is not absorbing any of her iron by mouth due to history of gastric banding.  I again recommended IV iron to help bolster her iron levels.  Patient is in agreement to move forward with IV iron infusions.  We will schedule IV iron next week and then have a follow-up in clinic with [place] in 4 weeks for repeat labs.  Patient expressed understanding of the plan provided.

## 2021-09-20 ENCOUNTER — Other Ambulatory Visit: Payer: Self-pay | Admitting: Physician Assistant

## 2021-09-22 ENCOUNTER — Other Ambulatory Visit: Payer: Self-pay

## 2021-09-22 ENCOUNTER — Inpatient Hospital Stay: Payer: Medicare HMO

## 2021-09-22 VITALS — BP 114/59 | HR 55 | Temp 98.0°F | Resp 17

## 2021-09-22 DIAGNOSIS — E538 Deficiency of other specified B group vitamins: Secondary | ICD-10-CM | POA: Diagnosis not present

## 2021-09-22 DIAGNOSIS — D509 Iron deficiency anemia, unspecified: Secondary | ICD-10-CM | POA: Diagnosis not present

## 2021-09-22 DIAGNOSIS — Z86718 Personal history of other venous thrombosis and embolism: Secondary | ICD-10-CM | POA: Diagnosis not present

## 2021-09-22 DIAGNOSIS — Z86711 Personal history of pulmonary embolism: Secondary | ICD-10-CM | POA: Diagnosis not present

## 2021-09-22 DIAGNOSIS — D508 Other iron deficiency anemias: Secondary | ICD-10-CM

## 2021-09-22 MED ORDER — SODIUM CHLORIDE 0.9 % IV SOLN: Freq: Once | INTRAVENOUS | Status: AC

## 2021-09-22 MED ORDER — SODIUM CHLORIDE 0.9 % IV SOLN
200.0000 mg | Freq: Once | INTRAVENOUS | Status: AC
  Administered 2021-09-22: 200 mg via INTRAVENOUS
  Filled 2021-09-22: qty 200

## 2021-09-22 NOTE — Patient Instructions (Signed)

## 2021-09-29 ENCOUNTER — Inpatient Hospital Stay: Payer: Medicare HMO

## 2021-09-29 ENCOUNTER — Other Ambulatory Visit: Payer: Self-pay

## 2021-09-29 VITALS — BP 117/61 | HR 60 | Temp 97.7°F | Resp 16

## 2021-09-29 DIAGNOSIS — E538 Deficiency of other specified B group vitamins: Secondary | ICD-10-CM | POA: Diagnosis not present

## 2021-09-29 DIAGNOSIS — D509 Iron deficiency anemia, unspecified: Secondary | ICD-10-CM | POA: Diagnosis not present

## 2021-09-29 DIAGNOSIS — Z86711 Personal history of pulmonary embolism: Secondary | ICD-10-CM | POA: Diagnosis not present

## 2021-09-29 DIAGNOSIS — Z86718 Personal history of other venous thrombosis and embolism: Secondary | ICD-10-CM | POA: Diagnosis not present

## 2021-09-29 DIAGNOSIS — D508 Other iron deficiency anemias: Secondary | ICD-10-CM

## 2021-09-29 MED ORDER — SODIUM CHLORIDE 0.9 % IV SOLN
200.0000 mg | Freq: Once | INTRAVENOUS | Status: AC
  Administered 2021-09-29: 200 mg via INTRAVENOUS
  Filled 2021-09-29: qty 200

## 2021-09-29 MED ORDER — SODIUM CHLORIDE 0.9 % IV SOLN: Freq: Once | INTRAVENOUS | Status: AC

## 2021-09-29 NOTE — Patient Instructions (Signed)

## 2021-10-06 ENCOUNTER — Other Ambulatory Visit: Payer: Self-pay

## 2021-10-06 ENCOUNTER — Inpatient Hospital Stay: Payer: Medicare HMO

## 2021-10-06 VITALS — BP 119/59 | HR 55 | Temp 97.9°F | Resp 17

## 2021-10-06 DIAGNOSIS — D508 Other iron deficiency anemias: Secondary | ICD-10-CM

## 2021-10-06 DIAGNOSIS — Z86718 Personal history of other venous thrombosis and embolism: Secondary | ICD-10-CM | POA: Diagnosis not present

## 2021-10-06 DIAGNOSIS — Z86711 Personal history of pulmonary embolism: Secondary | ICD-10-CM | POA: Diagnosis not present

## 2021-10-06 DIAGNOSIS — D509 Iron deficiency anemia, unspecified: Secondary | ICD-10-CM | POA: Diagnosis not present

## 2021-10-06 DIAGNOSIS — E538 Deficiency of other specified B group vitamins: Secondary | ICD-10-CM | POA: Diagnosis not present

## 2021-10-06 MED ORDER — SODIUM CHLORIDE 0.9 % IV SOLN: Freq: Once | INTRAVENOUS | Status: AC

## 2021-10-06 MED ORDER — SODIUM CHLORIDE 0.9 % IV SOLN
200.0000 mg | Freq: Once | INTRAVENOUS | Status: AC
  Administered 2021-10-06: 200 mg via INTRAVENOUS
  Filled 2021-10-06: qty 200

## 2021-10-06 NOTE — Progress Notes (Signed)
Pt tolerated IV iron infusion well. Declined to stay for 30 minute post observation period. VSS, ambulatory to lobby.  

## 2021-10-06 NOTE — Patient Instructions (Signed)

## 2021-10-08 DIAGNOSIS — E538 Deficiency of other specified B group vitamins: Secondary | ICD-10-CM | POA: Diagnosis not present

## 2021-10-13 ENCOUNTER — Other Ambulatory Visit: Payer: Self-pay

## 2021-10-13 ENCOUNTER — Inpatient Hospital Stay: Payer: Medicare HMO | Attending: Physician Assistant

## 2021-10-13 VITALS — BP 125/53 | HR 54 | Temp 98.1°F | Resp 17

## 2021-10-13 DIAGNOSIS — Z7901 Long term (current) use of anticoagulants: Secondary | ICD-10-CM | POA: Diagnosis not present

## 2021-10-13 DIAGNOSIS — Z803 Family history of malignant neoplasm of breast: Secondary | ICD-10-CM | POA: Diagnosis not present

## 2021-10-13 DIAGNOSIS — I10 Essential (primary) hypertension: Secondary | ICD-10-CM | POA: Diagnosis not present

## 2021-10-13 DIAGNOSIS — E538 Deficiency of other specified B group vitamins: Secondary | ICD-10-CM | POA: Diagnosis not present

## 2021-10-13 DIAGNOSIS — Z90710 Acquired absence of both cervix and uterus: Secondary | ICD-10-CM | POA: Insufficient documentation

## 2021-10-13 DIAGNOSIS — D509 Iron deficiency anemia, unspecified: Secondary | ICD-10-CM | POA: Insufficient documentation

## 2021-10-13 DIAGNOSIS — E1159 Type 2 diabetes mellitus with other circulatory complications: Secondary | ICD-10-CM | POA: Diagnosis not present

## 2021-10-13 DIAGNOSIS — Z86711 Personal history of pulmonary embolism: Secondary | ICD-10-CM | POA: Insufficient documentation

## 2021-10-13 DIAGNOSIS — Z86718 Personal history of other venous thrombosis and embolism: Secondary | ICD-10-CM | POA: Diagnosis not present

## 2021-10-13 DIAGNOSIS — E785 Hyperlipidemia, unspecified: Secondary | ICD-10-CM | POA: Diagnosis not present

## 2021-10-13 DIAGNOSIS — D508 Other iron deficiency anemias: Secondary | ICD-10-CM

## 2021-10-13 MED ORDER — SODIUM CHLORIDE 0.9 % IV SOLN: Freq: Once | INTRAVENOUS | Status: AC

## 2021-10-13 MED ORDER — SODIUM CHLORIDE 0.9 % IV SOLN
200.0000 mg | Freq: Once | INTRAVENOUS | Status: AC
  Administered 2021-10-13: 200 mg via INTRAVENOUS
  Filled 2021-10-13: qty 200

## 2021-10-13 NOTE — Progress Notes (Signed)
Pt declined to stay for post 30 obs, ambulatory to lobby with VSS upon discharge 

## 2021-10-13 NOTE — Patient Instructions (Signed)

## 2021-10-20 ENCOUNTER — Inpatient Hospital Stay: Payer: Medicare HMO

## 2021-10-20 ENCOUNTER — Other Ambulatory Visit: Payer: Self-pay

## 2021-10-20 VITALS — BP 132/61 | HR 55 | Temp 98.1°F | Resp 17

## 2021-10-20 DIAGNOSIS — D508 Other iron deficiency anemias: Secondary | ICD-10-CM

## 2021-10-20 DIAGNOSIS — Z86711 Personal history of pulmonary embolism: Secondary | ICD-10-CM | POA: Diagnosis not present

## 2021-10-20 DIAGNOSIS — Z803 Family history of malignant neoplasm of breast: Secondary | ICD-10-CM | POA: Diagnosis not present

## 2021-10-20 DIAGNOSIS — Z90710 Acquired absence of both cervix and uterus: Secondary | ICD-10-CM | POA: Diagnosis not present

## 2021-10-20 DIAGNOSIS — Z7901 Long term (current) use of anticoagulants: Secondary | ICD-10-CM | POA: Diagnosis not present

## 2021-10-20 DIAGNOSIS — I10 Essential (primary) hypertension: Secondary | ICD-10-CM | POA: Diagnosis not present

## 2021-10-20 DIAGNOSIS — E1159 Type 2 diabetes mellitus with other circulatory complications: Secondary | ICD-10-CM | POA: Diagnosis not present

## 2021-10-20 DIAGNOSIS — E538 Deficiency of other specified B group vitamins: Secondary | ICD-10-CM | POA: Diagnosis not present

## 2021-10-20 DIAGNOSIS — E785 Hyperlipidemia, unspecified: Secondary | ICD-10-CM | POA: Diagnosis not present

## 2021-10-20 DIAGNOSIS — Z86718 Personal history of other venous thrombosis and embolism: Secondary | ICD-10-CM | POA: Diagnosis not present

## 2021-10-20 DIAGNOSIS — D509 Iron deficiency anemia, unspecified: Secondary | ICD-10-CM | POA: Diagnosis not present

## 2021-10-20 MED ORDER — SODIUM CHLORIDE 0.9 % IV SOLN: Freq: Once | INTRAVENOUS | Status: AC

## 2021-10-20 MED ORDER — SODIUM CHLORIDE 0.9 % IV SOLN
200.0000 mg | Freq: Once | INTRAVENOUS | Status: AC
  Administered 2021-10-20: 200 mg via INTRAVENOUS
  Filled 2021-10-20: qty 200

## 2021-10-20 NOTE — Patient Instructions (Signed)

## 2021-10-22 ENCOUNTER — Other Ambulatory Visit: Payer: Medicare HMO

## 2021-10-22 ENCOUNTER — Ambulatory Visit: Payer: Medicare HMO | Admitting: Physician Assistant

## 2021-10-25 ENCOUNTER — Other Ambulatory Visit: Payer: Self-pay | Admitting: Physician Assistant

## 2021-10-25 ENCOUNTER — Other Ambulatory Visit (HOSPITAL_COMMUNITY): Payer: Self-pay | Admitting: Physician Assistant

## 2021-10-25 DIAGNOSIS — D508 Other iron deficiency anemias: Secondary | ICD-10-CM

## 2021-10-26 ENCOUNTER — Inpatient Hospital Stay: Payer: Medicare HMO | Admitting: Physician Assistant

## 2021-10-26 ENCOUNTER — Other Ambulatory Visit: Payer: Self-pay

## 2021-10-26 ENCOUNTER — Telehealth: Payer: Self-pay

## 2021-10-26 ENCOUNTER — Inpatient Hospital Stay: Payer: Medicare HMO

## 2021-10-26 VITALS — BP 149/73 | HR 58 | Temp 97.7°F | Resp 14 | Ht 62.0 in | Wt 231.8 lb

## 2021-10-26 DIAGNOSIS — D508 Other iron deficiency anemias: Secondary | ICD-10-CM

## 2021-10-26 DIAGNOSIS — Z90710 Acquired absence of both cervix and uterus: Secondary | ICD-10-CM | POA: Diagnosis not present

## 2021-10-26 DIAGNOSIS — E785 Hyperlipidemia, unspecified: Secondary | ICD-10-CM | POA: Diagnosis not present

## 2021-10-26 DIAGNOSIS — Z86718 Personal history of other venous thrombosis and embolism: Secondary | ICD-10-CM | POA: Diagnosis not present

## 2021-10-26 DIAGNOSIS — Z86711 Personal history of pulmonary embolism: Secondary | ICD-10-CM | POA: Diagnosis not present

## 2021-10-26 DIAGNOSIS — E1159 Type 2 diabetes mellitus with other circulatory complications: Secondary | ICD-10-CM | POA: Diagnosis not present

## 2021-10-26 DIAGNOSIS — I10 Essential (primary) hypertension: Secondary | ICD-10-CM | POA: Diagnosis not present

## 2021-10-26 DIAGNOSIS — Z7901 Long term (current) use of anticoagulants: Secondary | ICD-10-CM | POA: Diagnosis not present

## 2021-10-26 DIAGNOSIS — Z803 Family history of malignant neoplasm of breast: Secondary | ICD-10-CM | POA: Diagnosis not present

## 2021-10-26 DIAGNOSIS — D509 Iron deficiency anemia, unspecified: Secondary | ICD-10-CM | POA: Diagnosis not present

## 2021-10-26 DIAGNOSIS — E538 Deficiency of other specified B group vitamins: Secondary | ICD-10-CM | POA: Diagnosis not present

## 2021-10-26 LAB — CMP (CANCER CENTER ONLY)
ALT: 8 U/L (ref 0–44)
AST: 10 U/L — ABNORMAL LOW (ref 15–41)
Albumin: 3.8 g/dL (ref 3.5–5.0)
Alkaline Phosphatase: 49 U/L (ref 38–126)
Anion gap: 6 (ref 5–15)
BUN: 17 mg/dL (ref 8–23)
CO2: 28 mmol/L (ref 22–32)
Calcium: 9.4 mg/dL (ref 8.9–10.3)
Chloride: 106 mmol/L (ref 98–111)
Creatinine: 1.38 mg/dL — ABNORMAL HIGH (ref 0.44–1.00)
GFR, Estimated: 40 mL/min — ABNORMAL LOW (ref >60)
Glucose, Bld: 199 mg/dL — ABNORMAL HIGH (ref 70–99)
Potassium: 3.4 mmol/L — ABNORMAL LOW (ref 3.5–5.1)
Sodium: 140 mmol/L (ref 135–145)
Total Bilirubin: 0.6 mg/dL (ref 0.3–1.2)
Total Protein: 6.4 g/dL — ABNORMAL LOW (ref 6.5–8.1)

## 2021-10-26 LAB — CBC WITH DIFFERENTIAL (CANCER CENTER ONLY)
Abs Immature Granulocytes: 0.02 10*3/uL (ref 0.00–0.07)
Basophils Absolute: 0.1 10*3/uL (ref 0.0–0.1)
Basophils Relative: 1 %
Eosinophils Absolute: 0.2 10*3/uL (ref 0.0–0.5)
Eosinophils Relative: 3 %
HCT: 33.8 % — ABNORMAL LOW (ref 36.0–46.0)
Hemoglobin: 10.5 g/dL — ABNORMAL LOW (ref 12.0–15.0)
Immature Granulocytes: 0 %
Lymphocytes Relative: 26 %
Lymphs Abs: 1.2 10*3/uL (ref 0.7–4.0)
MCH: 25.3 pg — ABNORMAL LOW (ref 26.0–34.0)
MCHC: 31.1 g/dL (ref 30.0–36.0)
MCV: 81.4 fL (ref 80.0–100.0)
Monocytes Absolute: 0.4 10*3/uL (ref 0.1–1.0)
Monocytes Relative: 8 %
Neutro Abs: 2.9 10*3/uL (ref 1.7–7.7)
Neutrophils Relative %: 62 %
Platelet Count: 172 10*3/uL (ref 150–400)
RBC: 4.15 MIL/uL (ref 3.87–5.11)
RDW: 22.3 % — ABNORMAL HIGH (ref 11.5–15.5)
WBC Count: 4.8 10*3/uL (ref 4.0–10.5)
nRBC: 0 % (ref 0.0–0.2)

## 2021-10-26 LAB — IRON AND IRON BINDING CAPACITY (CC-WL,HP ONLY)
Iron: 44 ug/dL (ref 28–170)
Saturation Ratios: 15 % (ref 10.4–31.8)
TIBC: 290 ug/dL (ref 250–450)
UIBC: 246 ug/dL (ref 148–442)

## 2021-10-26 LAB — FERRITIN: Ferritin: 63 ng/mL (ref 11–307)

## 2021-10-26 NOTE — Progress Notes (Signed)
[facility] [phone]   [phone]  PROGRESS NOTE  Patient Care Team: Lawerance Cruel, MD as PCP - General (Family Medicine) Leonie Man, MD as PCP - Cardiology (Cardiology)  Hematological/Oncological History -11/09/2020-11/13/2020: Admitted due to progressive dyspnea.  CT angiogram of the chest showed extensive bilateral pulmonary emboli with associated right heart strain.  Doppler ultrasound revealed acute DVT involving the left posterior tibial veins, left peroneal veins and right posterior tibial veins.  Patient was started on IV heparin drip and transition to Eliquis.    -05/17/2021: Establish care with Dede Query PA-C  -09/22/2021-10/20/2021: Received IV venofer 200 mg once a week x 5 doses  CHIEF COMPLAINTS/PURPOSE OF CONSULTATION:  -H/O bilateral DVT and pulmonary emboli -Iron deficiency anemia/folate deficiency/vitamin B12 deficiency  HISTORY OF PRESENTING ILLNESS:  Sandra Beasley 71 y.o. female returns for a follow up for iron, B12 and folate deficiency. She is unaccompanied for this visit.  He was last seen on 08/18/2021.  In the interim she has received IV iron infusions.  On exam today,Sandra Beasley reports is receiving IV iron her energy levels have markedly improved.  He is no longer falling asleep during the day and is able to do more activities without resting.  She reports her dizziness has resolved.  She discontinued her p.o. iron supplementation.  She adds that with the first 2 IV iron infusion, she developed some nausea and diarrhea which resolved on its own without any supportive occasions. She denies easy bruising or signs of active bleeding. She denies fevers, chills, sweats, shortness of breath, chest pain or cough. She has no other complaints. Rest of the 10 point ROS is below  MEDICAL HISTORY:  Past Medical History:  Diagnosis Date   Adenomatous polyp of colon 09/2005   CAD S/P percutaneous coronary angioplasty - DES  x2 in RCA 01/02/2013; 02/2013   a. 100% RPDA - Xience Xpedition DES 2.25 mm x 15 mm + 2.25 mm x 8 mm overlapping, mod- severe distal LAD and prox OM1 lesions.;b.  Myoview 02/2013 & Jan 2020: No ischemia or Infarction, Normal EF.   GERD (gastroesophageal reflux disease) 03/06/2013   Hyperlipidemia associated with type 2 diabetes mellitus ([place])    Hypertension    Obesity    STEMI (ST elevation myocardial infarction) ([place]) 01/02/13   --> PCI; EF 60 and 65%. Gr 1 DD & No regional WMA, Otherwise relatively normal.   Type 2 diabetes mellitus with circulatory disorder (HCC)    Vitamin D deficiency     SURGICAL HISTORY: Past Surgical History:  Procedure Laterality Date   ABDOMINAL HYSTERECTOMY     CORONARY ANGIOPLASTY WITH STENT PLACEMENT  01/02/2013    PCI-RCA: Xience Xpedition 2.25 mm x 15 mm, 2.25 mm x 8 mm (2.4 mm)   LAPAROSCOPIC GASTRIC BANDING  2006   LAPAROSCOPIC HYSTERECTOMY  1996   LEFT HEART CATHETERIZATION WITH CORONARY ANGIOGRAM N/A 01/02/2013   Procedure: LEFT HEART CATHETERIZATION WITH CORONARY ANGIOGRAM;  Surgeon: Leonie Man, MD;  Location: [facility] CATH LAB;: Inferior STEMI: 100% PDA; mid-distal LAD beyond D1 (small caliber) 80%.  Ostial D1 40%.  Lateral OM 1 with 70-80% stenosis.  Large branching ramus intermedius with OM and DIAG branch branches.   NM MYOVIEW LTD  01/'15; 1/'20   a) EF 60-65%.  Normal wall motion.  No ischemia or infarction.; b) EF 70 to 75%.  No EKG changes.  No evidence of ischemia or infarction.:   TONSILLECTOMY  1966   TRANSTHORACIC ECHOCARDIOGRAM  01/04/2013   (Post inferior STEMI) EF 60 and 65%. Grade 1 diastolic dysfunction with no regional wall motion abnormalities. Otherwise relatively normal.    SOCIAL HISTORY: Social History   Socioeconomic History   Marital status: Divorced    Spouse name: Not on file   Number of children: 3   Years of education: Not on file   Highest education level: Not on file  Occupational History   Occupation: Teacher   Tobacco Use   Smoking status: Never   Smokeless tobacco: Never  Substance and Sexual Activity   Alcohol use: No   Drug use: No   Sexual activity: Not on file  Other Topics Concern   Not on file  Social History Narrative   She is a divorced nonsmoker who works for [employer]. She is currently now in cardiac rehabilitation. Has changed her dietary habits to a Vegan lifestyle.   Social Determinants of Health   Financial Resource Strain: Not on file  Food Insecurity: Not on file  Transportation Needs: Not on file  Physical Activity: Not on file  Stress: Not on file  Social Connections: Not on file  Intimate Partner Violence: Not on file    FAMILY HISTORY: Family History  Adopted: Yes  Problem Relation Age of Onset   Breast cancer Maternal Aunt    Diabetes Maternal Aunt    Heart disease Maternal Aunt    Diabetes Maternal Uncle    Diabetes gravidarum Maternal Uncle    Colon cancer Neg Hx    Stomach cancer Neg Hx    Rectal cancer Neg Hx     ALLERGIES:  is allergic to ace inhibitors, actos [pioglitazone], other, and statins.  MEDICATIONS:  Current Outpatient Medications  Medication Sig Dispense Refill   apixaban (ELIQUIS) 5 MG TABS tablet Please continue with 2 tablets (10 mg) by mouth twice daily. On 11/19/2020 transition to 1 tablet (5 mg) twice daily. 70 tablet 0   candesartan (ATACAND) 32 MG tablet Take 32 mg by mouth daily.     carvedilol (COREG) 25 MG tablet Take 25 mg by mouth 2 (two) times daily.     Cholecalciferol 1000 UNITS TBDP Take 1,000 Units by mouth every morning.      cyanocobalamin (,VITAMIN B-12,) 1000 MCG/ML injection 1 mL     folic acid (FOLVITE) 1 MG tablet Take 1 tablet (1 mg total) by mouth daily. 30 tablet 6   glimepiride (AMARYL) 4 MG tablet Take 4 mg by mouth daily before breakfast.     metFORMIN (GLUCOPHAGE) 500 MG tablet Take 1,000 mg by mouth 2 (two) times daily with a meal.     metroNIDAZOLE (METROCREAM) 0.75 % cream Apply 1 application  topically 2 (two) times daily as needed (rosacea). Apply to face     nitroGLYCERIN (NITROSTAT) 0.4 MG SL tablet Place 1 tablet (0.4 mg total) under the tongue every 5 (five) minutes as needed for chest pain. 25 tablet 1   pantoprazole (PROTONIX) 40 MG tablet Take 1 tablet (40 mg total) by mouth daily. 30 tablet 1   rosuvastatin (CRESTOR) 10 MG tablet Take 10 mg by mouth daily.  4   triamcinolone cream (KENALOG) 0.1 % Apply 1 application topically 2 (two) times daily as needed (rash/irritation).     amLODipine (NORVASC) 5 MG tablet Take 1 tablet (5 mg total) by mouth at bedtime. 30 tablet 5   No current facility-administered medications for this visit.    REVIEW OF SYSTEMS:   Constitutional: ( - ) fevers, ( - )  chills , ( - ) night sweats Eyes: ( - ) blurriness of vision, ( - ) double vision, ( - ) watery eyes Ears, nose, mouth, throat, and face: ( - ) mucositis, ( - ) sore throat Respiratory: ( - ) cough, ( - ) dyspnea, ( - ) wheezes Cardiovascular: ( - ) palpitation, ( - ) chest discomfort, ( - ) lower extremity swelling Gastrointestinal:  ( - ) nausea, ( - ) heartburn, ( - ) change in bowel habits Skin: ( - ) abnormal skin rashes Lymphatics: ( - ) new lymphadenopathy, ( - ) easy bruising Neurological: ( - ) numbness, ( - ) tingling, ( - ) new weaknesses Behavioral/Psych: ( - ) mood change, ( - ) new changes  All other systems were reviewed with the patient and are negative.  PHYSICAL EXAMINATION: ECOG PERFORMANCE STATUS: 0 - Asymptomatic  Vitals:   10/26/21 0850  BP: (!) 149/73  Pulse: (!) 58  Resp: 14  Temp: 97.7 F (36.5 C)  SpO2: 95%   Filed Weights   10/26/21 0850  Weight: 231 lb 12.8 oz (105.1 kg)    GENERAL: well appearing female in NAD  SKIN: skin color, texture, turgor are normal, no rashes or significant lesions EYES: conjunctiva are pink and non-injected, sclera clear OROPHARYNX: no exudate, no erythema; lips, buccal mucosa, and tongue normal  LUNGS: clear to  auscultation and percussion with normal breathing effort HEART: regular rate & rhythm and no murmurs and no lower extremity edema Musculoskeletal: no cyanosis of digits and no clubbing  PSYCH: alert & oriented x 3, fluent speech NEURO: no focal motor/sensory deficits  LABORATORY DATA:  I have reviewed the data as listed    Latest Ref Rng & Units 10/26/2021    8:33 AM 09/15/2021    2:30 PM 08/18/2021    9:22 AM  CBC  WBC 4.0 - 10.5 K/uL 4.8  5.6  5.6   Hemoglobin 12.0 - 15.0 g/dL 10.5  8.4  8.6   Hematocrit 36.0 - 46.0 % 33.8  28.1  29.5   Platelets 150 - 400 K/uL 172  180  207        Latest Ref Rng & Units 10/26/2021    8:33 AM 08/18/2021    9:22 AM 05/17/2021    3:52 PM  CMP  Glucose 70 - 99 mg/dL 199  138  207   BUN 8 - 23 mg/dL '17  25  21   '$ Creatinine 0.44 - 1.00 mg/dL 1.38  1.60  1.32   Sodium 135 - 145 mmol/L 140  139  140   Potassium 3.5 - 5.1 mmol/L 3.4  3.6  3.9   Chloride 98 - 111 mmol/L 106  104  110   CO2 22 - 32 mmol/L '28  26  22   '$ Calcium 8.9 - 10.3 mg/dL 9.4  9.2  9.0   Total Protein 6.5 - 8.1 g/dL 6.4  7.0  6.9   Total Bilirubin 0.3 - 1.2 mg/dL 0.6  0.8  0.5   Alkaline Phos 38 - 126 U/L 49  48  59   AST 15 - 41 U/L '10  8  8   '$ ALT 0 - 44 U/L '8  7  7     '$ ASSESSMENT & PLAN Sandra Beasley is a 71 y.o. female returns for a follow up for history of DVT/PE and nutritional deficiencies.    #H/O bilateral lower extremity DVT and bilateral pulmonary emboli with right heart strain: --Diagnosed in  October 2022 with CTA that showed extensive bilateral pulmonary emboli with associated right heart strain. Doppler US showed acute DVT involving the left posterior tibial veins, left peroneal veins and right posterior tibial veins. --Currently on Eliquis 5 mg PO twice daily, tolerating well without any toxicities. Okay to transition to maintenance dose of 2.5 mg twice daily.  --Since there is no definitive provoking factor and due to the extensive thrombotic presentation, the  recommendation is indefinite anticoagulation.  --Labs from 05/17/2021 ruled our antiphospholipid syndrome.   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 Iron panel is pending. Awaiting remaining labs to determine if additional IV iron is needed.  --Recommend lab only visit in 6 weeks and then follow up with Dr. Lorenso Courier in 12 weeks.   Orders Placed This Encounter  Procedures   CBC with Differential ([facility] Only)    Standing Status:   Standing    Number of Occurrences:   2    Standing Expiration Date:   10/27/2022   CMP ([facility] [place] only)    Standing Status:   Standing    Number of Occurrences:   2    Standing Expiration Date:   10/27/2022   Flow Cytometry, Peripheral Blood (Oncology)    Standing Status:   Standing    Number of Occurrences:   2    Standing Expiration Date:   10/27/2022   Iron and Iron Binding Capacity ([facility] only)    Standing Status:   Standing    Number of Occurrences:   2    Standing Expiration Date:   10/27/2022   Vitamin B12    Standing Status:   Standing    Number of Occurrences:   2    Standing Expiration Date:   10/27/2022   Ferritin    Standing Status:   Standing    Number of Occurrences:   2    Standing Expiration Date:   10/27/2022    All questions were answered. The patient knows to call the clinic with any problems, questions or concerns.  I have spent a total of 30 minutes minutes of face-to-face and non-face-to-face time, preparing to see the patient, performing a medically appropriate examination, counseling and educating the patient, ordering tests, documenting clinical information in the electronic health record, and care coordination.   Dede Query, PA-C Department of  Hematology/Oncology [place] at [facility] Phone: [phone]

## 2021-10-26 NOTE — Telephone Encounter (Signed)
-----   Message from Lincoln Brigham, PA-C sent at 10/26/2021  2:38 PM EDT ----- Please notify patient that iron levels show improvement. No need for additional IV iron at this time.    ----- Message ----- From: Buel Ream, [facility] [place] Sent: 10/26/2021   8:48 AM EDT To: Lincoln Brigham, PA-C

## 2021-10-26 NOTE — Telephone Encounter (Signed)
Pt advised and voiced understanding.  Per pt request, lab results mailed to pt.

## 2021-11-01 DIAGNOSIS — I10 Essential (primary) hypertension: Secondary | ICD-10-CM | POA: Diagnosis not present

## 2021-11-01 DIAGNOSIS — N183 Chronic kidney disease, stage 3 unspecified: Secondary | ICD-10-CM | POA: Diagnosis not present

## 2021-11-01 DIAGNOSIS — E1169 Type 2 diabetes mellitus with other specified complication: Secondary | ICD-10-CM | POA: Diagnosis not present

## 2021-11-01 DIAGNOSIS — Z6841 Body Mass Index (BMI) 40.0 and over, adult: Secondary | ICD-10-CM | POA: Diagnosis not present

## 2021-11-04 ENCOUNTER — Ambulatory Visit: Payer: Medicare HMO | Admitting: Podiatry

## 2021-11-08 DIAGNOSIS — E538 Deficiency of other specified B group vitamins: Secondary | ICD-10-CM | POA: Diagnosis not present

## 2021-11-19 ENCOUNTER — Ambulatory Visit: Payer: Medicare HMO | Admitting: Physician Assistant

## 2021-11-19 ENCOUNTER — Other Ambulatory Visit: Payer: Medicare HMO

## 2021-11-29 ENCOUNTER — Ambulatory Visit: Payer: Medicare HMO | Admitting: Podiatry

## 2021-11-29 DIAGNOSIS — E1159 Type 2 diabetes mellitus with other circulatory complications: Secondary | ICD-10-CM | POA: Diagnosis not present

## 2021-11-29 DIAGNOSIS — Z7901 Long term (current) use of anticoagulants: Secondary | ICD-10-CM | POA: Diagnosis not present

## 2021-11-29 DIAGNOSIS — L84 Corns and callosities: Secondary | ICD-10-CM

## 2021-11-29 NOTE — Progress Notes (Signed)
  Subjective:  Patient ID: Sandra Beasley, female    DOB: 06-09-1950,  MRN: [id number]  Painful thickened elongated nails, calluses returned as well  71 y.o. female returns for follow-up with the above complaint. History confirmed with patient.  Corners of the great toenails occasionally cause discomfort, denies any signs of infection.  The calluses are also thick again.  She still taking Eliquis for her DVT  Objective:  Physical Exam: warm, good capillary refill, no trophic changes or ulcerative lesions, normal DP and PT pulses, normal monofilament exam, normal sensory exam and onychomycosis.  Pinch callus bilateral hallux.  Nails elongated thickened with some subungual debris Assessment:   1. Callus of foot   2. Type 2 diabetes mellitus with other circulatory complications (HCC)   3. Chronic anticoagulation        Plan:  Patient was evaluated and treated and all questions answered.  Discussed ingrowing nails and partial permanent matricectomy.  I think right now she is cutting them too far and is causing this to worsen.  Advised to let her grow this out.  She will return to see me for this if they become bothersome.  All symptomatic hyperkeratoses were safely debrided with a sterile #15 blade to patient's level of comfort without incident. We discussed preventative and palliative care of these lesions including supportive and accommodative shoegear, padding, prefabricated and custom molded accommodative orthoses, use of a pumice stone and lotions/creams daily.    Return in about 4 months (around 04/01/2022) for painful calluses, check ingrown nails.

## 2021-12-01 ENCOUNTER — Ambulatory Visit: Payer: Medicare HMO | Admitting: Pulmonary Disease

## 2021-12-07 DIAGNOSIS — E782 Mixed hyperlipidemia: Secondary | ICD-10-CM | POA: Diagnosis not present

## 2021-12-07 DIAGNOSIS — E1169 Type 2 diabetes mellitus with other specified complication: Secondary | ICD-10-CM | POA: Diagnosis not present

## 2021-12-07 DIAGNOSIS — N183 Chronic kidney disease, stage 3 unspecified: Secondary | ICD-10-CM | POA: Diagnosis not present

## 2021-12-07 DIAGNOSIS — I10 Essential (primary) hypertension: Secondary | ICD-10-CM | POA: Diagnosis not present

## 2021-12-08 ENCOUNTER — Inpatient Hospital Stay: Payer: Medicare HMO | Attending: Physician Assistant

## 2021-12-08 ENCOUNTER — Other Ambulatory Visit: Payer: Self-pay

## 2021-12-08 DIAGNOSIS — Z9884 Bariatric surgery status: Secondary | ICD-10-CM | POA: Diagnosis not present

## 2021-12-08 DIAGNOSIS — E538 Deficiency of other specified B group vitamins: Secondary | ICD-10-CM | POA: Diagnosis not present

## 2021-12-08 DIAGNOSIS — D508 Other iron deficiency anemias: Secondary | ICD-10-CM | POA: Insufficient documentation

## 2021-12-08 LAB — CMP (CANCER CENTER ONLY)
ALT: 9 U/L (ref 0–44)
AST: 9 U/L — ABNORMAL LOW (ref 15–41)
Albumin: 3.8 g/dL (ref 3.5–5.0)
Alkaline Phosphatase: 51 U/L (ref 38–126)
Anion gap: 6 (ref 5–15)
BUN: 20 mg/dL (ref 8–23)
CO2: 30 mmol/L (ref 22–32)
Calcium: 9.1 mg/dL (ref 8.9–10.3)
Chloride: 104 mmol/L (ref 98–111)
Creatinine: 1.46 mg/dL — ABNORMAL HIGH (ref 0.44–1.00)
GFR, Estimated: 38 mL/min — ABNORMAL LOW (ref >60)
Glucose, Bld: 225 mg/dL — ABNORMAL HIGH (ref 70–99)
Potassium: 3.7 mmol/L (ref 3.5–5.1)
Sodium: 140 mmol/L (ref 135–145)
Total Bilirubin: 0.6 mg/dL (ref 0.3–1.2)
Total Protein: 7 g/dL (ref 6.5–8.1)

## 2021-12-08 LAB — CBC WITH DIFFERENTIAL (CANCER CENTER ONLY)
Abs Immature Granulocytes: 0.01 10*3/uL (ref 0.00–0.07)
Basophils Absolute: 0 10*3/uL (ref 0.0–0.1)
Basophils Relative: 1 %
Eosinophils Absolute: 0.2 10*3/uL (ref 0.0–0.5)
Eosinophils Relative: 3 %
HCT: 36 % (ref 36.0–46.0)
Hemoglobin: 11.3 g/dL — ABNORMAL LOW (ref 12.0–15.0)
Immature Granulocytes: 0 %
Lymphocytes Relative: 25 %
Lymphs Abs: 1.3 10*3/uL (ref 0.7–4.0)
MCH: 26.8 pg (ref 26.0–34.0)
MCHC: 31.4 g/dL (ref 30.0–36.0)
MCV: 85.5 fL (ref 80.0–100.0)
Monocytes Absolute: 0.4 10*3/uL (ref 0.1–1.0)
Monocytes Relative: 7 %
Neutro Abs: 3.3 10*3/uL (ref 1.7–7.7)
Neutrophils Relative %: 64 %
Platelet Count: 178 10*3/uL (ref 150–400)
RBC: 4.21 MIL/uL (ref 3.87–5.11)
RDW: 16.9 % — ABNORMAL HIGH (ref 11.5–15.5)
WBC Count: 5.2 10*3/uL (ref 4.0–10.5)
nRBC: 0 % (ref 0.0–0.2)

## 2021-12-08 LAB — IRON AND IRON BINDING CAPACITY (CC-WL,HP ONLY)
Iron: 49 ug/dL (ref 28–170)
Saturation Ratios: 15 % (ref 10.4–31.8)
TIBC: 322 ug/dL (ref 250–450)
UIBC: 273 ug/dL (ref 148–442)

## 2021-12-08 LAB — VITAMIN B12: Vitamin B-12: 475 pg/mL (ref 180–914)

## 2021-12-08 LAB — FERRITIN: Ferritin: 18 ng/mL (ref 11–307)

## 2021-12-09 ENCOUNTER — Telehealth: Payer: Self-pay

## 2021-12-09 ENCOUNTER — Other Ambulatory Visit: Payer: Self-pay | Admitting: Physician Assistant

## 2021-12-09 NOTE — Telephone Encounter (Signed)
Sandra Beasley: Please notify patient that her hemoglobin has improved but iron is starting to drop so we will give her 3 doses of IV iron to help improve the levels   Pt advised and is expecting a call from scheduling to set up appts

## 2021-12-10 ENCOUNTER — Other Ambulatory Visit: Payer: Self-pay

## 2021-12-10 DIAGNOSIS — E538 Deficiency of other specified B group vitamins: Secondary | ICD-10-CM

## 2021-12-10 MED ORDER — FOLIC ACID 1 MG PO TABS: 1.0000 mg | ORAL_TABLET | Freq: Every day | ORAL | 6 refills | Status: DC

## 2021-12-15 ENCOUNTER — Other Ambulatory Visit: Payer: Self-pay

## 2021-12-15 ENCOUNTER — Inpatient Hospital Stay: Payer: Medicare HMO

## 2021-12-15 VITALS — BP 157/69 | HR 53 | Temp 97.8°F | Resp 17

## 2021-12-15 DIAGNOSIS — D508 Other iron deficiency anemias: Secondary | ICD-10-CM | POA: Diagnosis not present

## 2021-12-15 DIAGNOSIS — Z9884 Bariatric surgery status: Secondary | ICD-10-CM | POA: Diagnosis not present

## 2021-12-15 DIAGNOSIS — E538 Deficiency of other specified B group vitamins: Secondary | ICD-10-CM | POA: Diagnosis not present

## 2021-12-15 MED ORDER — SODIUM CHLORIDE 0.9 % IV SOLN
200.0000 mg | Freq: Once | INTRAVENOUS | Status: AC
  Administered 2021-12-15: 200 mg via INTRAVENOUS
  Filled 2021-12-15: qty 200

## 2021-12-15 MED ORDER — SODIUM CHLORIDE 0.9 % IV SOLN: Freq: Once | INTRAVENOUS | Status: AC

## 2021-12-15 NOTE — Progress Notes (Signed)
Patient declined to stay 30 minute post infusion observation period. VSS, no signs of distress noted upon discharge

## 2021-12-15 NOTE — Patient Instructions (Signed)

## 2021-12-17 DIAGNOSIS — E538 Deficiency of other specified B group vitamins: Secondary | ICD-10-CM | POA: Diagnosis not present

## 2021-12-21 ENCOUNTER — Ambulatory Visit: Payer: Medicare HMO | Admitting: Cardiology

## 2021-12-23 ENCOUNTER — Encounter: Payer: Self-pay | Admitting: Pulmonary Disease

## 2021-12-23 ENCOUNTER — Ambulatory Visit: Payer: Medicare HMO | Admitting: Pulmonary Disease

## 2021-12-23 VITALS — BP 128/76 | HR 62 | Ht 62.0 in | Wt 232.0 lb

## 2021-12-23 DIAGNOSIS — I82443 Acute embolism and thrombosis of tibial vein, bilateral: Secondary | ICD-10-CM | POA: Diagnosis not present

## 2021-12-23 DIAGNOSIS — I2602 Saddle embolus of pulmonary artery with acute cor pulmonale: Secondary | ICD-10-CM

## 2021-12-23 NOTE — Progress Notes (Signed)
Synopsis: Referred in November 2022 for hospital follow up of pulmonary emboli  Subjective:   PATIENT ID: Sandra Beasley GENDER: female DOB: April 09, 1950, MRN: [id number]  HPI  Chief Complaint  Patient presents with   Follow-up    [id number]    VAmayiah Beasley a 71year old woman, never smoker with history of hypertension, DMII, CAD and hyperlipidemia who comes to pulmonary clinic for follow up of pulmonary emboli and bilateral DVT.    She was seen in oncology clinic 10/26/21 and transitioned to reduced dose eliquis.   She has been doing well since last visit. Follow up echo showed return of normal RV function.  OV 12/18/2020 She reports her activity level has returned to normal and her shortness of breath is significantly improved but does remain somewhat with exertion. She has been having issues with high blood pressure since discharge which her primary care team is following. She had a negative sleep study 10 years ago and denies snoring or apnea events.   She has returned to work. She denies any blood in her urine or stools. She does report 1 episode of coughing up blood tinged sputum two weeks ago.   Past Medical History:  Diagnosis Date   Adenomatous polyp of colon 09/2005   CAD S/P percutaneous coronary angioplasty - DES x2 in RCA 01/02/2013; 02/2013   a. 100% RPDA - Xience Xpedition DES 2.25 mm x 15 mm + 2.25 mm x 8 mm overlapping, mod- severe distal LAD and prox OM1 lesions.;b.  Myoview 02/2013 & Jan 2020: No ischemia or Infarction, Normal EF.   GERD (gastroesophageal reflux disease) 03/06/2013   Hyperlipidemia associated with type 2 diabetes mellitus ([facility]    Hypertension    Obesity    STEMI (ST elevation myocardial infarction) ([facility] 01/02/13   --> PCI; EF 60 and 65%. Gr 1 DD & No regional WMA, Otherwise relatively normal.   Type 2 diabetes mellitus with circulatory disorder ([facility]    Vitamin D deficiency      Family History  Adopted: Yes  Problem Relation Age of Onset    Breast cancer Maternal Aunt    Diabetes Maternal Aunt    Heart disease Maternal Aunt    Diabetes Maternal Uncle    Diabetes gravidarum Maternal Uncle    Colon cancer Neg Hx    Stomach cancer Neg Hx    Rectal cancer Neg Hx      Social History   Socioeconomic History   Marital status: Divorced    Spouse name: Not on file   Number of children: 3   Years of education: Not on file   Highest education level: Not on file  Occupational History   Occupation: TPharmacist, hospital Tobacco Use   Smoking status: Never   Smokeless tobacco: Never  Substance and Sexual Activity   Alcohol use: No   Drug use: No   Sexual activity: Not on file  Other Topics Concern   Not on file  Social History Narrative   She is a divorced nonsmoker who works for [employer] She is currently now in cardiac rehabilitation. Has changed her dietary habits to a Vegan lifestyle.   Social Determinants of Health   Financial Resource Strain: Not on file  Food Insecurity: Not on file  Transportation Needs: Not on file  Physical Activity: Not on file  Stress: Not on file  Social Connections: Not on file  Intimate Partner Violence: Not on file     Allergies  Allergen Reactions  Ace Inhibitors Cough   Actos [Pioglitazone] Other (See Comments)    Flu like symptoms   Other Cough    Spices make pt cough, sneeze, eyes water   Statins     Can take Crestor     Outpatient Medications Prior to Visit  Medication Sig Dispense Refill   apixaban (ELIQUIS) 5 MG TABS tablet Please continue with 2 tablets (10 mg) by mouth twice daily. On 11/19/2020 transition to 1 tablet (5 mg) twice daily. 70 tablet 0   candesartan (ATACAND) 32 MG tablet Take 32 mg by mouth daily.     carvedilol (COREG) 25 MG tablet Take 25 mg by mouth 2 (two) times daily.     Cholecalciferol 1000 UNITS TBDP Take 1,000 Units by mouth every morning.      cyanocobalamin (,VITAMIN B-12,) 1000 MCG/ML injection 1 mL     folic acid (FOLVITE) 1 MG tablet Take 1  tablet (1 mg total) by mouth daily. 30 tablet 6   glimepiride (AMARYL) 4 MG tablet Take 4 mg by mouth daily before breakfast.     metFORMIN (GLUCOPHAGE) 500 MG tablet Take 1,000 mg by mouth 2 (two) times daily with a meal.     metroNIDAZOLE (METROCREAM) 0.75 % cream Apply 1 application topically 2 (two) times daily as needed (rosacea). Apply to face     nitroGLYCERIN (NITROSTAT) 0.4 MG SL tablet Place 1 tablet (0.4 mg total) under the tongue every 5 (five) minutes as needed for chest pain. 25 tablet 1   rosuvastatin (CRESTOR) 10 MG tablet Take 10 mg by mouth daily.  4   triamcinolone cream (KENALOG) 0.1 % Apply 1 application topically 2 (two) times daily as needed (rash/irritation).     pantoprazole (PROTONIX) 40 MG tablet Take 1 tablet (40 mg total) by mouth daily. 30 tablet 1   amLODipine (NORVASC) 5 MG tablet Take 1 tablet (5 mg total) by mouth at bedtime. 30 tablet 5   No facility-administered medications prior to visit.    Review of Systems  Constitutional:  Negative for chills, fever, malaise/fatigue and weight loss.  HENT:  Negative for congestion, sinus pain and sore throat.   Eyes: Negative.   Respiratory:  Positive for shortness of breath. Negative for cough, hemoptysis, sputum production and wheezing.   Cardiovascular:  Negative for chest pain, palpitations, orthopnea, claudication and leg swelling.  Gastrointestinal:  Negative for abdominal pain, heartburn, nausea and vomiting.  Genitourinary: Negative.   Musculoskeletal:  Negative for joint pain and myalgias.  Skin:  Negative for rash.  Neurological:  Negative for weakness.  Endo/Heme/Allergies: Negative.   Psychiatric/Behavioral: Negative.      Objective:   Vitals:   12/23/21 1557  BP: 128/76  Pulse: 62  SpO2: 100%  Weight: 232 lb (105.2 kg)  Height: '5\' 2"'$  (1.575 m)    Physical Exam Constitutional:      General: She is not in acute distress.    Appearance: She is obese. She is not ill-appearing.  HENT:      Head: Normocephalic and atraumatic.  Eyes:     General: No scleral icterus.    Conjunctiva/sclera: Conjunctivae normal.     Pupils: Pupils are equal, round, and reactive to light.  Cardiovascular:     Rate and Rhythm: Normal rate and regular rhythm.     Pulses: Normal pulses.     Heart sounds: Normal heart sounds. No murmur heard. Pulmonary:     Effort: Pulmonary effort is normal.     Breath sounds: Normal breath  sounds. No wheezing, rhonchi or rales.  Musculoskeletal:     Right lower leg: No edema.     Left lower leg: No edema.  Skin:    General: Skin is warm and dry.  Neurological:     General: No focal deficit present.     Mental Status: She is alert.   CBC    Component Value Date/Time   WBC 5.2 12/08/2021 1010   WBC 10.9 (H) 11/11/2020 0217   RBC 4.21 12/08/2021 1010   HGB 11.3 (L) 12/08/2021 1010   HCT 36.0 12/08/2021 1010   PLT 178 12/08/2021 1010   MCV 85.5 12/08/2021 1010   MCH 26.8 12/08/2021 1010   MCHC 31.4 12/08/2021 1010   RDW 16.9 (H) 12/08/2021 1010   LYMPHSABS 1.3 12/08/2021 1010   MONOABS 0.4 12/08/2021 1010   EOSABS 0.2 12/08/2021 1010   BASOSABS 0.0 12/08/2021 1010      Latest Ref Rng & Units 12/08/2021   10:10 AM 10/26/2021    8:33 AM 08/18/2021    9:22 AM  BMP  Glucose 70 - 99 mg/dL 225  199  138   BUN 8 - 23 mg/dL '20  17  25   '$ Creatinine 0.44 - 1.00 mg/dL 1.46  1.38  1.60   Sodium 135 - 145 mmol/L 140  140  139   Potassium 3.5 - 5.1 mmol/L 3.7  3.4  3.6   Chloride 98 - 111 mmol/L 104  106  104   CO2 22 - 32 mmol/L '30  28  26   '$ Calcium 8.9 - 10.3 mg/dL 9.1  9.4  9.2    Chest imaging: CTA Chest 11/09/20 Cardiovascular: An extensive amount of intraluminal filling defects are seen within the bilateral pulmonary arteries and numerous bilateral upper lobe, right middle lobe and bilateral lower lobe branches. No saddle embolus is identified. Mild cardiomegaly is seen with associated right heart strain. No pericardial effusion.    Mediastinum/Nodes: No enlarged mediastinal, hilar, or axillary lymph nodes. Thyroid gland, trachea, and esophagus demonstrate no significant findings.   Lungs/Pleura: Lungs are clear. No pleural effusion or pneumothorax.   PFT:     No data to display          Labs:  Path:  Echo 02/17/21 LV EF 65-70%. Grade I diastolic dysfunction. RV function is normal and size is normal. LA mild to moderately dilated.   Echo 11/10/20: McConnell's sign noted. LV EF 55-60%. Severe LV hypertrophy. Grade I diastolic dysfunction. RV systolic function is severely reduced. RV size is mildly enlarged. Moderate elevated PASP. LA size is mildly dilated. Small pericardial effusion present.   Heart Catheterization:  Lower Extremity Doppler US 11/10/20 RIGHT:  - Findings consistent with acute deep vein thrombosis involving the right  posterior tibial veins.  - No cystic structure found in the popliteal fossa.     LEFT:  - Findings consistent with acute deep vein thrombosis involving the left  posterior tibial veins, and left peroneal veins.  - No cystic structure found in the popliteal fossa.     Assessment & Plan:   Acute saddle pulmonary embolism with acute cor pulmonale (HCC)  Acute deep vein thrombosis (DVT) of tibial vein of both lower extremities (HCC)  Discussion: Sandra Beasley is a 71 year old woman, never smoker with history of hypertension, DMII, CAD and hyperlipidemia who comes to pulmonary clinic for hospital follow up of pulmonary emboli and bilateral DVT.   She has been transitioned to prophylactic dose eliquis 2.'5mg'$  twice daily  by hematology. Her follow up echo shows normal RV function.   She can follow up as needed.  Freda Jackson, MD [facility] Office: [phone]   Current Outpatient Medications:    apixaban (ELIQUIS) 5 MG TABS tablet, Please continue with 2 tablets (10 mg) by mouth twice daily. On 11/19/2020 transition to 1 tablet (5 mg) twice  daily., Disp: 70 tablet, Rfl: 0   candesartan (ATACAND) 32 MG tablet, Take 32 mg by mouth daily., Disp: , Rfl:    carvedilol (COREG) 25 MG tablet, Take 25 mg by mouth 2 (two) times daily., Disp: , Rfl:    Cholecalciferol 1000 UNITS TBDP, Take 1,000 Units by mouth every morning. , Disp: , Rfl:    cyanocobalamin (,VITAMIN B-12,) 1000 MCG/ML injection, 1 mL, Disp: , Rfl:    folic acid (FOLVITE) 1 MG tablet, Take 1 tablet (1 mg total) by mouth daily., Disp: 30 tablet, Rfl: 6   glimepiride (AMARYL) 4 MG tablet, Take 4 mg by mouth daily before breakfast., Disp: , Rfl:    metFORMIN (GLUCOPHAGE) 500 MG tablet, Take 1,000 mg by mouth 2 (two) times daily with a meal., Disp: , Rfl:    metroNIDAZOLE (METROCREAM) 0.75 % cream, Apply 1 application topically 2 (two) times daily as needed (rosacea). Apply to face, Disp: , Rfl:    nitroGLYCERIN (NITROSTAT) 0.4 MG SL tablet, Place 1 tablet (0.4 mg total) under the tongue every 5 (five) minutes as needed for chest pain., Disp: 25 tablet, Rfl: 1   rosuvastatin (CRESTOR) 10 MG tablet, Take 10 mg by mouth daily., Disp: , Rfl: 4   triamcinolone cream (KENALOG) 0.1 %, Apply 1 application topically 2 (two) times daily as needed (rash/irritation)., Disp: , Rfl:    pantoprazole (PROTONIX) 40 MG tablet, Take 1 tablet (40 mg total) by mouth daily., Disp: 30 tablet, Rfl: 1

## 2021-12-23 NOTE — Patient Instructions (Addendum)
Continue eliquis 2.'5mg'$  twice daily  Your heart ultrasound has returned to normal  Follow up as needed in the future

## 2021-12-31 ENCOUNTER — Encounter: Payer: Self-pay | Admitting: Pulmonary Disease

## 2022-01-17 DIAGNOSIS — E538 Deficiency of other specified B group vitamins: Secondary | ICD-10-CM | POA: Diagnosis not present

## 2022-01-19 ENCOUNTER — Other Ambulatory Visit: Payer: Self-pay | Admitting: Physician Assistant

## 2022-01-19 ENCOUNTER — Inpatient Hospital Stay: Payer: Medicare HMO

## 2022-01-19 ENCOUNTER — Inpatient Hospital Stay: Payer: Medicare HMO | Attending: Physician Assistant | Admitting: Physician Assistant

## 2022-01-19 ENCOUNTER — Other Ambulatory Visit: Payer: Self-pay

## 2022-01-19 VITALS — BP 161/84 | HR 58 | Temp 97.0°F | Resp 18 | Ht 62.0 in | Wt 237.8 lb

## 2022-01-19 DIAGNOSIS — E1159 Type 2 diabetes mellitus with other circulatory complications: Secondary | ICD-10-CM | POA: Diagnosis not present

## 2022-01-19 DIAGNOSIS — D509 Iron deficiency anemia, unspecified: Secondary | ICD-10-CM | POA: Diagnosis not present

## 2022-01-19 DIAGNOSIS — Z7901 Long term (current) use of anticoagulants: Secondary | ICD-10-CM | POA: Diagnosis not present

## 2022-01-19 DIAGNOSIS — Z86711 Personal history of pulmonary embolism: Secondary | ICD-10-CM | POA: Insufficient documentation

## 2022-01-19 DIAGNOSIS — D508 Other iron deficiency anemias: Secondary | ICD-10-CM | POA: Diagnosis not present

## 2022-01-19 DIAGNOSIS — I10 Essential (primary) hypertension: Secondary | ICD-10-CM | POA: Insufficient documentation

## 2022-01-19 DIAGNOSIS — Z86718 Personal history of other venous thrombosis and embolism: Secondary | ICD-10-CM | POA: Insufficient documentation

## 2022-01-19 DIAGNOSIS — Z803 Family history of malignant neoplasm of breast: Secondary | ICD-10-CM | POA: Insufficient documentation

## 2022-01-19 DIAGNOSIS — E876 Hypokalemia: Secondary | ICD-10-CM | POA: Insufficient documentation

## 2022-01-19 DIAGNOSIS — Z90710 Acquired absence of both cervix and uterus: Secondary | ICD-10-CM | POA: Insufficient documentation

## 2022-01-19 DIAGNOSIS — E538 Deficiency of other specified B group vitamins: Secondary | ICD-10-CM | POA: Insufficient documentation

## 2022-01-19 DIAGNOSIS — E785 Hyperlipidemia, unspecified: Secondary | ICD-10-CM | POA: Diagnosis not present

## 2022-01-19 LAB — FERRITIN: Ferritin: 22 ng/mL (ref 11–307)

## 2022-01-19 LAB — CBC WITH DIFFERENTIAL (CANCER CENTER ONLY)
Abs Immature Granulocytes: 0.02 10*3/uL (ref 0.00–0.07)
Basophils Absolute: 0.1 10*3/uL (ref 0.0–0.1)
Basophils Relative: 2 %
Eosinophils Absolute: 0.2 10*3/uL (ref 0.0–0.5)
Eosinophils Relative: 3 %
HCT: 36.6 % (ref 36.0–46.0)
Hemoglobin: 12.1 g/dL (ref 12.0–15.0)
Immature Granulocytes: 0 %
Lymphocytes Relative: 26 %
Lymphs Abs: 1.4 10*3/uL (ref 0.7–4.0)
MCH: 28.6 pg (ref 26.0–34.0)
MCHC: 33.1 g/dL (ref 30.0–36.0)
MCV: 86.5 fL (ref 80.0–100.0)
Monocytes Absolute: 0.4 10*3/uL (ref 0.1–1.0)
Monocytes Relative: 7 %
Neutro Abs: 3.3 10*3/uL (ref 1.7–7.7)
Neutrophils Relative %: 62 %
Platelet Count: 170 10*3/uL (ref 150–400)
RBC: 4.23 MIL/uL (ref 3.87–5.11)
RDW: 14.3 % (ref 11.5–15.5)
WBC Count: 5.2 10*3/uL (ref 4.0–10.5)
nRBC: 0 % (ref 0.0–0.2)

## 2022-01-19 LAB — CMP (CANCER CENTER ONLY)
ALT: 11 U/L (ref 0–44)
AST: 9 U/L — ABNORMAL LOW (ref 15–41)
Albumin: 3.7 g/dL (ref 3.5–5.0)
Alkaline Phosphatase: 52 U/L (ref 38–126)
Anion gap: 6 (ref 5–15)
BUN: 17 mg/dL (ref 8–23)
CO2: 29 mmol/L (ref 22–32)
Calcium: 9.5 mg/dL (ref 8.9–10.3)
Chloride: 106 mmol/L (ref 98–111)
Creatinine: 1.35 mg/dL — ABNORMAL HIGH (ref 0.44–1.00)
GFR, Estimated: 41 mL/min — ABNORMAL LOW (ref >60)
Glucose, Bld: 257 mg/dL — ABNORMAL HIGH (ref 70–99)
Potassium: 3.1 mmol/L — ABNORMAL LOW (ref 3.5–5.1)
Sodium: 141 mmol/L (ref 135–145)
Total Bilirubin: 0.6 mg/dL (ref 0.3–1.2)
Total Protein: 6.3 g/dL — ABNORMAL LOW (ref 6.5–8.1)

## 2022-01-19 LAB — VITAMIN B12: Vitamin B-12: 2277 pg/mL — ABNORMAL HIGH (ref 180–914)

## 2022-01-19 LAB — IRON AND IRON BINDING CAPACITY (CC-WL,HP ONLY)
Iron: 46 ug/dL (ref 28–170)
Saturation Ratios: 15 % (ref 10.4–31.8)
TIBC: 298 ug/dL (ref 250–450)
UIBC: 252 ug/dL (ref 148–442)

## 2022-01-20 ENCOUNTER — Telehealth: Payer: Self-pay | Admitting: Physician Assistant

## 2022-01-20 ENCOUNTER — Telehealth: Payer: Self-pay

## 2022-01-20 ENCOUNTER — Encounter: Payer: Self-pay | Admitting: Physician Assistant

## 2022-01-20 MED ORDER — POTASSIUM CHLORIDE CRYS ER 20 MEQ PO TBCR: 20.0000 meq | EXTENDED_RELEASE_TABLET | Freq: Every day | ORAL | 0 refills | Status: DC

## 2022-01-20 NOTE — Telephone Encounter (Signed)
Pt advised of lab results and the need for 2 doses of IV iron.  She is aware scheduling will call after approval from her insurance.  Pt agreed with this plan and will return as recommended.

## 2022-01-20 NOTE — Progress Notes (Signed)
Hi, We will arrange an office visit for her. Thanks, Norberto Sorenson

## 2022-01-20 NOTE — Telephone Encounter (Signed)
Scheduled OV with patient for 02/21/22 at 2:30 pm with Dr. Fuller Plan. Address provided to patient & advised on when/where to go for appointment.

## 2022-01-20 NOTE — Telephone Encounter (Signed)
-----   Message from Lincoln Brigham, PA-C sent at 01/20/2022  6:39 AM EST ----- Please notify patient that iron levels are improving but still not at goal range so we will arrange IV venofer x 2 doses. I have reached out to GI to request a follow up as well. We will see her back in 3 months for lab check and 6 months for follow  up visit.

## 2022-01-20 NOTE — Telephone Encounter (Signed)
Called patient to schedule treatment. Left voicemail with appointment information and mailing reminders.

## 2022-01-20 NOTE — Progress Notes (Signed)
[place] [phone]   [phone]  PROGRESS NOTE  Patient Care Team: Lawerance Cruel, MD as PCP - General (Family Medicine) Leonie Man, MD as PCP - Cardiology (Cardiology)  Hematological/Oncological History -11/09/2020-11/13/2020: Admitted due to progressive dyspnea.  CT angiogram of the chest showed extensive bilateral pulmonary emboli with associated right heart strain.  Doppler ultrasound revealed acute DVT involving the left posterior tibial veins, left peroneal veins and right posterior tibial veins.  Patient was started on IV heparin drip and transition to Eliquis.    -05/17/2021: Establish care with Dede Query PA-C  -09/22/2021-10/20/2021: Received IV venofer 200 mg once a week x 5 doses  -12/15/2021: Received IV venofer 200 mg x 1 dose  CHIEF COMPLAINTS/PURPOSE OF CONSULTATION:  -H/O bilateral DVT and pulmonary emboli -Iron deficiency anemia/folate deficiency/vitamin B12 deficiency  HISTORY OF PRESENTING ILLNESS:  Sandra Beasley 71 y.o. female returns for a follow up for iron, B12 and folate deficiency. She is unaccompanied for this visit.  He was last seen on 10/26/2021.  In the interim she has received IV venofer x 1 dose.   On exam today,Sandra Beasley reports having great energy levels and she is able to do all her ADLs on her own. Her appetite and weight are unchanged. She denies nausea, vomiting or abdominal pain. Her bowel habits are unchanged. She denies easy bruising or signs of active bleeding. She denies fevers, chills, sweats, shortness of breath, chest pain or cough. She has no other complaints. Rest of the 10 point ROS is below  MEDICAL HISTORY:  Past Medical History:  Diagnosis Date   Adenomatous polyp of colon 09/2005   CAD S/P percutaneous coronary angioplasty - DES x2 in RCA 01/02/2013; 02/2013   a. 100% RPDA - Xience Xpedition DES 2.25 mm x 15 mm + 2.25 mm x 8 mm overlapping, mod- severe distal LAD and prox OM1  lesions.;b.  Myoview 02/2013 & Jan 2020: No ischemia or Infarction, Normal EF.   GERD (gastroesophageal reflux disease) 03/06/2013   Hyperlipidemia associated with type 2 diabetes mellitus ([place])    Hypertension    Obesity    STEMI (ST elevation myocardial infarction) ([place]) 01/02/13   --> PCI; EF 60 and 65%. Gr 1 DD & No regional WMA, Otherwise relatively normal.   Type 2 diabetes mellitus with circulatory disorder (HCC)    Vitamin D deficiency     SURGICAL HISTORY: Past Surgical History:  Procedure Laterality Date   ABDOMINAL HYSTERECTOMY     CORONARY ANGIOPLASTY WITH STENT PLACEMENT  01/02/2013    PCI-RCA: Xience Xpedition 2.25 mm x 15 mm, 2.25 mm x 8 mm (2.4 mm)   LAPAROSCOPIC GASTRIC BANDING  2006   LAPAROSCOPIC HYSTERECTOMY  1996   LEFT HEART CATHETERIZATION WITH CORONARY ANGIOGRAM N/A 01/02/2013   Procedure: LEFT HEART CATHETERIZATION WITH CORONARY ANGIOGRAM;  Surgeon: Leonie Man, MD;  Location: [facility] CATH LAB;: Inferior STEMI: 100% PDA; mid-distal LAD beyond D1 (small caliber) 80%.  Ostial D1 40%.  Lateral OM 1 with 70-80% stenosis.  Large branching ramus intermedius with OM and DIAG branch branches.   NM MYOVIEW LTD  01/'15; 1/'20   a) EF 60-65%.  Normal wall motion.  No ischemia or infarction.; b) EF 70 to 75%.  No EKG changes.  No evidence of ischemia or infarction.:   TONSILLECTOMY  1966   TRANSTHORACIC ECHOCARDIOGRAM  01/04/2013   (Post inferior STEMI) EF 60 and 65%. Grade 1 diastolic dysfunction with no regional wall motion abnormalities. Otherwise  relatively normal.    SOCIAL HISTORY: Social History   Socioeconomic History   Marital status: Divorced    Spouse name: Not on file   Number of children: 3   Years of education: Not on file   Highest education level: Not on file  Occupational History   Occupation: Teacher  Tobacco Use   Smoking status: Never   Smokeless tobacco: Never  Substance and Sexual Activity   Alcohol use: No   Drug use: No   Sexual activity:  Not on file  Other Topics Concern   Not on file  Social History Narrative   She is a divorced nonsmoker who works for [employer]. She is currently now in cardiac rehabilitation. Has changed her dietary habits to a Vegan lifestyle.   Social Determinants of Health   Financial Resource Strain: Not on file  Food Insecurity: Not on file  Transportation Needs: Not on file  Physical Activity: Not on file  Stress: Not on file  Social Connections: Not on file  Intimate Partner Violence: Not on file    FAMILY HISTORY: Family History  Adopted: Yes  Problem Relation Age of Onset   Breast cancer Maternal Aunt    Diabetes Maternal Aunt    Heart disease Maternal Aunt    Diabetes Maternal Uncle    Diabetes gravidarum Maternal Uncle    Colon cancer Neg Hx    Stomach cancer Neg Hx    Rectal cancer Neg Hx     ALLERGIES:  is allergic to ace inhibitors, actos [pioglitazone], other, and statins.  MEDICATIONS:  Current Outpatient Medications  Medication Sig Dispense Refill   apixaban (ELIQUIS) 5 MG TABS tablet Please continue with 2 tablets (10 mg) by mouth twice daily. On 11/19/2020 transition to 1 tablet (5 mg) twice daily. 70 tablet 0   candesartan (ATACAND) 32 MG tablet Take 32 mg by mouth daily.     carvedilol (COREG) 25 MG tablet Take 25 mg by mouth 2 (two) times daily.     Cholecalciferol 1000 UNITS TBDP Take 1,000 Units by mouth every morning.      cyanocobalamin (,VITAMIN B-12,) 1000 MCG/ML injection 1 mL     folic acid (FOLVITE) 1 MG tablet Take 1 tablet (1 mg total) by mouth daily. 30 tablet 6   glimepiride (AMARYL) 4 MG tablet Take 4 mg by mouth daily before breakfast.     metFORMIN (GLUCOPHAGE) 500 MG tablet Take 1,500 mg by mouth 2 (two) times daily with a meal. Taking 2 in am and 1 in pm     metroNIDAZOLE (METROCREAM) 0.75 % cream Apply 1 application topically 2 (two) times daily as needed (rosacea). Apply to face     nitroGLYCERIN (NITROSTAT) 0.4 MG SL tablet Place 1  tablet (0.4 mg total) under the tongue every 5 (five) minutes as needed for chest pain. 25 tablet 1   rosuvastatin (CRESTOR) 10 MG tablet Take 10 mg by mouth daily.  4   triamcinolone cream (KENALOG) 0.1 % Apply 1 application topically 2 (two) times daily as needed (rash/irritation).     pantoprazole (PROTONIX) 40 MG tablet Take 1 tablet (40 mg total) by mouth daily. 30 tablet 1   No current facility-administered medications for this visit.    REVIEW OF SYSTEMS:   Constitutional: ( - ) fevers, ( - )  chills , ( - ) night sweats Eyes: ( - ) blurriness of vision, ( - ) double vision, ( - ) watery eyes Ears, nose, mouth, throat, and face: ( - )  mucositis, ( - ) sore throat Respiratory: ( - ) cough, ( - ) dyspnea, ( - ) wheezes Cardiovascular: ( - ) palpitation, ( - ) chest discomfort, ( - ) lower extremity swelling Gastrointestinal:  ( - ) nausea, ( - ) heartburn, ( - ) change in bowel habits Skin: ( - ) abnormal skin rashes Lymphatics: ( - ) new lymphadenopathy, ( - ) easy bruising Neurological: ( - ) numbness, ( - ) tingling, ( - ) new weaknesses Behavioral/Psych: ( - ) mood change, ( - ) new changes  All other systems were reviewed with the patient and are negative.  PHYSICAL EXAMINATION: ECOG PERFORMANCE STATUS: 0 - Asymptomatic  Vitals:   01/19/22 1042  BP: (!) 161/84  Pulse: (!) 58  Resp: 18  Temp: (!) 97 F (36.1 C)  SpO2: 98%   Filed Weights   01/19/22 1042  Weight: 237 lb 12.8 oz (107.9 kg)    GENERAL: well appearing female in NAD  SKIN: skin color, texture, turgor are normal, no rashes or significant lesions EYES: conjunctiva are pink and non-injected, sclera clear OROPHARYNX: no exudate, no erythema; lips, buccal mucosa, and tongue normal  LUNGS: clear to auscultation and percussion with normal breathing effort HEART: regular rate & rhythm and no murmurs and no lower extremity edema Musculoskeletal: no cyanosis of digits and no clubbing  PSYCH: alert & oriented x  3, fluent speech NEURO: no focal motor/sensory deficits  LABORATORY DATA:  I have reviewed the data as listed    Latest Ref Rng & Units 01/19/2022   10:03 AM 12/08/2021   10:10 AM 10/26/2021    8:33 AM  CBC  WBC 4.0 - 10.5 K/uL 5.2  5.2  4.8   Hemoglobin 12.0 - 15.0 g/dL 12.1  11.3  10.5   Hematocrit 36.0 - 46.0 % 36.6  36.0  33.8   Platelets 150 - 400 K/uL 170  178  172        Latest Ref Rng & Units 01/19/2022   10:03 AM 12/08/2021   10:10 AM 10/26/2021    8:33 AM  CMP  Glucose 70 - 99 mg/dL 257  225  199   BUN 8 - 23 mg/dL '17  20  17   '$ Creatinine 0.44 - 1.00 mg/dL 1.35  1.46  1.38   Sodium 135 - 145 mmol/L 141  140  140   Potassium 3.5 - 5.1 mmol/L 3.1  3.7  3.4   Chloride 98 - 111 mmol/L 106  104  106   CO2 22 - 32 mmol/L '29  30  28   '$ Calcium 8.9 - 10.3 mg/dL 9.5  9.1  9.4   Total Protein 6.5 - 8.1 g/dL 6.3  7.0  6.4   Total Bilirubin 0.3 - 1.2 mg/dL 0.6  0.6  0.6   Alkaline Phos 38 - 126 U/L 52  51  49   AST 15 - 41 U/L '9  9  10   '$ ALT 0 - 44 U/L '11  9  8     '$ ASSESSMENT & PLAN Sandra Beasley is a 71 y.o. female returns for a follow up for history of DVT/PE and nutritional deficiencies.    #H/O bilateral lower extremity DVT and bilateral pulmonary emboli with right heart strain: --Diagnosed in October 2022 with CTA that showed extensive bilateral pulmonary emboli with associated right heart strain. Doppler US showed acute DVT involving the left posterior tibial veins, left peroneal veins and right posterior tibial veins. --Currently on  Eliquis 5 mg PO twice daily, tolerating well without any toxicities. Okay to transition to maintenance dose of 2.5 mg twice daily.  --Since there is no definitive provoking factor and due to the extensive thrombotic presentation, the recommendation is indefinite anticoagulation.  --Labs from 05/17/2021 ruled our antiphospholipid syndrome.   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 Need to rule out GI bleed so recommend follow up with GI --Recieves IM vitamin B12 injections q 4 weeks through PCP --Takes folic acid 1 mg once a day --Received IV venofer 200 mg once weekly x 1 dose on 12/15/2021.  --Labs today show anemia has resolved with Hgb 12.1, MCV 86.5. Iron panel shows that ferritin is 22, iron 46, saturation 15%.  --Recommend IV venofer 200 mg x 2 doses to help bolster levels --RTC in 3 months with labs and 6 months with labs/follow up.   #Hypokalemia --Patient reports that she takes HCTZ as part of her HTN medication. --Sent two week supply of potassium chloride 20 mg once daily --Advised to follow up with PCP by two weeks.     No orders of the defined types were placed in this encounter.   All questions were answered. The patient knows to call the clinic with any problems, questions or concerns.  I have spent a total of 30 minutes minutes of face-to-face and non-face-to-face time, preparing to see the patient, performing a medically appropriate examination, counseling and educating the patient, ordering tests, documenting clinical information in the electronic health record, and care coordination.   Dede Query, PA-C Department of Hematology/Oncology [place] [facility] Phone: [phone]

## 2022-01-28 ENCOUNTER — Other Ambulatory Visit: Payer: Self-pay

## 2022-01-28 ENCOUNTER — Inpatient Hospital Stay: Payer: Medicare HMO

## 2022-01-28 VITALS — BP 160/79 | HR 51 | Temp 98.2°F | Resp 18

## 2022-01-28 DIAGNOSIS — D509 Iron deficiency anemia, unspecified: Secondary | ICD-10-CM | POA: Diagnosis not present

## 2022-01-28 DIAGNOSIS — D508 Other iron deficiency anemias: Secondary | ICD-10-CM

## 2022-01-28 DIAGNOSIS — Z90710 Acquired absence of both cervix and uterus: Secondary | ICD-10-CM | POA: Diagnosis not present

## 2022-01-28 DIAGNOSIS — I10 Essential (primary) hypertension: Secondary | ICD-10-CM | POA: Diagnosis not present

## 2022-01-28 DIAGNOSIS — Z803 Family history of malignant neoplasm of breast: Secondary | ICD-10-CM | POA: Diagnosis not present

## 2022-01-28 DIAGNOSIS — Z86718 Personal history of other venous thrombosis and embolism: Secondary | ICD-10-CM | POA: Diagnosis not present

## 2022-01-28 DIAGNOSIS — E1159 Type 2 diabetes mellitus with other circulatory complications: Secondary | ICD-10-CM | POA: Diagnosis not present

## 2022-01-28 DIAGNOSIS — E538 Deficiency of other specified B group vitamins: Secondary | ICD-10-CM | POA: Diagnosis not present

## 2022-01-28 DIAGNOSIS — E876 Hypokalemia: Secondary | ICD-10-CM | POA: Diagnosis not present

## 2022-01-28 DIAGNOSIS — Z7901 Long term (current) use of anticoagulants: Secondary | ICD-10-CM | POA: Diagnosis not present

## 2022-01-28 DIAGNOSIS — E785 Hyperlipidemia, unspecified: Secondary | ICD-10-CM | POA: Diagnosis not present

## 2022-01-28 DIAGNOSIS — Z86711 Personal history of pulmonary embolism: Secondary | ICD-10-CM | POA: Diagnosis not present

## 2022-01-28 MED ORDER — SODIUM CHLORIDE 0.9 % IV SOLN: Freq: Once | INTRAVENOUS | Status: AC

## 2022-01-28 MED ORDER — SODIUM CHLORIDE 0.9 % IV SOLN
200.0000 mg | Freq: Once | INTRAVENOUS | Status: AC
  Administered 2022-01-28: 200 mg via INTRAVENOUS
  Filled 2022-01-28: qty 200

## 2022-01-28 NOTE — Patient Instructions (Signed)

## 2022-01-28 NOTE — Progress Notes (Signed)
Pt declined to stay for 54mn obsv.  VSS stable.  Pt discharge to lobby in stable condition

## 2022-02-04 ENCOUNTER — Inpatient Hospital Stay: Payer: Medicare HMO

## 2022-02-09 ENCOUNTER — Other Ambulatory Visit: Payer: Self-pay

## 2022-02-09 ENCOUNTER — Inpatient Hospital Stay: Payer: Medicare HMO | Attending: Physician Assistant

## 2022-02-09 VITALS — BP 140/68 | HR 54 | Temp 98.2°F | Resp 18

## 2022-02-09 DIAGNOSIS — D508 Other iron deficiency anemias: Secondary | ICD-10-CM

## 2022-02-09 DIAGNOSIS — D509 Iron deficiency anemia, unspecified: Secondary | ICD-10-CM | POA: Insufficient documentation

## 2022-02-09 MED ORDER — SODIUM CHLORIDE 0.9 % IV SOLN
200.0000 mg | Freq: Once | INTRAVENOUS | Status: AC
  Administered 2022-02-09: 200 mg via INTRAVENOUS
  Filled 2022-02-09: qty 200

## 2022-02-09 MED ORDER — SODIUM CHLORIDE 0.9 % IV SOLN: Freq: Once | INTRAVENOUS | Status: AC

## 2022-02-09 NOTE — Patient Instructions (Signed)

## 2022-02-18 DIAGNOSIS — E538 Deficiency of other specified B group vitamins: Secondary | ICD-10-CM | POA: Diagnosis not present

## 2022-02-21 ENCOUNTER — Ambulatory Visit: Payer: Medicare HMO | Admitting: Gastroenterology

## 2022-02-23 ENCOUNTER — Ambulatory Visit: Payer: Medicare HMO | Attending: Cardiology | Admitting: Cardiology

## 2022-02-23 ENCOUNTER — Encounter: Payer: Self-pay | Admitting: Cardiology

## 2022-02-23 VITALS — BP 136/84 | HR 59 | Ht 62.0 in | Wt 239.6 lb

## 2022-02-23 DIAGNOSIS — E1169 Type 2 diabetes mellitus with other specified complication: Secondary | ICD-10-CM

## 2022-02-23 DIAGNOSIS — Z7901 Long term (current) use of anticoagulants: Secondary | ICD-10-CM | POA: Diagnosis not present

## 2022-02-23 DIAGNOSIS — I2119 ST elevation (STEMI) myocardial infarction involving other coronary artery of inferior wall: Secondary | ICD-10-CM

## 2022-02-23 DIAGNOSIS — I2699 Other pulmonary embolism without acute cor pulmonale: Secondary | ICD-10-CM

## 2022-02-23 DIAGNOSIS — I2510 Atherosclerotic heart disease of native coronary artery without angina pectoris: Secondary | ICD-10-CM | POA: Diagnosis not present

## 2022-02-23 DIAGNOSIS — I10 Essential (primary) hypertension: Secondary | ICD-10-CM | POA: Diagnosis not present

## 2022-02-23 DIAGNOSIS — E785 Hyperlipidemia, unspecified: Secondary | ICD-10-CM

## 2022-02-23 DIAGNOSIS — D508 Other iron deficiency anemias: Secondary | ICD-10-CM

## 2022-02-23 DIAGNOSIS — Z9861 Coronary angioplasty status: Secondary | ICD-10-CM | POA: Diagnosis not present

## 2022-02-23 NOTE — Progress Notes (Signed)
Primary Care Provider: Lawerance Cruel, MD [place] Cardiologist: Glenetta Hew, MD Electrophysiologist: None  Clinic Note: Chief Complaint  Patient presents with   Follow-up    14-monthfollow-up.  Doing well.   Coronary Artery Disease    No angina   ===================================  ASSESSMENT/PLAN   Problem List Items Addressed This Visit       Cardiology Problems   ST segment elevation myocardial infarction (STEMI) of inferolateral wall, subsequent episode of care ([facility] (Chronic)    Now just over 9 years out from MI with RCA PCI.  Has had 2 follow-up Myoview stress test that are negative to evaluate her other notable disease.  EF is stable with no CHF symptoms and no current angina symptoms.  On stable regimen.      Relevant Medications   candesartan-hydrochlorothiazide (ATACAND HCT) 32-12.5 MG tablet   CAD S/P percutaneous coronary angioplasty - DES x2 in RCA - Primary (Chronic)    Just over 9 years from her inferior STEMI with RCA PCI.  She did have residual disease in the distal LAD and proximal OM1 however she had to follow-up Myoview's in 2015 and 2020 that were nonischemic.  As such, we will hold off on further ischemic evaluation until symptoms warrant.  No longer on Thienopyridine or aspirin-stopped aspirin last visit-because she is on Eliquis long-term.  With concerns for anemia, aspirin was finally stopped.  She remains on Eliquis.  She is doing well on stable dose of carvedilol and Atacand along with low-dose rosuvastatin for hyperlipidemia.  If she would qualify based on having diabetes and CAD, I think she would benefit from GLP-1 agonist -> will defer to PCP.      Relevant Medications   candesartan-hydrochlorothiazide (ATACAND HCT) 32-12.5 MG tablet   Other Relevant Orders   EKG 12-Lead (Completed)   Hyperlipidemia associated with type 2 diabetes mellitus ([place] (Chronic)    Her last lipids were checked just over a year ago looked  pretty normal..  She should be getting them checked soon.  For now continue current dose of rosuvastatin and monitor closely.  A1c 6.2 on combination of metformin and glipizide.  I truthfully think that she would benefit from a GLP-1 agonist partially because of weight loss benefit as well as improvement in A1c.  Will defer to PCP, but I think Mounjaro or Ozempic would be a good option if covered by insurance.        Relevant Medications   candesartan-hydrochlorothiazide (ATACAND HCT) 32-12.5 MG tablet   Essential hypertension (Chronic)    BP has been somewhat labile, but looks good today.  She had been on HCTZ along with the Atacand but that was discontinued by PCP.  Follow BP now and see how she does as she is no longer on the HCTZ or amlodipine (had previously been on 5 mg amlodipine)..      Relevant Medications   candesartan-hydrochlorothiazide (ATACAND HCT) 32-12.5 MG tablet   Other Relevant Orders   EKG 12-Lead (Completed)   PE (pulmonary thromboembolism) (HCC) (Chronic)    History of bilateral PEs which were presumed to be triggered by prolonged flight.  However she remains on anticoagulation.  She had initial RV myopathy as result of PE which has resolved by follow-up echocardiogram.  The initial plan was for her to reduce to 2.5 mg twice daily but she remains off 5.  I think will be fine for her to hold Eliquis if necessary for GI procedures.  Holding it for 2 days  to be prudent.  If there is any concern of significant bleeding issues, would also recommend holding it for 2 to 3 days.      Relevant Medications   candesartan-hydrochlorothiazide (ATACAND HCT) 32-12.5 MG tablet     Other   Severe obesity (BMI >= 40) (HCC) (Chronic)    Her weight goes up and down.  She had bariatric surgery with lap band years ago but has not had much benefit.  As noted, I wonder if she would potentially benefit from GLP-1 agonist  We again talked about the importance of dietary modification and  exercise.  Unfortunately, she has been very sedentary.  I stressed trying to get back more active, consider doing water aerobics.      Iron deficiency anemia (Chronic)    Chronic iron deficiency anemia that was probably the cause of her fatigue symptoms.  Most recent hemoglobin was pretty stable.  I really think that the anemia is probably related to poor iron uptake with her Lap-Band.  Question if she would potentially benefit from having the Lap-Band removed since it has not helped with her weight loss..  She has pending GI evaluation but with both upper and lower GI scopes.  Should be fine to hold Eliquis for these procedures-hold 2 days prior to maybe 1 day postoperative if biopsies are taken.      Current use of long term anticoagulation (Chronic)    Has been on long-term anticoagulation with Eliquis since her PEs.  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  I deferred this to pulmonary medicine and heme-onc.  She is pending GI evaluation with endoscopy.   Although I have deferred management of anticoagulation to either heme-onc or pulmonary medicine, I can see no reason for her not to hold Eliquis 2 to 3 days preop and 1 day postop from endoscopy.       ===================================  HPI:    Sandra Beasley is a 72 y.o. female with a PMH below who presents today for delayed 39-monthfollow-up. She returns today at the request of RLawerance Cruel MD.  Pertinent PMH : CAD (Inferior STEMI 2014) => 2 overlapping DES to distal RCA-PDA, medical management of OM1 and LAD lesion),  Morbid Obesity s/p lap band,  CRFs: HTN, HLD, DM2 H/o Bilateral PE-November 2022 on maintenance dose Eliquis  Sandra BENEKEwas last seen on Jun 28, 2021.  She was doing very well overall.  [facility] because of dyspnea and leg fatigue.  More like  fatigue and overall energy level.  She noted that she had gotten somewhat deconditioned/out of shape recovering from her PE and had not gotten back to the level of activity.  Still noting some overexertion related dyspnea.  No chest pain or pressure.  Stable lower extremity edema but no PND orthopnea.  No tachycardia symptoms.  No syncope or near syncope.  BP has been doing better. I ordered ABIs and TBI's with lower extremity arterial duplexes.  Recent Hospitalizations: None  Reviewed  CV studies:    The following studies were reviewed today: (if available, images/films reviewed: From Epic Chart or Care Everywhere) June 2013: Lower extremity arterial Dopplers-ABIs and TBI's.  Both normal.:  Interval History:   Sandra MARANOreturns here today for follow-up doing pretty well.  She cannot believe that her heart attack was just over 9 years  ago.  She is doing well from cardiac standpoint.  Is still teaching and enjoying it currently.  She is not having any angina symptoms of chest pain or pressure at rest or exertion.  No CHF symptoms of PND orthopnea or edema.    She was noticing a lot of fatigue and was finally found to have iron deficiency anemia.  She has been treated with Feraheme infusions and feels much better.  Almost back to baseline now.  She says her energy level is better and is no longer having the significant fatigue, dyspnea symptoms she was having before.  She actually says the leg fatigue is also pretty much resolved.She has pending GI evaluation with upper and lower GI to reassess for bleeding..  She also notes that with the improvement of anemia, her heart rate is improving.  No more palpitations.  CV Review of Symptoms (Summary): positive for - exertional dyspnea with overexertion but not with routine activity.  Notably improved.  Trivial edema. negative for - chest pain, irregular heartbeat, orthopnea, palpitations, paroxysmal nocturnal dyspnea, rapid heart rate, shortness of  breath, or syncope or near syncope, TIA/or suspect claudication.  REVIEWED OF SYSTEMS   Review of Systems  Constitutional:  Negative for malaise/fatigue (Much improved after treatment of anemia.) and weight loss.  HENT:  Negative for congestion.   Respiratory:  Negative for cough and shortness of breath (Stable baseline exertional dyspnea but much improved.-Back to what it was a year or 2 ago.).   Cardiovascular:  Positive for leg swelling (Trivial end of day).  Gastrointestinal:  Negative for blood in stool, diarrhea, heartburn and melena.  Genitourinary:  Negative for dysuria and hematuria.  Musculoskeletal:  Positive for joint pain. Negative for falls.  Neurological:  Negative for dizziness and focal weakness.  Psychiatric/Behavioral: Negative.      I have reviewed and (if needed) personally updated the patient's problem list, medications, allergies, past medical and surgical history, social and family history.   PAST MEDICAL HISTORY   Past Medical History:  Diagnosis Date   Adenomatous polyp of colon 09/2005   Bilateral pulmonary embolism ([employer]) 11/09/2020   PE protocol CTA 11/09/2020: Extensive bilateral pulmonary artery thrombus within numerous bilateral upper lobe, right middle lobe and bilateral lower lobe branches. No saddle PE. Mild cardiomegaly with right-sided heart strain.  [place] signs seen on TTE with severely reduced RV function, IVS flattening in S&D (RV P & Vol Overload) - PAP ~ 57 mmHg. IAS Bowel to L - high RAP. + McConnell Sign   CAD S/P percutaneous coronary angioplasty - DES x2 in RCA 01/02/2013; 02/2013   a. 100% RPDA - Xience Xpedition DES 2.25 mm x 15 mm + 2.25 mm x 8 mm overlapping, mod- severe distal LAD and prox OM1 lesions.;b.  Myoview 02/2013 & Jan 2020: No ischemia or Infarction, Normal EF.   DVT of lower extremity, bilateral ([place]) 11/2020   Presented with bilateral PEs.  Noted to have bilateral popliteal vein DVTs-follow on a trip to and from [place].   GERD  (gastroesophageal reflux disease) 03/06/2013   Hiatal hernia without gangrene and obstruction 11/2020   CTA of the chest showed: Moderate size hiatal hernia with evidence of prior Lap-Band. Also noted cholelithiasis   Hyperlipidemia associated with type 2 diabetes mellitus ([place])    Hypertension    Obesity    STEMI (ST elevation myocardial infarction) ([place]) 01/02/2013   --> PCI; EF 60 and 65%. Gr 1 DD & No regional WMA, Otherwise relatively normal.  Type 2 diabetes mellitus with circulatory disorder (HCC)    Vitamin D deficiency    PAST SURGICAL HISTORY   Past Surgical History:  Procedure Laterality Date   ABDOMINAL HYSTERECTOMY     CORONARY ANGIOPLASTY WITH STENT PLACEMENT  01/02/2013   PCI-RCA: Xience Xpedition 2.25 mm x 15 mm, 2.25 mm x 8 mm (2.4 mm)   LAPAROSCOPIC GASTRIC BANDING  2006   LAPAROSCOPIC HYSTERECTOMY  1996   LEFT HEART CATHETERIZATION WITH CORONARY ANGIOGRAM N/A 01/02/2013   Procedure: LEFT HEART CATHETERIZATION WITH CORONARY ANGIOGRAM;  Surgeon: Leonie Man, MD;  Location: [facility] CATH LAB;: Inferior STEMI: 100% PDA; mid-distal LAD beyond D1 (small caliber) 80%.  Ostial D1 40%.  Lateral OM 1 with 70-80% stenosis.  Large branching ramus intermedius with OM and DIAG branch branches.   NM MYOVIEW LTD  01/'15; 1/'20   a) EF 60-65%.  Normal wall motion.  No ischemia or infarction.; b) EF 70 to 75%.  No EKG changes.  No evidence of ischemia or infarction.:   TONSILLECTOMY  1966   TRANSTHORACIC ECHOCARDIOGRAM  01/04/2013   (Post inferior STEMI) EF 60 and 65%. Grade 1 diastolic dysfunction with no regional wall motion abnormalities. Otherwise relatively normal.   TRANSTHORACIC ECHOCARDIOGRAM  11/10/2020   (In setting of PE) EF 55 to 60%.  No RWMA.  GR 1 DD.  IVS flattening in systole and diastole consistent with RV pressure volume overload.  Severely reduced RV function with mildly elevated PAP roughly 57 mmHg.  Normal atrial sizes, I-S bowing to the left suggesting high RAP.   McConnell sign also noted suggesting a pulmonary embolus.   TRANSTHORACIC ECHOCARDIOGRAM  02/23/2021   3 months post PE: EF 65 to 70%.  No RWMA.  GR 1 DD with moderate LA dilation.  Unable to assess PAP.  Normal aortic and mitral valves.  Ascending Aorta measured at 37 mmHg. Comparison(s): 11/10/20 EF 55-60%. PA pressure 38mHg    Immunization History  Administered Date(s) Administered   PFIZER(Purple Top)SARS-COV-2 Vaccination 03/16/2019, 04/10/2019    MEDICATIONS/ALLERGIES   Current Meds  Medication Sig   apixaban (ELIQUIS) 5 MG TABS tablet Please continue with 2 tablets (10 mg) by mouth twice daily. On 11/19/2020 transition to 1 tablet (5 mg) twice daily.   candesartan-hydrochlorothiazide (ATACAND HCT) 32-12.5 MG tablet Take 1 tablet by mouth daily.   carvedilol (COREG) 25 MG tablet Take 25 mg by mouth 2 (two) times daily.   Cholecalciferol 1000 UNITS TBDP Take 1,000 Units by mouth every morning.    cyanocobalamin (,VITAMIN B-12,) 1000 MCG/ML injection 1 mL   folic acid (FOLVITE) 1 MG tablet Take 1 tablet (1 mg total) by mouth daily.   glimepiride (AMARYL) 4 MG tablet Take 4 mg by mouth daily before breakfast.   metFORMIN (GLUCOPHAGE) 500 MG tablet Take 1,500 mg by mouth 2 (two) times daily with a meal. Taking 2 in am and 1 in pm   metroNIDAZOLE (METROCREAM) 0.75 % cream Apply 1 application topically 2 (two) times daily as needed (rosacea). Apply to face   nitroGLYCERIN (NITROSTAT) 0.4 MG SL tablet Place 1 tablet (0.4 mg total) under the tongue every 5 (five) minutes as needed for chest pain.   potassium chloride SA (KLOR-CON M) 20 MEQ tablet Take 1 tablet (20 mEq total) by mouth daily. (Patient not taking: Reported on 02/28/2022)   rosuvastatin (CRESTOR) 10 MG tablet Take 10 mg by mouth daily.   triamcinolone cream (KENALOG) 0.1 % Apply 1 application topically 2 (two) times daily as  needed (rash/irritation).    Allergies  Allergen Reactions   Ace Inhibitors Cough   Actos  [Pioglitazone] Other (See Comments)    Flu like symptoms   Other Cough    Spices make pt cough, sneeze, eyes water   Statins     Can take Crestor    SOCIAL HISTORY/FAMILY HISTORY   Reviewed in Epic:  Pertinent findings:  Social History   Tobacco Use   Smoking status: Never   Smokeless tobacco: Never  Substance Use Topics   Alcohol use: No   Drug use: No   Social History   Social History Narrative   She is a divorced nonsmoker who works for [employer]. She is currently now in cardiac rehabilitation. Has changed her dietary habits to a Vegan lifestyle.    OBJCTIVE -PE, EKG, labs   Wt Readings from Last 3 Encounters:  02/28/22 240 lb (108.9 kg)  02/23/22 239 lb 9.6 oz (108.7 kg)  01/19/22 237 lb 12.8 oz (107.9 kg)    Physical Exam: BP 136/84   Pulse (!) 59   Ht '5\' 2"'$  (1.575 m)   Wt 239 lb 9.6 oz (108.7 kg)   SpO2 94%   BMI 43.82 kg/m  Physical Exam Vitals reviewed.  Constitutional:      General: She is not in acute distress.    Appearance: Normal appearance. She is not ill-appearing or toxic-appearing.     Comments: Morbidly obese but well-groomed.  HENT:     Head: Normocephalic and atraumatic.  Neck:     Vascular: No carotid bruit or JVD.  Cardiovascular:     Rate and Rhythm: Normal rate and regular rhythm. Occasional Extrasystoles are present.    Chest Wall: PMI is not displaced.     Pulses: Normal pulses.     Heart sounds: S1 normal and S2 normal. Heart sounds are distant. No murmur heard.    No friction rub. No gallop.  Pulmonary:     Effort: Pulmonary effort is normal. No respiratory distress.     Breath sounds: Normal breath sounds. No wheezing, rhonchi or rales.  Chest:     Chest wall: No tenderness.  Abdominal:     Comments: Obese.  Difficult assess HSM.  Soft, NT/ND STEMI BS.  No HSM.  Musculoskeletal:        General: Swelling (Trivial swelling in the ankles.) present.     Cervical back: Normal range of motion and neck supple.  Skin:     General: Skin is warm and dry.     Coloration: Skin is not pale.  Neurological:     General: No focal deficit present.     Mental Status: She is alert and oriented to person, place, and time.  Psychiatric:        Mood and Affect: Mood normal.        Behavior: Behavior normal.        Thought Content: Thought content normal.        Judgment: Judgment normal.     Comments: She is in good spirits.  Smiling.  Happy.      Adult ECG Report  Rate: 59;  Rhythm: normal sinus rhythm and 1 AVB ; normal axis, intervals durations.  Nonspecific ST and T wave changes.  Narrative Interpretation: Stable  Recent Labs: Due for labs soon by PCP. 04/26/2021: TC 140, TG 136, HDL 63, LDL 53.  TSH 1.47. 11/01/2021: A1c 6.2%  Lab Results  Component Value Date   CREATININE 1.35 (H) 01/19/2022  BUN 17 01/19/2022   NA 141 01/19/2022   K 3.1 (L) 01/19/2022   CL 106 01/19/2022   CO2 29 01/19/2022      Latest Ref Rng & Units 01/19/2022   10:03 AM 12/08/2021   10:10 AM 10/26/2021    8:33 AM  CBC  WBC 4.0 - 10.5 K/uL 5.2  5.2  4.8   Hemoglobin 12.0 - 15.0 g/dL 12.1  11.3  10.5   Hematocrit 36.0 - 46.0 % 36.6  36.0  33.8   Platelets 150 - 400 K/uL 170  178  172     ================================================== I spent a total of 37 minutes with the patient spent in direct patient consultation.  Additional time spent with chart review  / charting (studies, outside notes, etc): 26 min Total Time: 63 min  Current medicines are reviewed at length with the patient today.  (+/- concerns) none  Notice: This dictation was prepared with Dragon dictation along with smart phrase technology. Any transcriptional errors that result from this process are unintentional and may not be corrected upon review.  Studies Ordered:   Orders Placed This Encounter  Procedures   EKG 12-Lead   No orders of the defined types were placed in this encounter.   Patient Instructions / Medication Changes & Studies &  Tests Ordered   Patient Instructions  Medication Instructions:   Recommend  your primary to change  your medication -   stop taking Hydrochlorothiazide  and just take the Candesartan   Restart taking Amlodipine at a lower dose.   *If you need a refill on your cardiac medications before your next appointment, please call your pharmacy*   Lab Work:  Please have your primary to send a copy of labs    If you have labs (blood work) drawn today and your tests are completely normal, you will receive your results only by: [place] (if you have MyChart) OR A paper copy in the mail If you have any lab test that is abnormal or we need to change your treatment, we will call you to review the results.   Testing/Procedures: Not needed   Follow-Up: At [facility], you and your health needs are our priority.  As part of our continuing mission to provide you with exceptional heart care, we have created designated Provider Care Teams.  These Care Teams include your primary Cardiologist (physician) and Advanced Practice Providers (APPs -  Physician Assistants and Nurse Practitioners) who all work together to provide you with the care you need, when you need it.     Your next appointment:   12 month(s)  The format for your next appointment:   In Person  Provider:   Glenetta Hew, MD    Other Instructions      Leonie Man, MD, MS Glenetta Hew, M.D., M.S. Interventional Cardiologist  [place]  Pager # [phone] Phone # [phone] [address]. [place], [place] [address]   Thank you for choosing [facility] [place]!!

## 2022-02-23 NOTE — Patient Instructions (Signed)
Medication Instructions:   Recommend  your primary to change  your medication -   stop taking Hydrochlorothiazide  and just take the Candesartan   Restart taking Amlodipine at a lower dose.   *If you need a refill on your cardiac medications before your next appointment, please call your pharmacy*   Lab Work:  Please have your primary to send a copy of labs    If you have labs (blood work) drawn today and your tests are completely normal, you will receive your results only by: [place] (if you have MyChart) OR A paper copy in the mail If you have any lab test that is abnormal or we need to change your treatment, we will call you to review the results.   Testing/Procedures: Not needed   Follow-Up: At [facility], you and your health needs are our priority.  As part of our continuing mission to provide you with exceptional heart care, we have created designated Provider Care Teams.  These Care Teams include your primary Cardiologist (physician) and Advanced Practice Providers (APPs -  Physician Assistants and Nurse Practitioners) who all work together to provide you with the care you need, when you need it.     Your next appointment:   12 month(s)  The format for your next appointment:   In Person  Provider:   Glenetta Hew, MD    Other Instructions

## 2022-02-28 ENCOUNTER — Ambulatory Visit: Payer: Medicare HMO | Admitting: Gastroenterology

## 2022-02-28 ENCOUNTER — Encounter: Payer: Self-pay | Admitting: Gastroenterology

## 2022-02-28 ENCOUNTER — Telehealth: Payer: Self-pay

## 2022-02-28 VITALS — BP 136/72 | HR 60 | Ht 62.0 in | Wt 240.0 lb

## 2022-02-28 DIAGNOSIS — D509 Iron deficiency anemia, unspecified: Secondary | ICD-10-CM | POA: Diagnosis not present

## 2022-02-28 MED ORDER — SUTAB 1479-225-188 MG PO TABS: ORAL_TABLET | ORAL | 0 refills | Status: DC

## 2022-02-28 MED ORDER — NA SULFATE-K SULFATE-MG SULF 17.5-3.13-1.6 GM/177ML PO SOLN: 1.0000 | Freq: Once | ORAL | 0 refills | Status: AC

## 2022-02-28 NOTE — Telephone Encounter (Signed)
   Sandra Beasley 1950/04/04 [id number]  Dear Dr. [facility]:  We have scheduled the above named patient for a(n) EGD/colon procedure. Our records show that (s)he is on anticoagulation therapy.  Please advise as to whether the patient may come off their therapy of Eliquis 2 days prior to their procedure which is scheduled for 03/23/22.  Please route your response to [employer], CMA.  Sincerely,    [facility]

## 2022-02-28 NOTE — Progress Notes (Addendum)
Assessment    Iron deficiency anemia - R/O colorectal neoplasms, AVMs, ulcer, esophagitis, celiac disease, poor iron absorption, History of GERD with LA Grade C esophagitis S/P lap band  Recommendations   Schedule colonoscopy and EGD and plan for celiac biopsies if no other cause is found. The risks (including bleeding, perforation, infection, missed lesions, medication reactions and possible hospitalization or surgery if complications occur), benefits, and alternatives to colonoscopy with possible biopsy and possible polypectomy were discussed with the patient and they consent to proceed.  The risks (including bleeding, perforation, infection, missed lesions, medication reactions and possible hospitalization or surgery if complications occur), benefits, and alternatives to endoscopy with possible biopsy and possible dilation were discussed with the patient and they consent to proceed.   Continue pantoprazole 40 mg daily and follow antireflux measures Hold Eliquis 2 days before procedure - will instruct when and how to resume after procedure. Low but real risk of cardiovascular event such as heart attack, stroke, embolism, thrombosis or ischemia/infarct of other organs off Eliquis explained and need to seek urgent help if this occurs. The patient consents to proceed. Will communicate by phone or EMR with patient's prescribing provider to confirm that holding Eliquis is reasonable in this case.      HPI   Chief complaint: IDA, GERD  Patient profile:  Sandra Beasley is a 72 y.o. female referred by Dede Query, [facility] for iron deficiency anemia.  She has a history of LA grade C esophagitis and a dilated esophagus in the setting of a Lap-Band in 2019.  Her Lap-Band adjusted and her reflux symptoms resolved.  She has remained pantoprazole without reflux symptoms.  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  She is  maintained on Eliquis.  She has vitamin B-12, folate and iron deficiencies followed by hematology.  Her most recent EGD and colonoscopy listed below. Denies weight loss, abdominal pain, constipation, diarrhea, change in stool caliber, melena, hematochezia, nausea, vomiting, dysphagia, reflux symptoms, chest pain.   Previous Labs / Imaging::    Latest Ref Rng & Units 01/19/2022   10:03 AM 12/08/2021   10:10 AM 10/26/2021    8:33 AM  CBC  WBC 4.0 - 10.5 K/uL 5.2  5.2  4.8   Hemoglobin 12.0 - 15.0 g/dL 12.1  11.3  10.5   Hematocrit 36.0 - 46.0 % 36.6  36.0  33.8   Platelets 150 - 400 K/uL 170  178  172     No results found for: "LIPASE"    Latest Ref Rng & Units 01/19/2022   10:03 AM 12/08/2021   10:10 AM 10/26/2021    8:33 AM  CMP  Glucose 70 - 99 mg/dL 257  225  199   BUN 8 - 23 mg/dL 17  20  17   $ Creatinine 0.44 - 1.00 mg/dL 1.35  1.46  1.38   Sodium 135 - 145 mmol/L 141  140  140   Potassium 3.5 - 5.1 mmol/L 3.1  3.7  3.4   Chloride 98 - 111 mmol/L 106  104  106   CO2 22 - 32 mmol/L 29  30  28   $ Calcium 8.9 - 10.3 mg/dL 9.5  9.1  9.4   Total Protein 6.5 - 8.1 g/dL 6.3  7.0  6.4   Total Bilirubin 0.3 - 1.2 mg/dL 0.6  0.6  0.6   Alkaline Phos 38 - 126 U/L 52  51  49   AST 15 - 41 U/L 9  9  10   $ ALT 0 - 44 U/L 11  9  8      $ Previous GI evaluation    Endoscopies:  EGD Nov 2019   Colonoscopy Oct 2012  -Small internal hemorrhoids otherwise normal  Imaging:     Past Medical History:  Diagnosis Date   Adenomatous polyp of colon 09/2005   CAD S/P percutaneous coronary angioplasty - DES x2 in RCA 01/02/2013; 02/2013   a. 100% RPDA - Xience Xpedition DES 2.25 mm x 15 mm + 2.25 mm x 8 mm overlapping, mod- severe distal LAD and prox OM1 lesions.;b.  Myoview 02/2013 & Jan 2020: No ischemia or Infarction, Normal EF.   GERD (gastroesophageal reflux disease) 03/06/2013   Hyperlipidemia associated with type 2 diabetes mellitus ([place])    Hypertension    Obesity    STEMI (ST  elevation myocardial infarction) ([place]) 01/02/13   --> PCI; EF 60 and 65%. Gr 1 DD & No regional WMA, Otherwise relatively normal.   Type 2 diabetes mellitus with circulatory disorder (H[facility])    Vitamin D deficiency    Past Surgical History:  Procedure Laterality Date   ABDOMINAL HYSTERECTOMY     CORONARY ANGIOPLASTY WITH STENT PLACEMENT  01/02/2013    PCI-RCA: Xience Xpedition 2.25 mm x 15 mm, 2.25 mm x 8 mm (2.4 mm)   LAPAROSCOPIC GASTRIC BANDING  2006   LAPAROSCOPIC HYSTERECTOMY  1996   LEFT HEART CATHETERIZATION WITH CORONARY ANGIOGRAM N/A 01/02/2013   Procedure: LEFT HEART CATHETERIZATION WITH CORONARY ANGIOGRAM;  Surgeon: Leonie Man, MD;  Location: [facility] CATH LAB;: Inferior STEMI: 100% PDA; mid-distal LAD beyond D1 (small caliber) 80%.  Ostial D1 40%.  Lateral OM 1 with 70-80% stenosis.  Large branching ramus intermedius with OM and DIAG branch branches.   NM MYOVIEW LTD  01/'15; 1/'20   a) EF 60-65%.  Normal wall motion.  No ischemia or infarction.; b) EF 70 to 75%.  No EKG changes.  No evidence of ischemia or infarction.:   TONSILLECTOMY  1966   TRANSTHORACIC ECHOCARDIOGRAM  01/04/2013   (Post inferior STEMI) EF 60 and 65%. Grade 1 diastolic dysfunction with no regional wall motion abnormalities. Otherwise relatively normal.   Family History  Adopted: Yes  Problem Relation Age of Onset   Breast cancer Maternal Aunt    Diabetes Maternal Aunt    Heart disease Maternal Aunt    Diabetes Maternal Uncle    Diabetes gravidarum Maternal Uncle    Colon cancer Neg Hx    Stomach cancer Neg Hx    Rectal cancer Neg Hx    Social History   Tobacco Use   Smoking status: Never   Smokeless tobacco: Never  Substance Use Topics   Alcohol use: No   Drug use: No   Current Outpatient Medications  Medication Sig Dispense Refill   apixaban (ELIQUIS) 5 MG TABS tablet Please continue with 2 tablets (10 mg) by mouth twice daily. On 11/19/2020 transition to 1 tablet (5 mg) twice daily. 70 tablet  0   candesartan-hydrochlorothiazide (ATACAND HCT) 32-12.5 MG tablet Take 1 tablet by mouth daily.     carvedilol (COREG) 25 MG tablet Take 25 mg by mouth 2 (two) times daily.     Cholecalciferol 1000 UNITS TBDP Take 1,000 Units by mouth every morning.      cyanocobalamin (,VITAMIN B-12,) 1000 MCG/ML injection 1 mL     folic acid (FOLVITE) 1 MG tablet Take 1  tablet (1 mg total) by mouth daily. 30 tablet 6   glimepiride (AMARYL) 4 MG tablet Take 4 mg by mouth daily before breakfast.     metFORMIN (GLUCOPHAGE) 500 MG tablet Take 1,500 mg by mouth 2 (two) times daily with a meal. Taking 2 in am and 1 in pm     metroNIDAZOLE (METROCREAM) 0.75 % cream Apply 1 application topically 2 (two) times daily as needed (rosacea). Apply to face     nitroGLYCERIN (NITROSTAT) 0.4 MG SL tablet Place 1 tablet (0.4 mg total) under the tongue every 5 (five) minutes as needed for chest pain. 25 tablet 1   rosuvastatin (CRESTOR) 10 MG tablet Take 10 mg by mouth daily.  4   triamcinolone cream (KENALOG) 0.1 % Apply 1 application topically 2 (two) times daily as needed (rash/irritation).     pantoprazole (PROTONIX) 40 MG tablet Take 1 tablet (40 mg total) by mouth daily. 30 tablet 1   potassium chloride SA (KLOR-CON M) 20 MEQ tablet Take 1 tablet (20 mEq total) by mouth daily. (Patient not taking: Reported on 02/28/2022) 14 tablet 0   No current facility-administered medications for this visit.   Allergies  Allergen Reactions   Ace Inhibitors Cough   Actos [Pioglitazone] Other (See Comments)    Flu like symptoms   Other Cough    Spices make pt cough, sneeze, eyes water   Statins     Can take Crestor    Review of Systems: All other systems reviewed and negative except where noted in HPI.    Physical Exam    Wt Readings from Last 3 Encounters:  02/28/22 240 lb (108.9 kg)  02/23/22 239 lb 9.6 oz (108.7 kg)  01/19/22 237 lb 12.8 oz (107.9 kg)    Ht 5' 2"$  (1.575 m)   Wt 240 lb (108.9 kg)   BMI 43.90 kg/m   Constitutional:  Generally well appearing female in no acute distress. HEENT: Pupils normal.  Conjunctivae are normal. No scleral icterus. No oral lesions or deformities noted.  Neck: Supple.  Cardiac: Normal rate, regular rhythm without murmurs. Pulmonary/chest: Effort normal and breath sounds normal. No wheezing, rales or rhonchi. Abdominal: Soft, nondistended, nontender. Active bowel sounds. No palpable HSM, masses or hernias. Rectal: Deferred to colonoscopy  Extremities: No edema or deformities noted Neurological: Alert and oriented to person, place and time. Psychiatric: Pleasant. Normal mood and affect. Behavior is normal. Skin: Skin is warm and dry. No rashes noted.  Lucio Edward, MD   cc:  Referring Provider Lawerance Cruel, MD

## 2022-02-28 NOTE — Patient Instructions (Signed)
You have been scheduled for an endoscopy and colonoscopy. Please follow the written instructions given to you at your visit today. Please pick up your prep supplies at the pharmacy within the next 1-3 days. If you use inhalers (even only as needed), please bring them with you on the day of your procedure.  The [facility] providers would like to encourage you to use MYCHART to communicate with providers for non-urgent requests or questions.  Due to long hold times on the telephone, sending your provider a message by MYCHART may be a faster and more efficient way to get a response.  Please allow 48 business hours for a response.  Please remember that this is for non-urgent requests.   Due to recent changes in healthcare laws, you may see the results of your imaging and laboratory studies on MyChart before your provider has had a chance to review them.  We understand that in some cases there may be results that are confusing or concerning to you. Not all laboratory results come back in the same time frame and the provider may be waiting for multiple results in order to interpret others.  Please give us 48 hours in order for your provider to thoroughly review all the results before contacting the office for clarification of your results.   Thank you for choosing me and [facility].  Malcolm T. Stark, Jr., MD., FACG  

## 2022-03-04 DIAGNOSIS — I10 Essential (primary) hypertension: Secondary | ICD-10-CM | POA: Diagnosis not present

## 2022-03-04 DIAGNOSIS — Z6841 Body Mass Index (BMI) 40.0 and over, adult: Secondary | ICD-10-CM | POA: Diagnosis not present

## 2022-03-04 DIAGNOSIS — R229 Localized swelling, mass and lump, unspecified: Secondary | ICD-10-CM | POA: Diagnosis not present

## 2022-03-04 DIAGNOSIS — E1169 Type 2 diabetes mellitus with other specified complication: Secondary | ICD-10-CM | POA: Diagnosis not present

## 2022-03-04 DIAGNOSIS — E876 Hypokalemia: Secondary | ICD-10-CM | POA: Diagnosis not present

## 2022-03-07 ENCOUNTER — Encounter: Payer: Self-pay | Admitting: Cardiology

## 2022-03-07 DIAGNOSIS — Z7901 Long term (current) use of anticoagulants: Secondary | ICD-10-CM | POA: Insufficient documentation

## 2022-03-07 NOTE — Assessment & Plan Note (Addendum)
Chronic iron deficiency anemia that was probably the cause of her fatigue symptoms.  Most recent hemoglobin was pretty stable.  I really think that the anemia is probably related to poor iron uptake with her Lap-Band.  Question if she would potentially benefit from having the Lap-Band removed since it has not helped with her weight loss..  She has pending GI evaluation but with both upper and lower GI scopes.  Should be fine to hold Eliquis for these procedures-hold 2 days prior to maybe 1 day postoperative if biopsies are taken.

## 2022-03-07 NOTE — Telephone Encounter (Signed)
Per Dr. Ellyn Hack note from office visit 02/23/22,She is pending GI evaluation with endoscopy.   Although I have deferred management of anticoagulation to either heme-onc or pulmonary medicine, I can see no reason for her not to hold Eliquis 2 to 3 days preop and 1 day postop from endoscopy.  Routing to Pharmacist for calculated clearance.   Chart reviewed as part of pre-operative protocol coverage. Given past medical history and time since last visit, based on ACC/AHA guidelines, Sandra Beasley would be at acceptable risk for the planned procedure without further cardiovascular testing.     Emmaline Life, NP-C  03/07/2022, 4:17 PM [address], [address] Office [phone] Fax [phone]

## 2022-03-07 NOTE — Assessment & Plan Note (Addendum)
Her weight goes up and down.  She had bariatric surgery with lap band years ago but has not had much benefit.  As noted, I wonder if she would potentially benefit from GLP-1 agonist  We again talked about the importance of dietary modification and exercise.  Unfortunately, she has been very sedentary.  I stressed trying to get back more active, consider doing water aerobics.

## 2022-03-07 NOTE — Assessment & Plan Note (Signed)
Just over 9 years from her inferior STEMI with RCA PCI.  She did have residual disease in the distal LAD and proximal OM1 however she had to follow-up Myoview's in 2015 and 2020 that were nonischemic.  As such, we will hold off on further ischemic evaluation until symptoms warrant.  No longer on Thienopyridine or aspirin-stopped aspirin last visit-because she is on Eliquis long-term.  With concerns for anemia, aspirin was finally stopped.  She remains on Eliquis.  She is doing well on stable dose of carvedilol and Atacand along with low-dose rosuvastatin for hyperlipidemia.  If she would qualify based on having diabetes and CAD, I think she would benefit from GLP-1 agonist -> will defer to PCP.

## 2022-03-07 NOTE — Telephone Encounter (Signed)
[facility] Pre-operative Risk Assessment     Request for surgical clearance:     Endoscopy Procedure  What type of surgery is being performed?     EGD/Colon  When is this surgery scheduled?     03/23/22  What type of clearance is required ?   Pharmacy  Are there any medications that need to be held prior to surgery and how long? Eliquis x 2 days  Practice name and name of physician performing surgery?      [facility]  What is your office phone and fax number?      Phone- [phone]  [phone]  Anesthesia type (None, local, MAC, general) ?       MAC

## 2022-03-07 NOTE — Assessment & Plan Note (Signed)
Her last lipids were checked just over a year ago looked pretty normal..  She should be getting them checked soon.  For now continue current dose of rosuvastatin and monitor closely.  A1c 6.2 on combination of metformin and glipizide.  I truthfully think that she would benefit from a GLP-1 agonist partially because of weight loss benefit as well as improvement in A1c.  Will defer to PCP, but I think Mounjaro or Ozempic would be a good option if covered by insurance.

## 2022-03-07 NOTE — Assessment & Plan Note (Signed)
Has been on long-term anticoagulation with Eliquis since her PEs.  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  I deferred this to pulmonary medicine and heme-onc.  She is pending GI evaluation with endoscopy.   Although I have deferred management of anticoagulation to either heme-onc or pulmonary medicine, I can see no reason for her not to hold Eliquis 2 to 3 days preop and 1 day postop from endoscopy.

## 2022-03-07 NOTE — Assessment & Plan Note (Addendum)
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  However she remains on anticoagulation.  She had initial RV myopathy as result of PE which has resolved by follow-up echocardiogram.  The initial plan was for her to reduce to 2.5 mg twice daily but she remains off 5.  I think will be fine for her to hold Eliquis if necessary for GI procedures.  Holding it for 2 days to be prudent.  If there is any concern of significant bleeding issues, would also recommend holding it for 2 to 3 days.

## 2022-03-07 NOTE — Assessment & Plan Note (Signed)
Now just over 9 years out from MI with RCA PCI.  Has had 2 follow-up Myoview stress test that are negative to evaluate her other notable disease.  EF is stable with no CHF symptoms and no current angina symptoms.  On stable regimen.

## 2022-03-07 NOTE — Assessment & Plan Note (Signed)
BP has been somewhat labile, but looks good today.  She had been on HCTZ along with the Atacand but that was discontinued by PCP.  Follow BP now and see how she does as she is no longer on the HCTZ or amlodipine (had previously been on 5 mg amlodipine).Marland Kitchen

## 2022-03-09 NOTE — Telephone Encounter (Signed)
Patient with diagnosis of bilateral PE Nov 2022 on Eliquis for anticoagulation.    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  However she remains on anticoagulation.  She had initial RV myopathy as result of PE which has resolved by follow-up echocardiogram.  The initial plan was for her to reduce to 2.5 mg twice daily but she remains off 5.   I think will be fine for her to hold Eliquis if necessary for GI procedures.  Holding it for 2 days to be prudent.  If there is any concern of significant bleeding issues, would also recommend holding it for 2 to 3 days.     CrCl 42m/min using adjusted body weight Platelet count 170K  Per office protocol, patient can hold Eliquis for 2 days prior to procedure as requested.     **This guidance is not considered finalized until pre-operative APP has relayed final recommendations.**

## 2022-03-09 NOTE — Telephone Encounter (Signed)
   Primary Cardiologist: Glenetta Hew, MD  Chart reviewed as part of pre-operative protocol coverage. Given past medical history and time since last visit, based on ACC/AHA guidelines, Sandra Beasley would be at acceptable risk for the planned procedure without further cardiovascular testing.   Patient was advised that if she develops new symptoms prior to surgery to contact our office to arrange a follow-up appointment. She verbalized understanding.  Per office protocol, patient can hold Eliquis for 2 days prior to procedure as requested   I will route this recommendation to the requesting party via [facility] fax function and remove from pre-op pool.  Please call with questions.  Emmaline Life, NP-C  03/09/2022, 2:46 PM [address], [address] Office [phone] Fax [phone]

## 2022-03-10 NOTE — Telephone Encounter (Signed)
Left message for patient to return my call.

## 2022-03-11 NOTE — Telephone Encounter (Signed)
Left message for patient to return my call.

## 2022-03-15 NOTE — Telephone Encounter (Signed)
Left message for patient to return my call.

## 2022-03-16 ENCOUNTER — Encounter: Payer: Self-pay | Admitting: Gastroenterology

## 2022-03-16 NOTE — Telephone Encounter (Signed)
Informed patient that she can hold Eliquis 2 days prior to her procedure. Patient verbalized understanding.

## 2022-03-18 DIAGNOSIS — E538 Deficiency of other specified B group vitamins: Secondary | ICD-10-CM | POA: Diagnosis not present

## 2022-03-20 ENCOUNTER — Encounter: Payer: Self-pay | Admitting: Certified Registered Nurse Anesthetist

## 2022-03-23 ENCOUNTER — Encounter: Payer: Self-pay | Admitting: Gastroenterology

## 2022-03-23 ENCOUNTER — Ambulatory Visit (AMBULATORY_SURGERY_CENTER): Payer: Medicare HMO | Admitting: Gastroenterology

## 2022-03-23 VITALS — BP 150/78 | HR 62 | Temp 98.9°F | Resp 15 | Ht 62.0 in | Wt 240.0 lb

## 2022-03-23 DIAGNOSIS — I2510 Atherosclerotic heart disease of native coronary artery without angina pectoris: Secondary | ICD-10-CM | POA: Diagnosis not present

## 2022-03-23 DIAGNOSIS — I252 Old myocardial infarction: Secondary | ICD-10-CM | POA: Diagnosis not present

## 2022-03-23 DIAGNOSIS — D509 Iron deficiency anemia, unspecified: Secondary | ICD-10-CM

## 2022-03-23 DIAGNOSIS — K317 Polyp of stomach and duodenum: Secondary | ICD-10-CM

## 2022-03-23 DIAGNOSIS — K2970 Gastritis, unspecified, without bleeding: Secondary | ICD-10-CM

## 2022-03-23 DIAGNOSIS — K3189 Other diseases of stomach and duodenum: Secondary | ICD-10-CM | POA: Diagnosis not present

## 2022-03-23 DIAGNOSIS — D123 Benign neoplasm of transverse colon: Secondary | ICD-10-CM | POA: Diagnosis not present

## 2022-03-23 DIAGNOSIS — K229 Disease of esophagus, unspecified: Secondary | ICD-10-CM | POA: Diagnosis not present

## 2022-03-23 DIAGNOSIS — K259 Gastric ulcer, unspecified as acute or chronic, without hemorrhage or perforation: Secondary | ICD-10-CM | POA: Diagnosis not present

## 2022-03-23 DIAGNOSIS — K5140 Inflammatory polyps of colon without complications: Secondary | ICD-10-CM | POA: Diagnosis not present

## 2022-03-23 DIAGNOSIS — K635 Polyp of colon: Secondary | ICD-10-CM | POA: Diagnosis not present

## 2022-03-23 MED ORDER — SODIUM CHLORIDE 0.9 % IV SOLN: 500.0000 mL | INTRAVENOUS | Status: DC

## 2022-03-23 NOTE — Op Note (Signed)
[facility] Patient Name: Jenille Kahane Procedure Date: 03/23/2022 10:22 AM MRN: [id number] Endoscopist: Ladene Artist , MD, [id number] Age: 72 Referring MD:  Date of Birth: Jun 23, 1950 Gender: Female Account #: [id number] Procedure:                Upper GI endoscopy Indications:              Unexplained iron deficiency anemia Medicines:                Monitored Anesthesia Care Procedure:                Pre-Anesthesia Assessment:                           - Prior to the procedure, a History and Physical                            was performed, and patient medications and                            allergies were reviewed. The patient's tolerance of                            previous anesthesia was also reviewed. The risks                            and benefits of the procedure and the sedation                            options and risks were discussed with the patient.                            All questions were answered, and informed consent                            was obtained. Prior Anticoagulants: The patient has                            taken Eliquis (apixaban), last dose was 1 day prior                            to procedure. ASA Grade Assessment: III - A patient                            with severe systemic disease. After reviewing the                            risks and benefits, the patient was deemed in                            satisfactory condition to undergo the procedure.                           After obtaining informed consent, the endoscope was  passed under direct vision. Throughout the                            procedure, the patient's blood pressure, pulse, and                            oxygen saturations were monitored continuously. The                            GIF [id number] was introduced through the                            mouth, and advanced to the second part of duodenum.                             The upper GI endoscopy was accomplished without                            difficulty. The patient tolerated the procedure                            well. Scope In: Scope Out: Findings:                 The Z-line was variable and was found at the                            gastroesophageal junction. Biopsies were taken with                            a cold forceps for histology. R/O Barrett's.                           The lumen of the proximal esophagus and mid                            esophagus was moderately dilated.                           Localized moderate inflammation characterized by                            congestion (edema), erythema, friability and                            granularity was found in the gastric fundus at the                            lap band and proximal to the lap band. Biopsies                            were taken with a cold forceps for histology.                           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 This was traversed.                           Multiple small sessile polyps with no bleeding and                            no stigmata of recent bleeding were found in the                            gastric body. Typical appearance for benign fundic                            gland polyps.                           The exam of the stomach was otherwise normal.                           Patchy mildly erythematous mucosa without active                            bleeding and with no stigmata of bleeding was found                            in the duodenal bulb.                           The exam of the duodenum was otherwise normal.                            Biopsies were taken with a cold forcept for                            histology to exclude celiac disease. Complications:            No immediate complications. Estimated Blood Loss:     Estimated blood loss was  minimal. Impression:               - Z-line variable, at the gastroesophageal                            junction. Biopsied.                           - Dilation in the proximal esophagus, mid esophagus.                           - Gastritis associated with the lap band. Biopsied.                           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                           - Multiple gastric polyps.                           - Erythematous duodenopathy.                           - Otherwise duodenum appeared normal. Biopsied. Recommendation:           - Patient has a contact number available for                            emergencies. The signs and symptoms of potential                            delayed complications were discussed with the                            patient. Return to normal activities tomorrow.                            Written discharge instructions were provided to the                            patient.                           - Resume previous diet.                           - Follow antireflux measures.                           - Continue present medications including                            pantoprazole 40 mg qd.                           - Await pathology results.                           - Resume Eliquis (apixaban) at prior dose in 2                            days. Refer to managing physician for further                            adjustment of therapy.                           - Surgical referral to loosen the lap band which                            appears to be causing gastritis and esophageal  dilation.                           - Return to GI office in 2 months. Ladene Artist, MD 03/23/2022 11:09:19 AM This report has been signed electronically.

## 2022-03-23 NOTE — Progress Notes (Signed)
1028 BP 211/119, Labetalol given IV, MD update, vss

## 2022-03-23 NOTE — Progress Notes (Signed)
Report given to PACU, vss 

## 2022-03-23 NOTE — Progress Notes (Signed)
See 02/28/2022 H&P, no changes

## 2022-03-23 NOTE — Progress Notes (Signed)
1022 Robinul 0.1 mg IV given due large amount of secretions upon assessment.  MD made aware, vss

## 2022-03-23 NOTE — Patient Instructions (Addendum)
Please read handouts provided. Continue present medications. Await pathology results. Resume Eliquis ( apixaban ) at prior dose in 2 days. Follow antireflux measures. Continue pantoprazole 40 mg everyday. Return to GI office in 2 months. Surgical referral to loosen lap band. No aspirin, ibuprofen, naproxen, or other non-steriodal anti-inflammatory drugs for 2 weeks.  YOU HAD AN ENDOSCOPIC PROCEDURE TODAY AT [facility]:   Refer to the procedure report that was given to you for any specific questions about what was found during the examination.  If the procedure report does not answer your questions, please call your gastroenterologist to clarify.  If you requested that your care partner not be given the details of your procedure findings, then the procedure report has been included in a sealed envelope for you to review at your convenience later.  YOU SHOULD EXPECT: Some feelings of bloating in the abdomen. Passage of more gas than usual.  Walking can help get rid of the air that was put into your GI tract during the procedure and reduce the bloating. If you had a lower endoscopy (such as a colonoscopy or flexible sigmoidoscopy) you may notice spotting of blood in your stool or on the toilet paper. If you underwent a bowel prep for your procedure, you may not have a normal bowel movement for a few days.  Please Note:  You might notice some irritation and congestion in your nose or some drainage.  This is from the oxygen used during your procedure.  There is no need for concern and it should clear up in a day or so.  SYMPTOMS TO REPORT IMMEDIATELY:  Following lower endoscopy (colonoscopy or flexible sigmoidoscopy):  Excessive amounts of blood in the stool  Significant tenderness or worsening of abdominal pains  Swelling of the abdomen that is new, acute  Fever of 100F or higher  Following upper endoscopy (EGD)  Vomiting of blood or coffee ground material  New chest pain or  pain under the shoulder blades  Painful or persistently difficult swallowing  New shortness of breath  Fever of 100F or higher  Black, tarry-looking stools  For urgent or emergent issues, a gastroenterologist can be reached at any hour by calling [phone]. Do not use MyChart messaging for urgent concerns.    DIET:  We do recommend a small meal at first, but then you may proceed to your regular diet.  Drink plenty of fluids but you should avoid alcoholic beverages for 24 hours.  ACTIVITY:  You should plan to take it easy for the rest of today and you should NOT DRIVE or use heavy machinery until tomorrow (because of the sedation medicines used during the test).    FOLLOW UP: Our staff will call the number listed on your records the next business day following your procedure.  We will call around 7:15- 8:00 am to check on you and address any questions or concerns that you may have regarding the information given to you following your procedure. If we do not reach you, we will leave a message.     If any biopsies were taken you will be contacted by phone or by letter within the next 1-3 weeks.  Please call us at [phone] if you have not heard about the biopsies in 3 weeks.    SIGNATURES/CONFIDENTIALITY: You and/or your care partner have signed paperwork which will be entered into your electronic medical record.  These signatures attest to the fact that that the information above on your After  Visit Summary has been reviewed and is understood.  Full responsibility of the confidentiality of this discharge information lies with you and/or your care-partner. 

## 2022-03-23 NOTE — Op Note (Addendum)
[facility] Patient Name: Sandra Beasley Procedure Date: 03/23/2022 10:23 AM MRN: [id number] Endoscopist: Ladene Artist , MD, [id number] Age: 72 Referring MD:  Date of Birth: 1950-03-16 Gender: Female Account #: [id number] Procedure:                Colonoscopy Indications:              Unexplained iron deficiency anemia Medicines:                Monitored Anesthesia Care Procedure:                Pre-Anesthesia Assessment:                           - Prior to the procedure, a History and Physical                            was performed, and patient medications and                            allergies were reviewed. The patient's tolerance of                            previous anesthesia was also reviewed. The risks                            and benefits of the procedure and the sedation                            options and risks were discussed with the patient.                            All questions were answered, and informed consent                            was obtained. Prior Anticoagulants: The patient has                            taken Eliquis (apixaban), last dose was 1 day prior                            to procedure. ASA Grade Assessment: III - A patient                            with severe systemic disease. After reviewing the                            risks and benefits, the patient was deemed in                            satisfactory condition to undergo the procedure.                           After obtaining informed consent, the colonoscope  was passed under direct vision. Throughout the                            procedure, the patient's blood pressure, pulse, and                            oxygen saturations were monitored continuously. The                            Olympus SN [id number] was introduced through the anus                            and advanced to the the cecum, identified by                             appendiceal orifice and ileocecal valve. The                            ileocecal valve, appendiceal orifice, and rectum                            were photographed. The quality of the bowel                            preparation was good. The colonoscopy was performed                            without difficulty. The patient tolerated the                            procedure well. Scope In: 10:26:00 AM Scope Out: 10:40:09 AM Scope Withdrawal Time: 0 hours 10 minutes 57 seconds  Total Procedure Duration: 0 hours 14 minutes 9 seconds  Findings:                 The perianal and digital rectal examinations were                            normal.                           A 6 mm polyp was found in the mid transverse colon.                            The polyp was sessile. The polyp was removed with a                            cold snare. Resection and retrieval were complete.                           A 12 mm polyp was found in the distal transverse                            colon. The polyp was pedunculated, ulcerated and  oozing. The polyp was removed with a hot snare.                            Resection and retrieval were complete.                           Internal hemorrhoids were found during                            retroflexion. The hemorrhoids were small and Grade                            I (internal hemorrhoids that do not prolapse).                           The exam was otherwise without abnormality on                            direct and retroflexion views. Complications:            No immediate complications. Estimated blood loss:                            None. Estimated Blood Loss:     Estimated blood loss: none. Impression:               - One 6 mm polyp in the mid transverse colon,                            removed with a cold snare. Resected and retrieved.                           - One 12 mm polyp in the distal transverse colon,                             removed with a hot snare. Resected and retrieved.                           - Internal hemorrhoids.                           - The examination was otherwise normal on direct                            and retroflexion views. Recommendation:           - Repeat colonoscopy after studies are complete for                            surveillance based on pathology results.                           - Resume Eliquis (apixaban) in 2 days at prior                            dose.  Refer to managing physician for further                            adjustment of therapy.                           - Patient has a contact number available for                            emergencies. The signs and symptoms of potential                            delayed complications were discussed with the                            patient. Return to normal activities tomorrow.                            Written discharge instructions were provided to the                            patient.                           - Resume previous diet.                           - Continue present medications.                           - Await pathology results.                           - No aspirin, ibuprofen, naproxen, or other                            non-steroidal anti-inflammatory drugs for 2 weeks                            after polyp removal. Ladene Artist, MD 03/23/2022 10:44:25 AM This report has been signed electronically.

## 2022-03-23 NOTE — Progress Notes (Signed)
Called to room to assist during endoscopic procedure.  Patient ID and intended procedure confirmed with present staff. Received instructions for my participation in the procedure from the performing physician.  

## 2022-03-24 ENCOUNTER — Telehealth: Payer: Self-pay

## 2022-03-24 NOTE — Telephone Encounter (Signed)
Left message on follow up call. 

## 2022-03-30 ENCOUNTER — Encounter: Payer: Self-pay | Admitting: Podiatry

## 2022-03-30 DIAGNOSIS — Z1231 Encounter for screening mammogram for malignant neoplasm of breast: Secondary | ICD-10-CM | POA: Diagnosis not present

## 2022-04-04 ENCOUNTER — Ambulatory Visit: Payer: Medicare HMO | Admitting: Podiatry

## 2022-04-05 ENCOUNTER — Encounter: Payer: Self-pay | Admitting: Gastroenterology

## 2022-04-05 ENCOUNTER — Ambulatory Visit: Payer: Medicare HMO | Admitting: Podiatry

## 2022-04-05 ENCOUNTER — Encounter: Payer: Self-pay | Admitting: Podiatry

## 2022-04-05 DIAGNOSIS — L84 Corns and callosities: Secondary | ICD-10-CM

## 2022-04-05 DIAGNOSIS — E1159 Type 2 diabetes mellitus with other circulatory complications: Secondary | ICD-10-CM | POA: Diagnosis not present

## 2022-04-05 NOTE — Progress Notes (Signed)
  Subjective:  Patient ID: Sandra Beasley, female    DOB: [birth year],  MRN: [id number]  Painful thickened elongated nails, calluses returned as well  72 y.o. female returns for follow-up with the above complaint. History confirmed with patient.  Nails are doing much better now that she has let them grow out.  The calluses of thickened again  Objective:  Physical Exam: warm, good capillary refill, no trophic changes or ulcerative lesions, normal DP and PT pulses, normal monofilament exam, normal sensory exam and onychomycosis.  Pinch callus bilateral hallux.  Nails elongated thickened with some subungual debris Assessment:   1. Callus of foot   2. Type 2 diabetes mellitus with other circulatory complications [facility])        Plan:  Patient was evaluated and treated and all questions answered.  The nails are doing much better she will continue her current course of action return to see me as needed before the next visit if she needs matricectomy or ingrown nail removal  All symptomatic hyperkeratoses were safely debrided with a sterile #15 blade to patient's level of comfort without incident. We discussed preventative and palliative care of these lesions including supportive and accommodative shoegear, padding, prefabricated and custom molded accommodative orthoses, use of a pumice stone and lotions/creams daily.    Return in about 3 months (around 07/04/2022) for at risk foot care.

## 2022-04-15 DIAGNOSIS — E538 Deficiency of other specified B group vitamins: Secondary | ICD-10-CM | POA: Diagnosis not present

## 2022-04-22 ENCOUNTER — Other Ambulatory Visit: Payer: Self-pay

## 2022-04-22 ENCOUNTER — Inpatient Hospital Stay: Payer: Medicare HMO | Attending: Physician Assistant

## 2022-04-22 ENCOUNTER — Other Ambulatory Visit: Payer: Self-pay | Admitting: Physician Assistant

## 2022-04-22 DIAGNOSIS — E538 Deficiency of other specified B group vitamins: Secondary | ICD-10-CM | POA: Diagnosis not present

## 2022-04-22 DIAGNOSIS — D508 Other iron deficiency anemias: Secondary | ICD-10-CM

## 2022-04-22 DIAGNOSIS — D509 Iron deficiency anemia, unspecified: Secondary | ICD-10-CM | POA: Diagnosis not present

## 2022-04-22 LAB — CBC WITH DIFFERENTIAL (CANCER CENTER ONLY)
Abs Immature Granulocytes: 0.02 10*3/uL (ref 0.00–0.07)
Basophils Absolute: 0.1 10*3/uL (ref 0.0–0.1)
Basophils Relative: 1 %
Eosinophils Absolute: 0.2 10*3/uL (ref 0.0–0.5)
Eosinophils Relative: 3 %
HCT: 39 % (ref 36.0–46.0)
Hemoglobin: 12.7 g/dL (ref 12.0–15.0)
Immature Granulocytes: 0 %
Lymphocytes Relative: 23 %
Lymphs Abs: 1.4 10*3/uL (ref 0.7–4.0)
MCH: 28.8 pg (ref 26.0–34.0)
MCHC: 32.6 g/dL (ref 30.0–36.0)
MCV: 88.4 fL (ref 80.0–100.0)
Monocytes Absolute: 0.5 10*3/uL (ref 0.1–1.0)
Monocytes Relative: 8 %
Neutro Abs: 4.1 10*3/uL (ref 1.7–7.7)
Neutrophils Relative %: 65 %
Platelet Count: 170 10*3/uL (ref 150–400)
RBC: 4.41 MIL/uL (ref 3.87–5.11)
RDW: 13.5 % (ref 11.5–15.5)
WBC Count: 6.3 10*3/uL (ref 4.0–10.5)
nRBC: 0 % (ref 0.0–0.2)

## 2022-04-22 LAB — IRON AND IRON BINDING CAPACITY (CC-WL,HP ONLY)
Iron: 49 ug/dL (ref 28–170)
Saturation Ratios: 18 % (ref 10.4–31.8)
TIBC: 274 ug/dL (ref 250–450)
UIBC: 225 ug/dL (ref 148–442)

## 2022-04-22 LAB — FERRITIN: Ferritin: 43 ng/mL (ref 11–307)

## 2022-04-25 ENCOUNTER — Telehealth: Payer: Self-pay

## 2022-04-25 NOTE — Telephone Encounter (Signed)
-----   Message from Lincoln Brigham, PA-C sent at 04/25/2022  1:50 PM EDT -----  Please notify patient that there is no evidence of anemia. Iron levels are normal. No need for additional IV iron at this time.  ----- Message ----- From: Buel Ream, [facility] [place] Sent: 04/22/2022   9:11 AM EDT To: Lincoln Brigham, PA-C

## 2022-04-25 NOTE — Telephone Encounter (Signed)
Pt advised with VU 

## 2022-05-04 DIAGNOSIS — N1832 Chronic kidney disease, stage 3b: Secondary | ICD-10-CM | POA: Diagnosis not present

## 2022-05-04 DIAGNOSIS — Z6841 Body Mass Index (BMI) 40.0 and over, adult: Secondary | ICD-10-CM | POA: Diagnosis not present

## 2022-05-04 DIAGNOSIS — K219 Gastro-esophageal reflux disease without esophagitis: Secondary | ICD-10-CM | POA: Diagnosis not present

## 2022-05-04 DIAGNOSIS — E559 Vitamin D deficiency, unspecified: Secondary | ICD-10-CM | POA: Diagnosis not present

## 2022-05-04 DIAGNOSIS — E782 Mixed hyperlipidemia: Secondary | ICD-10-CM | POA: Diagnosis not present

## 2022-05-04 DIAGNOSIS — E538 Deficiency of other specified B group vitamins: Secondary | ICD-10-CM | POA: Diagnosis not present

## 2022-05-04 DIAGNOSIS — Z0000 Encounter for general adult medical examination without abnormal findings: Secondary | ICD-10-CM | POA: Diagnosis not present

## 2022-05-04 DIAGNOSIS — Z9884 Bariatric surgery status: Secondary | ICD-10-CM | POA: Diagnosis not present

## 2022-05-04 DIAGNOSIS — I10 Essential (primary) hypertension: Secondary | ICD-10-CM | POA: Diagnosis not present

## 2022-05-04 DIAGNOSIS — E1169 Type 2 diabetes mellitus with other specified complication: Secondary | ICD-10-CM | POA: Diagnosis not present

## 2022-05-04 DIAGNOSIS — E1122 Type 2 diabetes mellitus with diabetic chronic kidney disease: Secondary | ICD-10-CM | POA: Diagnosis not present

## 2022-05-11 DIAGNOSIS — Z4651 Encounter for fitting and adjustment of gastric lap band: Secondary | ICD-10-CM | POA: Diagnosis not present

## 2022-05-24 ENCOUNTER — Ambulatory Visit: Payer: Medicare HMO | Admitting: Gastroenterology

## 2022-05-30 ENCOUNTER — Ambulatory Visit: Payer: Medicare HMO | Admitting: Gastroenterology

## 2022-05-30 ENCOUNTER — Encounter: Payer: Self-pay | Admitting: Gastroenterology

## 2022-05-30 VITALS — BP 130/78 | HR 68 | Ht 60.0 in | Wt 246.1 lb

## 2022-05-30 DIAGNOSIS — K3189 Other diseases of stomach and duodenum: Secondary | ICD-10-CM | POA: Diagnosis not present

## 2022-05-30 DIAGNOSIS — Z9884 Bariatric surgery status: Secondary | ICD-10-CM

## 2022-05-30 DIAGNOSIS — Z86010 Personal history of colonic polyps: Secondary | ICD-10-CM | POA: Diagnosis not present

## 2022-05-30 DIAGNOSIS — K219 Gastro-esophageal reflux disease without esophagitis: Secondary | ICD-10-CM | POA: Diagnosis not present

## 2022-05-30 NOTE — Progress Notes (Signed)
Assessment     GERD, improved. It was exacerbated by tight lap band Reactive gastropathy IDA improved, likely d/t her bleeding colon polyp S/P lap band, recently loosed Personal history of a 12 mm ulcerated, bleeding sessile serrated colon polyp  BMI=48   Recommendations    Continue pantoprazole 40 mg qd for 2 months total then taper to discontinue and follow antireflux measures. If reflux symptoms return resume pantoprazole 40 mg qd Surveillance colonoscopy in Feb 2027, a 3 year interval Follow up with hematology for IDA REV in 1 year   HPI    This is a 72 year old female returning for follow-up of GERD.  After her Lap-Band was loosened her reflux symptoms have come under very good control however she has started to gain weight.  Her iron deficiency anemia has improved.  Her 12 mm ulcerated, bleeding transverse colon polyp is the suspected source of chronic blood loss.  She has no gastrointestinal complaints today.  EGD Feb 2024 - Z-line variable, at the gastroesophageal junction. Biopsied. - Dilation in the proximal esophagus, mid esophagus. - Gastritis associated with the lap band. Biopsied. 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 - Multiple gastric polyps. - Erythematous duodenopathy. - Otherwise duodenum appeared normal. Biopsied.  Colonoscopy Feb 2024 - One 6 mm polyp in the mid transverse colon, removed with a cold snare. Resected and retrieved - One 12 mm ulcerated, bleeding polyp in the distal transverse colon, removed with a hot snare. Resected and retrieved. - Internal hemorrhoids. - The examination was otherwise normal on direct and retroflexion views.  1. Surgical [P], colon, transverse, polyp (1) - HYPERPLASTIC POLYP WITH SOME FEATURES OF A SESSILE SERRATED LESION. 2. Surgical [P], colon, distal transverse, polyp (1) - INFLAMMATORY/GRANULATION TISSUE POLYP. 3. Surgical [P], duodenal (normal appearance) - DUODENAL MUCOSA WITH  FOCAL PATCH OF OXYNTIC MUCOSA CONSISTENT WITH A SMALL GASTRIC HETEROTOPIA IN THE BACKGROUND OF PROMINENT BRUNNER'S GLANDS, OTHERWISE NO SIGNIFICANT PATHOLOGY. 4. Surgical [P], gastric fundus (gastritis at lap band site) - OXYNTIC MUCOSA WITH REACTIVE/REPARATIVE CHANGE AND FOCAL EROSION. - NO HELICOBACTER PYLORI ORGANISMS IDENTIFIED ON H&E STAINED SLIDE. 5. Surgical [P], esophagus (irregular Z line) - SQUAMOCOLUMNAR JUNCTIONAL MUCOSA WITH REACTIVE/REPARATIVE CHANGE SUGGESTIVE OF REFLUX. - NEGATIVE FOR INTESTINAL METAPLASIA.   Labs / Imaging       Latest Ref Rng & Units 01/19/2022   10:03 AM 12/08/2021   10:10 AM 10/26/2021    8:33 AM  Hepatic Function  Total Protein 6.5 - 8.1 g/dL 6.3  7.0  6.4   Albumin 3.5 - 5.0 g/dL 3.7  3.8  3.8   AST 15 - 41 U/L ALT 0 - 44 U/L Alk Phosphatase 38 - 126 U/L 52  51  49   Total Bilirubin 0.3 - 1.2 mg/dL 0.6  0.6  0.6        Latest Ref Rng & Units 04/22/2022    8:55 AM 01/19/2022   10:03 AM 12/08/2021   10:10 AM  CBC  WBC 4.0 - 10.5 K/uL 6.3  5.2  5.2   Hemoglobin 12.0 - 15.0 g/dL 47.8  29.5  62.1   Hematocrit 36.0 - 46.0 % 39.0  36.6  36.0   Platelets 150 - 400 K/uL 170  170  178    Current Medications, Allergies, Past Medical History, Past Surgical History, Family History and Social History were reviewed in [employer] [facility]  electronic medical record.   Physical Exam: General: Well developed, well nourished, no acute distress Head: Normocephalic and atraumatic Eyes: Sclerae anicteric, EOMI Ears: Normal auditory acuity Mouth: No deformities or lesions noted Lungs: Clear throughout to auscultation Heart: Regular rate and rhythm; No murmurs, rubs or bruits Abdomen: Soft, non tender and non distended. No masses, hepatosplenomegaly or hernias noted. Normal Bowel sounds Rectal: Not done Musculoskeletal: Symmetrical with no gross deformities  Pulses:  Normal pulses noted Extremities: No edema or deformities  noted Neurological: Alert oriented x 4, grossly nonfocal Psychological:  Alert and cooperative. Normal mood and affect   Bernadine Melecio T. Russella Dar, MD 05/30/2022, 3:17 PM

## 2022-05-30 NOTE — Patient Instructions (Addendum)
Stay on pantoprazole 40 mg daily for a total of 8 week, then reduce to every other day for 2 weeks, then reduce to every 3rd day for 2 weeks, then stop.   You can resume pantoprazole if needed with recurrent symptoms.  The [facility] providers would like to encourage you to use [facility] to communicate with providers for non-urgent requests or questions.  Due to long hold times on the telephone, sending your provider a message by [facility] may be a faster and more efficient way to get a response.  Please allow 48 business hours for a response.  Please remember that this is for non-urgent requests.   Thank you for choosing me and [facility].  Venita Lick. Pleas Koch., MD., Clement[facility] Graham

## 2022-06-01 DIAGNOSIS — H2513 Age-related nuclear cataract, bilateral: Secondary | ICD-10-CM | POA: Diagnosis not present

## 2022-06-01 DIAGNOSIS — Z7984 Long term (current) use of oral hypoglycemic drugs: Secondary | ICD-10-CM | POA: Diagnosis not present

## 2022-06-01 DIAGNOSIS — E119 Type 2 diabetes mellitus without complications: Secondary | ICD-10-CM | POA: Diagnosis not present

## 2022-07-05 ENCOUNTER — Ambulatory Visit: Payer: Medicare HMO | Admitting: Podiatry

## 2022-07-05 DIAGNOSIS — L84 Corns and callosities: Secondary | ICD-10-CM | POA: Diagnosis not present

## 2022-07-05 DIAGNOSIS — E119 Type 2 diabetes mellitus without complications: Secondary | ICD-10-CM

## 2022-07-05 DIAGNOSIS — E1159 Type 2 diabetes mellitus with other circulatory complications: Secondary | ICD-10-CM | POA: Diagnosis not present

## 2022-07-05 NOTE — Progress Notes (Signed)
  Subjective:  Patient ID: Sandra Beasley, female    DOB: November 07, 1950,  MRN: [id number]  Painful thickened elongated calluses   72 y.o. female returns for follow-up with the above complaint. History confirmed with patient.  Nail still doing okay not bothersome.  Objective:  Physical Exam: warm, good capillary refill, no trophic changes or ulcerative lesions, normal DP and PT pulses, normal monofilament exam, normal sensory exam and onychomycosis.  Pinch callus bilateral hallux.  Nails nontender Assessment:   1. Callus of foot   2. Type 2 diabetes mellitus without complication, without long-term current use of insulin (HCC)   3. Encounter for diabetic foot exam [facility])        Plan:  Patient was evaluated and treated and all questions answered.  Patient educated on diabetes. Discussed proper diabetic foot care and discussed risks and complications of disease. Educated patient in depth on reasons to return to the office immediately should he/she discover anything concerning or new on the feet. All questions answered. Discussed proper shoes as well.  Annual diabetic foot risk examination performed.  Overall low risk of complications due to good perfusion and no neuropathy   All symptomatic hyperkeratoses were safely debrided with a sterile #15 blade to patient's level of comfort without incident. We discussed preventative and palliative care of these lesions including supportive and accommodative shoegear, padding, prefabricated and custom molded accommodative orthoses, use of a pumice stone and lotions/creams daily.    Return in about 3 months (around 10/05/2022) for at risk diabetic foot care.

## 2022-07-20 ENCOUNTER — Other Ambulatory Visit: Payer: Self-pay | Admitting: Physician Assistant

## 2022-07-20 DIAGNOSIS — D508 Other iron deficiency anemias: Secondary | ICD-10-CM

## 2022-07-22 ENCOUNTER — Other Ambulatory Visit: Payer: Self-pay

## 2022-07-22 ENCOUNTER — Inpatient Hospital Stay: Payer: Medicare HMO | Attending: Physician Assistant

## 2022-07-22 ENCOUNTER — Telehealth: Payer: Self-pay

## 2022-07-22 ENCOUNTER — Inpatient Hospital Stay: Payer: Medicare HMO | Admitting: Physician Assistant

## 2022-07-22 VITALS — BP 170/78 | HR 73 | Temp 97.9°F | Resp 18 | Ht 60.0 in | Wt 253.9 lb

## 2022-07-22 DIAGNOSIS — Z86718 Personal history of other venous thrombosis and embolism: Secondary | ICD-10-CM | POA: Diagnosis not present

## 2022-07-22 DIAGNOSIS — Z90710 Acquired absence of both cervix and uterus: Secondary | ICD-10-CM | POA: Insufficient documentation

## 2022-07-22 DIAGNOSIS — Z86711 Personal history of pulmonary embolism: Secondary | ICD-10-CM | POA: Insufficient documentation

## 2022-07-22 DIAGNOSIS — D509 Iron deficiency anemia, unspecified: Secondary | ICD-10-CM | POA: Insufficient documentation

## 2022-07-22 DIAGNOSIS — E538 Deficiency of other specified B group vitamins: Secondary | ICD-10-CM | POA: Insufficient documentation

## 2022-07-22 DIAGNOSIS — Z7901 Long term (current) use of anticoagulants: Secondary | ICD-10-CM | POA: Diagnosis not present

## 2022-07-22 DIAGNOSIS — Z803 Family history of malignant neoplasm of breast: Secondary | ICD-10-CM | POA: Diagnosis not present

## 2022-07-22 DIAGNOSIS — D508 Other iron deficiency anemias: Secondary | ICD-10-CM | POA: Diagnosis not present

## 2022-07-22 DIAGNOSIS — E119 Type 2 diabetes mellitus without complications: Secondary | ICD-10-CM | POA: Diagnosis not present

## 2022-07-22 LAB — IRON AND IRON BINDING CAPACITY (CC-WL,HP ONLY)
Iron: 45 ug/dL (ref 28–170)
Saturation Ratios: 15 % (ref 10.4–31.8)
TIBC: 304 ug/dL (ref 250–450)
UIBC: 259 ug/dL (ref 148–442)

## 2022-07-22 LAB — CBC WITH DIFFERENTIAL (CANCER CENTER ONLY)
Abs Immature Granulocytes: 0.02 10*3/uL (ref 0.00–0.07)
Basophils Absolute: 0.1 10*3/uL (ref 0.0–0.1)
Basophils Relative: 1 %
Eosinophils Absolute: 0.2 10*3/uL (ref 0.0–0.5)
Eosinophils Relative: 3 %
HCT: 39.2 % (ref 36.0–46.0)
Hemoglobin: 12.6 g/dL (ref 12.0–15.0)
Immature Granulocytes: 0 %
Lymphocytes Relative: 21 %
Lymphs Abs: 1.2 10*3/uL (ref 0.7–4.0)
MCH: 28.6 pg (ref 26.0–34.0)
MCHC: 32.1 g/dL (ref 30.0–36.0)
MCV: 89.1 fL (ref 80.0–100.0)
Monocytes Absolute: 0.4 10*3/uL (ref 0.1–1.0)
Monocytes Relative: 6 %
Neutro Abs: 3.9 10*3/uL (ref 1.7–7.7)
Neutrophils Relative %: 69 %
Platelet Count: 191 10*3/uL (ref 150–400)
RBC: 4.4 MIL/uL (ref 3.87–5.11)
RDW: 13.5 % (ref 11.5–15.5)
WBC Count: 5.7 10*3/uL (ref 4.0–10.5)
nRBC: 0 % (ref 0.0–0.2)

## 2022-07-22 LAB — FERRITIN: Ferritin: 39 ng/mL (ref 11–307)

## 2022-07-22 LAB — VITAMIN B12: Vitamin B-12: 382 pg/mL (ref 180–914)

## 2022-07-22 NOTE — Telephone Encounter (Signed)
-----   Message from Briant Cedar, PA-C sent at 07/22/2022  2:55 PM EDT ----- Please notify patient that iron and B12 levels are normal range. No need for IV iron or B12 injections at this time.

## 2022-07-22 NOTE — Progress Notes (Signed)
[facility] [phone]   [phone]  PROGRESS NOTE  Patient Care Team: Daisy Floro, MD as PCP - General (Family Medicine) Marykay Lex, MD as PCP - Cardiology (Cardiology)  Hematological/Oncological History -11/09/2020-11/13/2020: Admitted due to progressive dyspnea.  CT angiogram of the chest showed extensive bilateral pulmonary emboli with associated right heart strain.  Doppler ultrasound revealed acute DVT involving the left posterior tibial veins, left peroneal veins and right posterior tibial veins.  Patient was started on IV heparin drip and transition to Eliquis.    -05/17/2021: Establish care with Georga Kaufmann PA-C  -09/22/2021-10/20/2021: Received IV venofer 200 mg once a week x 5 doses  -12/15/2021: Received IV venofer 200 mg x 1 dose  -01/28/2022-02/09/2022: Received IV venofer 200 mg x 2 doses.   CHIEF COMPLAINTS/PURPOSE OF CONSULTATION:  -H/O bilateral DVT and pulmonary emboli -Iron deficiency anemia/folate deficiency/vitamin B12 deficiency  HISTORY OF PRESENTING ILLNESS:  Sandra Beasley 72 y.o. female returns for a follow up for iron, B12 and folate deficiency. She is unaccompanied for this visit.  He was last seen on 01/19/2022.  In the interim she has received IV venofer x 12 doses.   On exam today,Sandra Beasley reports her energy levels are overall stable and she doesn't have any fatigue. She has noticed approximately 20 lb weight gain after her lap band was loosened. She does add that her acid reflux symptoms have results and she is no longer taking a PPI. She is trying to stay on [facility] for weight loss but admits her copay is very expensive. She denies nausea, vomiting or abdominal pain. Her bowel habits are unchanged. She denies easy bruising or signs of active bleeding. She has some shortness of breath with exertion but contributes this to her weight gain. She denies fevers, chills, sweats, chest pain or cough. She has no  other complaints. Rest of the 10 point ROS is below  MEDICAL HISTORY:  Past Medical History:  Diagnosis Date   Adenomatous polyp of colon 09/2005   Bilateral pulmonary embolism (HCC) 11/09/2020   PE protocol CTA 11/09/2020: Extensive bilateral pulmonary artery thrombus within numerous bilateral upper lobe, right middle lobe and bilateral lower lobe branches. No saddle PE. Mild cardiomegaly with right-sided heart strain.  McKeown signs seen on TTE with severely reduced RV function, IVS flattening in S&D (RV P & Vol Overload) - PAP ~ 57 mmHg. IAS Bowel to L - high RAP. + McConnell Sign   CAD S/P percutaneous coronary angioplasty - DES x2 in RCA 01/02/2013; 02/2013   a. 100% RPDA - Xience Xpedition DES 2.25 mm x 15 mm + 2.25 mm x 8 mm overlapping, mod- severe distal LAD and prox OM1 lesions.;b.  Myoview 02/2013 & Jan 2020: No ischemia or Infarction, Normal EF.   DVT of lower extremity, bilateral (HCC) 11/2020   Presented with bilateral PEs.  Noted to have bilateral popliteal vein DVTs-follow on a trip to and from [place].   GERD (gastroesophageal reflux disease) 03/06/2013   Hiatal hernia without gangrene and obstruction 11/2020   CTA of the chest showed: Moderate size hiatal hernia with evidence of prior Lap-Band. Also noted cholelithiasis   Hyperlipidemia associated with type 2 diabetes mellitus (HCC)    Hypertension    Obesity    STEMI (ST elevation myocardial infarction) (HCC) 01/02/2013   --> PCI; EF 60 and 65%. Gr 1 DD & No regional WMA, Otherwise relatively normal.   Type 2 diabetes mellitus with circulatory disorder (HCC)  Vitamin D deficiency     SURGICAL HISTORY: Past Surgical History:  Procedure Laterality Date   ABDOMINAL HYSTERECTOMY     CORONARY ANGIOPLASTY WITH STENT PLACEMENT  01/02/2013   PCI-RCA: Xience Xpedition 2.25 mm x 15 mm, 2.25 mm x 8 mm (2.4 mm)   LAPAROSCOPIC GASTRIC BANDING  2006   LAPAROSCOPIC HYSTERECTOMY  1996   LEFT HEART CATHETERIZATION WITH CORONARY  ANGIOGRAM N/A 01/02/2013   Procedure: LEFT HEART CATHETERIZATION WITH CORONARY ANGIOGRAM;  Surgeon: Marykay Lex, MD;  Location: [facility] CATH LAB;: Inferior STEMI: 100% PDA; mid-distal LAD beyond D1 (small caliber) 80%.  Ostial D1 40%.  Lateral OM 1 with 70-80% stenosis.  Large branching ramus intermedius with OM and DIAG branch branches.   NM MYOVIEW LTD  01/'15; 1/'20   a) EF 60-65%.  Normal wall motion.  No ischemia or infarction.; b) EF 70 to 75%.  No EKG changes.  No evidence of ischemia or infarction.:   TONSILLECTOMY  1966   TRANSTHORACIC ECHOCARDIOGRAM  01/04/2013   (Post inferior STEMI) EF 60 and 65%. Grade 1 diastolic dysfunction with no regional wall motion abnormalities. Otherwise relatively normal.   TRANSTHORACIC ECHOCARDIOGRAM  11/10/2020   (In setting of PE) EF 55 to 60%.  No RWMA.  GR 1 DD.  IVS flattening in systole and diastole consistent with RV pressure volume overload.  Severely reduced RV function with mildly elevated PAP roughly 57 mmHg.  Normal atrial sizes, I-S bowing to the left suggesting high RAP.  McConnell sign also noted suggesting a pulmonary embolus.   TRANSTHORACIC ECHOCARDIOGRAM  02/23/2021   3 months post PE: EF 65 to 70%.  No RWMA.  GR 1 DD with moderate LA dilation.  Unable to assess PAP.  Normal aortic and mitral valves.  Ascending Aorta measured at 37 mmHg. Comparison(s): 11/10/20 EF 55-60%. PA pressure    SOCIAL HISTORY: Social History   Socioeconomic History   Marital status: Divorced    Spouse name: Not on file   Number of children: 3   Years of education: Not on file   Highest education level: Not on file  Occupational History   Occupation: Teacher  Tobacco Use   Smoking status: Never   Smokeless tobacco: Never  Substance and Sexual Activity   Alcohol use: No   Drug use: No   Sexual activity: Not on file  Other Topics Concern   Not on file  Social History Narrative   She is a divorced nonsmoker who works for [employer]. She is  currently now in cardiac rehabilitation. Has changed her dietary habits to a Vegan lifestyle.   Social Determinants of Health   Financial Resource Strain: Not on file  Food Insecurity: Not on file  Transportation Needs: Not on file  Physical Activity: Not on file  Stress: Not on file  Social Connections: Not on file  Intimate Partner Violence: Not on file    FAMILY HISTORY: Family History  Adopted: Yes  Problem Relation Age of Onset   Breast cancer Maternal Aunt    Diabetes Maternal Aunt    Heart disease Maternal Aunt    Diabetes Maternal Uncle    Diabetes gravidarum Maternal Uncle    Colon cancer Neg Hx    Stomach cancer Neg Hx    Rectal cancer Neg Hx     ALLERGIES:  is allergic to ace inhibitors, actos [pioglitazone], other, and statins.  MEDICATIONS:  Current Outpatient Medications  Medication Sig Dispense Refill   amLODipine (NORVASC) 5 MG tablet  Take 5 mg by mouth daily.     candesartan (ATACAND) 32 MG tablet 1 tablet Orally Once a day for 90 days     carvedilol (COREG) 25 MG tablet Take 25 mg by mouth 2 (two) times daily.     Cholecalciferol (VITAMIN D3) 25 MCG (1000 UT) capsule 1 capsule Orally Once a day     Cholecalciferol 1000 UNITS TBDP Take 1,000 Units by mouth every morning.      ELIQUIS 2.5 MG TABS tablet Take 2.5 mg by mouth 2 (two) times daily.     glipiZIDE (GLUCOTROL XL) 5 MG 24 hr tablet 1 tablet with food Orally Once a day     metFORMIN (GLUCOPHAGE) 500 MG tablet Take 2 by mouth in am and 1 in pm     metroNIDAZOLE (METROCREAM) 0.75 % cream Apply 1 application topically 2 (two) times daily as needed (rosacea). Apply to face     MOUNJARO 2.5 MG/0.5ML Pen Inject 2.5 mg into the skin.     nitroGLYCERIN (NITROSTAT) 0.4 MG SL tablet Place 1 tablet (0.4 mg total) under the tongue every 5 (five) minutes as needed for chest pain. 25 tablet 1   rosuvastatin (CRESTOR) 10 MG tablet Take 10 mg by mouth daily.  4   triamcinolone cream (KENALOG) 0.1 % Apply 1  application topically 2 (two) times daily as needed (rash/irritation).     cyanocobalamin (,VITAMIN B-12,) 1000 MCG/ML injection 1 mL (Patient not taking: Reported on 07/22/2022)     folic acid (FOLVITE) 1 MG tablet Take 1 tablet (1 mg total) by mouth daily. (Patient not taking: Reported on 07/22/2022) 30 tablet 6   glimepiride (AMARYL) 4 MG tablet Take 4 mg by mouth daily before breakfast. (Patient not taking: Reported on 07/22/2022)     pantoprazole (PROTONIX) 40 MG tablet Take 1 tablet (40 mg total) by mouth daily. 30 tablet 1   No current facility-administered medications for this visit.    REVIEW OF SYSTEMS:   Constitutional: ( - ) fevers, ( - )  chills , ( - ) night sweats Eyes: ( - ) blurriness of vision, ( - ) double vision, ( - ) watery eyes Ears, nose, mouth, throat, and face: ( - ) mucositis, ( - ) sore throat Respiratory: ( - ) cough, ( + ) dyspnea, ( - ) wheezes Cardiovascular: ( - ) palpitation, ( - ) chest discomfort, (+ ) lower extremity swelling Gastrointestinal:  ( - ) nausea, ( - ) heartburn, ( - ) change in bowel habits Skin: ( - ) abnormal skin rashes Lymphatics: ( - ) new lymphadenopathy, ( - ) easy bruising Neurological: ( - ) numbness, ( - ) tingling, ( - ) new weaknesses Behavioral/Psych: ( - ) mood change, ( - ) new changes  All other systems were reviewed with the patient and are negative.  PHYSICAL EXAMINATION: ECOG PERFORMANCE STATUS: 1 - Symptomatic but completely ambulatory  Vitals:   07/22/22 1013  BP: (!) 170/78  Pulse: 73  Resp: 18  Temp: 97.9 F (36.6 C)  SpO2: 96%   Filed Weights   07/22/22 1013  Weight: 253 lb 14.4 oz (115.2 kg)    GENERAL: well appearing female in NAD SKIN: skin color, texture, turgor are normal, no rashes or significant lesions EYES: conjunctiva are pink and non-injected, sclera clear OROPHARYNX: no exudate, no erythema; lips, buccal mucosa, and tongue normal  LUNGS: clear to auscultation and percussion with normal  breathing effort HEART: regular rate & rhythm and no  murmurs. Trace bilateral lower extremity edema Musculoskeletal: no cyanosis of digits and no clubbing  PSYCH: alert & oriented x 3, fluent speech NEURO: no focal motor/sensory deficits  LABORATORY DATA:  I have reviewed the data as listed    Latest Ref Rng & Units 07/22/2022   10:00 AM 04/22/2022    8:55 AM 01/19/2022   10:03 AM  CBC  WBC 4.0 - 10.5 K/uL 5.7  6.3  5.2   Hemoglobin 12.0 - 15.0 g/dL 16.1  09.6  04.5   Hematocrit 36.0 - 46.0 % 39.2  39.0  36.6   Platelets 150 - 400 K/uL 191  170  170        Latest Ref Rng & Units 01/19/2022   10:03 AM 12/08/2021   10:10 AM 10/26/2021    8:33 AM  CMP  Glucose 70 - 99 mg/dL 409  811  914   BUN 8 - 23 mg/dL 17  20  17    Creatinine 0.44 - 1.00 mg/dL 7.82  9.56  2.13   Sodium 135 - 145 mmol/L 141  140  140   Potassium 3.5 - 5.1 mmol/L 3.1  3.7  3.4   Chloride 98 - 111 mmol/L 106  104  106   CO2 22 - 32 mmol/L 29  30  28    Calcium 8.9 - 10.3 mg/dL 9.5  9.1  9.4   Total Protein 6.5 - 8.1 g/dL 6.3  7.0  6.4   Total Bilirubin 0.3 - 1.2 mg/dL 0.6  0.6  0.6   Alkaline Phos 38 - 126 U/L 52  51  49   AST 15 - 41 U/L 9  9  10    ALT 0 - 44 U/L 11  9  8      ASSESSMENT & PLAN Sandra Beasley is a 72 y.o. female returns for a follow up for history of DVT/PE and nutritional deficiencies.    #H/O bilateral lower extremity DVT and bilateral pulmonary emboli with right heart strain: --Diagnosed in October 2022 with CTA that showed extensive bilateral pulmonary emboli with associated right heart strain. Doppler US showed acute DVT involving the left posterior tibial veins, left peroneal veins and right posterior tibial veins. --Since there is no definitive provoking factor and due to the extensive thrombotic presentation, the recommendation is indefinite anticoagulation.  --Labs from 05/17/2021 ruled our antiphospholipid syndrome.  --Currently on Eliquis maintenance dose of 2.5 mg twice daily.    #Vitamin B12/folate/iron deficiencies: --Likely secondary to gastric banding but has history of erosive gastropathy and duodenal erosions, likely secondary to her lap band in 2019.  --Underwent EGD/colonoscopy in February 2024. Findings that could have caused IDA include gastritis associated with the lap band, ulcerative polyp in the distal transverse colon, internal hemorrhoids.  --Last received IV venofer 200 mg x 2 doses on 01/28/2022-02/09/2022.  --Labs today shows no evidence of anemia with Hgb 12.6, MCV 89.1. Iron panel shows that iron 45, TIBC 304, saturation 15%, ferritin 39. B12 level is 382.  --No need for additional IV iron at this time. Okay to hold B12 injections at this time.  --RTC in 3 months with labs and 6 months with labs/follow up.      No orders of the defined types were placed in this encounter.   All questions were answered. The patient knows to call the clinic with any problems, questions or concerns.  I have spent a total of 25 minutes minutes of face-to-face and non-face-to-face time, preparing to see the patient,  performing a medically appropriate examination, counseling and educating the patient, ordering tests, documenting clinical information in the electronic health record, and care coordination.   Georga Kaufmann, PA-C Department of Hematology/Oncology [facility] Phone: [phone]

## 2022-07-22 NOTE — Telephone Encounter (Signed)
LM for pt with lab results.   ?

## 2022-09-01 DIAGNOSIS — E119 Type 2 diabetes mellitus without complications: Secondary | ICD-10-CM | POA: Diagnosis not present

## 2022-09-01 DIAGNOSIS — Z6841 Body Mass Index (BMI) 40.0 and over, adult: Secondary | ICD-10-CM | POA: Diagnosis not present

## 2022-09-20 DIAGNOSIS — E1122 Type 2 diabetes mellitus with diabetic chronic kidney disease: Secondary | ICD-10-CM | POA: Diagnosis not present

## 2022-09-20 DIAGNOSIS — R0609 Other forms of dyspnea: Secondary | ICD-10-CM | POA: Diagnosis not present

## 2022-09-20 DIAGNOSIS — D649 Anemia, unspecified: Secondary | ICD-10-CM | POA: Diagnosis not present

## 2022-09-20 DIAGNOSIS — N1832 Chronic kidney disease, stage 3b: Secondary | ICD-10-CM | POA: Diagnosis not present

## 2022-10-12 ENCOUNTER — Inpatient Hospital Stay: Payer: Medicare HMO | Attending: Physician Assistant

## 2022-10-12 DIAGNOSIS — Z86711 Personal history of pulmonary embolism: Secondary | ICD-10-CM | POA: Insufficient documentation

## 2022-10-12 DIAGNOSIS — Z7901 Long term (current) use of anticoagulants: Secondary | ICD-10-CM | POA: Insufficient documentation

## 2022-10-12 DIAGNOSIS — E538 Deficiency of other specified B group vitamins: Secondary | ICD-10-CM | POA: Insufficient documentation

## 2022-10-12 DIAGNOSIS — Z86718 Personal history of other venous thrombosis and embolism: Secondary | ICD-10-CM | POA: Diagnosis not present

## 2022-10-12 DIAGNOSIS — D509 Iron deficiency anemia, unspecified: Secondary | ICD-10-CM | POA: Diagnosis not present

## 2022-10-12 DIAGNOSIS — D508 Other iron deficiency anemias: Secondary | ICD-10-CM

## 2022-10-12 LAB — IRON AND IRON BINDING CAPACITY (CC-WL,HP ONLY)
Iron: 69 ug/dL (ref 28–170)
Saturation Ratios: 23 % (ref 10.4–31.8)
TIBC: 298 ug/dL (ref 250–450)
UIBC: 229 ug/dL (ref 148–442)

## 2022-10-12 LAB — CBC WITH DIFFERENTIAL (CANCER CENTER ONLY)
Abs Immature Granulocytes: 0.02 10*3/uL (ref 0.00–0.07)
Basophils Absolute: 0 10*3/uL (ref 0.0–0.1)
Basophils Relative: 1 %
Eosinophils Absolute: 0.2 10*3/uL (ref 0.0–0.5)
Eosinophils Relative: 3 %
HCT: 38.6 % (ref 36.0–46.0)
Hemoglobin: 12.6 g/dL (ref 12.0–15.0)
Immature Granulocytes: 0 %
Lymphocytes Relative: 21 %
Lymphs Abs: 1.2 10*3/uL (ref 0.7–4.0)
MCH: 28.4 pg (ref 26.0–34.0)
MCHC: 32.6 g/dL (ref 30.0–36.0)
MCV: 86.9 fL (ref 80.0–100.0)
Monocytes Absolute: 0.4 10*3/uL (ref 0.1–1.0)
Monocytes Relative: 7 %
Neutro Abs: 3.9 10*3/uL (ref 1.7–7.7)
Neutrophils Relative %: 68 %
Platelet Count: 172 10*3/uL (ref 150–400)
RBC: 4.44 MIL/uL (ref 3.87–5.11)
RDW: 13.9 % (ref 11.5–15.5)
WBC Count: 5.8 10*3/uL (ref 4.0–10.5)
nRBC: 0 % (ref 0.0–0.2)

## 2022-10-12 LAB — FERRITIN: Ferritin: 35 ng/mL (ref 11–307)

## 2022-10-12 LAB — VITAMIN B12: Vitamin B-12: 401 pg/mL (ref 180–914)

## 2022-10-14 ENCOUNTER — Telehealth: Payer: Self-pay

## 2022-10-14 NOTE — Telephone Encounter (Signed)
LM for pt with lab results.   ?

## 2022-10-14 NOTE — Telephone Encounter (Signed)
-----   Message from Briant Cedar sent at 10/14/2022  9:28 AM EDT ----- Please notify patient that she has no anemia. Also her iron and B12 are in range. No need for IV iron and B12 injections at this time. ----- Message ----- From: Leory Plowman, Lab In [place] Sent: 10/12/2022  10:10 AM EDT To: Briant Cedar, PA-C

## 2022-10-17 ENCOUNTER — Telehealth: Payer: Self-pay | Admitting: Gastroenterology

## 2022-10-17 NOTE — Telephone Encounter (Signed)
Inbound call from patient stating when she lays down she feels as though she has heartburn but she believes it is related to her esophagus. Patient is scheduled for 11/21. Patient requesting a call back to discuss further and to be advised of options to help in the meantime. Please advise, thank you.

## 2022-10-17 NOTE — Telephone Encounter (Signed)
Patient returning call. Please advise

## 2022-10-17 NOTE — Telephone Encounter (Signed)
See recommendations per last office visit.    Left message on machine to call back    "Continue pantoprazole 40 mg qd for 2 months total then taper to discontinue and follow antireflux measures. If reflux symptoms return resume pantoprazole 40 mg qd Surveillance colonoscopy in Feb 2027, a 3 year interval Follow up with hematology for IDA REV in 1 year"

## 2022-10-18 NOTE — Telephone Encounter (Signed)
The pt states that she has had a return of symptoms of reflux and has an appt in November to discuss.  She was told at her last office visit that she should taper the pantoprazole and resume if her symptoms return.  She has not resumed at this time. I have advised that per Dr Anselm Jungling last note she should resume and keep the appt with him that has been set up.  The pt has been advised of the information and verbalized understanding.

## 2022-10-28 DIAGNOSIS — I10 Essential (primary) hypertension: Secondary | ICD-10-CM | POA: Diagnosis not present

## 2022-10-28 DIAGNOSIS — Z09 Encounter for follow-up examination after completed treatment for conditions other than malignant neoplasm: Secondary | ICD-10-CM | POA: Diagnosis not present

## 2022-10-28 DIAGNOSIS — E1122 Type 2 diabetes mellitus with diabetic chronic kidney disease: Secondary | ICD-10-CM | POA: Diagnosis not present

## 2022-10-28 DIAGNOSIS — E538 Deficiency of other specified B group vitamins: Secondary | ICD-10-CM | POA: Diagnosis not present

## 2022-11-14 ENCOUNTER — Encounter: Payer: Self-pay | Admitting: Podiatry

## 2022-11-14 ENCOUNTER — Ambulatory Visit (INDEPENDENT_AMBULATORY_CARE_PROVIDER_SITE_OTHER): Payer: Medicare HMO | Admitting: Podiatry

## 2022-11-14 DIAGNOSIS — E119 Type 2 diabetes mellitus without complications: Secondary | ICD-10-CM | POA: Diagnosis not present

## 2022-11-14 DIAGNOSIS — L84 Corns and callosities: Secondary | ICD-10-CM

## 2022-11-14 DIAGNOSIS — M79675 Pain in left toe(s): Secondary | ICD-10-CM | POA: Diagnosis not present

## 2022-11-14 DIAGNOSIS — M79674 Pain in right toe(s): Secondary | ICD-10-CM

## 2022-11-14 DIAGNOSIS — B351 Tinea unguium: Secondary | ICD-10-CM

## 2022-11-14 NOTE — Progress Notes (Signed)
Subjective:  Patient ID: Sandra Beasley, female    DOB: [birth year],  MRN: [id number]  Chief Complaint  Patient presents with   Nail Problem    Callus on both big toes, tendencies for ingrown nail.  Keeping them long seems to help.  Recently had a pedicure and suggested she have them cut by physician.      72 y.o. female returns for follow-up with the above complaint. History confirmed with patient.  She has had a change in her diabetic meds recently started on repaglinide as well  Objective:  Physical Exam: warm, good capillary refill, no trophic changes or ulcerative lesions, normal DP and PT pulses, normal monofilament exam, normal sensory exam and onychomycosis.  Pinch callus bilateral hallux.  Thickening and ingrowing nails with subungual debris bilateral first and left second nail Assessment:   1. Pain due to onychomycosis of toenails of both feet   2. Callus of foot   3. Type 2 diabetes mellitus without complication, without long-term current use of insulin (HCC)         Plan:  Patient was evaluated and treated and all questions answered.  Discussed the etiology and treatment options for the condition in detail with the patient. . Recommended debridement of the nails today. Sharp and mechanical debridement performed of all painful and mycotic nails today. Nails debrided in length and thickness using a nail nipper to level of comfort. Discussed treatment options including appropriate shoe gear. Follow up as needed for painful nails.    All symptomatic hyperkeratoses were safely debrided with a sterile #15 blade to patient's level of comfort without incident. We discussed preventative and palliative care of these lesions including supportive and accommodative shoegear, padding, prefabricated and custom molded accommodative orthoses, use of a pumice stone and lotions/creams daily.    Return in about 3 months (around 02/14/2023) for at risk diabetic foot care.

## 2022-12-14 ENCOUNTER — Telehealth: Payer: Self-pay | Admitting: Cardiology

## 2022-12-14 NOTE — Telephone Encounter (Signed)
Pt c/o Shortness Of Breath: STAT if SOB developed within the last 24 hours or pt is noticeably SOB on the phone  1. Are you currently SOB (can you hear that pt is SOB on the phone)? no  2. How long have you been experiencing SOB? Couple of months  3. Are you SOB when sitting or when up moving around? Up moving around   4. Are you currently experiencing any other symptoms? Patient stating she has no energy. Patient states he has back pain around the heart area (not in the front chest area).   Pt c/o swelling/edema: STAT if pt has developed SOB within 24 hours  If swelling, where is the swelling located? Ankles and feet   How much weight have you gained and in what time span? 35lbs in one month   Have you gained 2 pounds in a day or 5 pounds in a week? She thinks so.   Do you have a log of your daily weights (if so, list)? no  Are you currently taking a fluid pill? no  Are you currently SOB? Yes, has been going on for several months  Have you traveled recently in a car or plane for an extended period of time? No Patient states she had her lap band loosened in May.

## 2022-12-14 NOTE — Telephone Encounter (Signed)
Patient not currently SOB. Pt reports "when she presses into her leg it leaves a mark". Pt confirmed weight gain of 35lbs in  but did not have log of weights. No other symptoms reported at this time. Appt with APP scheduled. Reviewed ER precautions with pt. Pt verbalized understanding. No further questions at this time.

## 2022-12-15 ENCOUNTER — Ambulatory Visit: Payer: Medicare HMO | Attending: Cardiology | Admitting: Cardiology

## 2022-12-15 ENCOUNTER — Encounter: Payer: Self-pay | Admitting: Cardiology

## 2022-12-15 VITALS — BP 124/80 | HR 71 | Ht 60.0 in | Wt 261.0 lb

## 2022-12-15 DIAGNOSIS — E785 Hyperlipidemia, unspecified: Secondary | ICD-10-CM | POA: Diagnosis not present

## 2022-12-15 DIAGNOSIS — M7989 Other specified soft tissue disorders: Secondary | ICD-10-CM | POA: Diagnosis not present

## 2022-12-15 DIAGNOSIS — I2119 ST elevation (STEMI) myocardial infarction involving other coronary artery of inferior wall: Secondary | ICD-10-CM

## 2022-12-15 DIAGNOSIS — I2510 Atherosclerotic heart disease of native coronary artery without angina pectoris: Secondary | ICD-10-CM | POA: Diagnosis not present

## 2022-12-15 DIAGNOSIS — I10 Essential (primary) hypertension: Secondary | ICD-10-CM | POA: Diagnosis not present

## 2022-12-15 DIAGNOSIS — Z9861 Coronary angioplasty status: Secondary | ICD-10-CM

## 2022-12-15 DIAGNOSIS — R0609 Other forms of dyspnea: Secondary | ICD-10-CM

## 2022-12-15 DIAGNOSIS — M546 Pain in thoracic spine: Secondary | ICD-10-CM

## 2022-12-15 MED ORDER — TORSEMIDE 20 MG PO TABS: ORAL_TABLET | ORAL | 3 refills | Status: AC

## 2022-12-15 MED ORDER — POTASSIUM CHLORIDE ER 10 MEQ PO TBCR
EXTENDED_RELEASE_TABLET | ORAL | 3 refills | Status: AC
Start: 2022-12-15 — End: ?

## 2022-12-15 NOTE — Progress Notes (Signed)
Cardiology Office Note:  .   Date:  12/15/2022  ID:  Sandra Beasley, DOB 05/21/1950, MRN [id number] PCP: Daisy Floro, MD  [facility] Providers Cardiologist:  Bryan Lemma, MD  History of Present Illness: Sandra Beasley is a 72 y.o. female with a past medical history of CAD, HTN, history of PE, type 2 DM, HLD, iron deficiency anemia. Patient is followed by Dr. Herbie Baltimore and presents today for evaluation of shortness of breath and lower extremity edema.  Patient previously had a STEMI in 2014 and received 2 overlapping DES to distal RCA-PDA. There was also disease in OM1 and LAD that were medically managed. Underwent nuclear stress test in 02/2018 that was a normal, low risk study without evidence of ischemia. Patient was later admitted in 11/2020 with bilateral PE and bilateral LE DVTs. She was started on eliquis. Echocardiogram 11/10/20 showed EF 55-60%, no wall motion abnormalities, grade I DD with severe LVH, severely reduced RV function, moderately elevated pulmonary artery systolic pressure.   Echocardiogram 02/17/21 showed EF 65-70%, no regional wall motio abnormalities, grade I DD, normal RV function.   Patient was last seen by Dr. Herbie Baltimore on 02/23/22. At that time, patient was doing well overall. She was going to [employer] at [employer], but had to stop due to dyspnea and leg fatigue. Had stable lower extremity edema, no PND or orthopnea. She was being workup by GI for iron deficiency anemia.   Patient called the office on 11/6 complaining of ankle swelling. Reportedly had gained 35 lbs in 1 month. She denied shortness of breath. Was scheduled for an appointment in office  Today, patient presents with concerns about difficulty in controlling their health conditions. Approximately a year ago, they were diagnosed with blood clots in their legs/lungs and anemia, and have since been on Eliquis. They report adherence to the medication regimen. 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 The band was subsequently loosened, leading to a rapid weight gain of thirty pounds within a month. This sudden weight gain was associated with a rise in blood pressure and blood sugar levels, and a return of breathing difficulties  The patient has been working with their PCP to manage their blood pressure and blood sugar, and is currently on amlodipine, Coreg, and repaglinide. Despite these efforts, they continue to struggle with blood sugar control. However, they report a recent improvement in blood pressure readings.   Additionally, the patient reports mid-back pain, located directly behind the heart, which has been causing them distress. They deny any chest pain or symptoms similar to their past heart attack. They also report crackling in their lungs, particularly noticeable when lying down to sleep, but deny any difficulty breathing in this position.  The patient also reports swelling in their legs, which they have not experienced since their previous blood clots. They have been off diuretics due to the inconvenience of frequent urination. They have recently started on Mounjaro for both weight control and diabetes management.  The patient expresses concern about their weight gain since the loosening of the lap band, and is unsure about whether to have the band tightened again due to the associated discomfort and vomiting. They have been on Mounjaro for three weeks and are tolerating it well. They are also following a low salt diet to manage their blood pressure.  ROS: Patient denies chest pain, palpitations, dizziness. Has had lower extremity swelling, shortness of breath,  back pain, weight gain.   Studies Reviewed: .   Cardiac Studies & Procedures     STRESS TESTS  MYOCARDIAL PERFUSION IMAGING 03/01/2018  Narrative  The left ventricular ejection fraction is hyperdynamic (>65%).  Nuclear stress EF: 74%.  There was  no ST segment deviation noted during stress.  No T wave inversion was noted during stress.  The study is normal.  This is a low risk study.   ECHOCARDIOGRAM  ECHOCARDIOGRAM COMPLETE 02/17/2021  Narrative ECHOCARDIOGRAM REPORT    Patient Name:   Sandra Beasley Date of Exam: 02/17/2021 Medical Rec #:  [id number]       Height:       62.0 in Accession #:    [id number]      Weight:       225.0 lb Date of Birth:  1950/11/26        BSA:          2.010 m Patient Age:    70 years        BP:           160/88 mmHg Patient Gender: F               HR:           60 bpm. Exam Location:  [address]  Procedure: 2D Echo, 3D Echo, Cardiac Doppler, Color Doppler and Strain Analysis  Indications:    I26.02 Pulmoanry embolus  History:        Patient has prior history of Echocardiogram examinations, most recent 11/10/2020. CAD and Previous Myocardial Infarction; Risk Factors:Hypertension, Diabetes and Dyslipidemia. Morbid obesity. Pulmonary embolus.  Sonographer:    Jorje Guild BS, RDCS Referring Phys: [id number] Martina Sinner  IMPRESSIONS   1. Left ventricular ejection fraction, by estimation, is 65 to 70%. The left ventricle has normal function. The left ventricle has no regional wall motion abnormalities. Left ventricular diastolic parameters are consistent with Grade I diastolic dysfunction (impaired relaxation). The average left ventricular global longitudinal strain is -27.2 %. The global longitudinal strain is normal. 2. Right ventricular systolic function is normal. The right ventricular size is normal. Tricuspid regurgitation signal is inadequate for assessing PA pressure. 3. Left atrial size was mild to moderately dilated. 4. The mitral valve is normal in structure. No evidence of mitral valve regurgitation. No evidence of mitral stenosis. 5. The aortic valve is tricuspid. Aortic valve regurgitation is not visualized. No aortic stenosis is present. 6. Aortic dilatation noted. There  is mild dilatation of the ascending aorta, measuring 37 mm. 7. The inferior vena cava is normal in size with greater than 50% respiratory variability, suggesting right atrial pressure of 3 mmHg.  Comparison(s): 11/10/20 EF 55-60%. PA pressure .  FINDINGS Left Ventricle: Left ventricular ejection fraction, by estimation, is 65 to 70%. The left ventricle has normal function. The left ventricle has no regional wall motion abnormalities. The average left ventricular global longitudinal strain is -27.2 %. The global longitudinal strain is normal. The left ventricular internal cavity size was normal in size. There is no left ventricular hypertrophy. Left ventricular diastolic parameters are consistent with Grade I diastolic dysfunction (impaired relaxation).  Right Ventricle: The right ventricular size is normal. No increase in right ventricular wall thickness. Right ventricular systolic function is normal. Tricuspid regurgitation signal is inadequate for assessing PA pressure.  Left Atrium: Left atrial size was mild to moderately dilated.  Right Atrium: Right atrial size was normal in size.  Pericardium: There is no evidence of pericardial  effusion.  Mitral Valve: The mitral valve is normal in structure. No evidence of mitral valve regurgitation. No evidence of mitral valve stenosis.  Tricuspid Valve: The tricuspid valve is normal in structure. Tricuspid valve regurgitation is not demonstrated.  Aortic Valve: The aortic valve is tricuspid. Aortic valve regurgitation is not visualized. No aortic stenosis is present.  Pulmonic Valve: The pulmonic valve was normal in structure. Pulmonic valve regurgitation is not visualized.  Aorta: Aortic dilatation noted. There is mild dilatation of the ascending aorta, measuring 37 mm.  Venous: The inferior vena cava is normal in size with greater than 50% respiratory variability, suggesting right atrial pressure of 3 mmHg.  IAS/Shunts: No atrial level  shunt detected by color flow Doppler.   LEFT VENTRICLE PLAX 2D LVIDd:         5.10 cm   Diastology LVIDs:         2.90 cm   LV e' medial:    8.16 cm/s LV PW:         1.20 cm   LV E/e' medial:  10.7 LV IVS:        1.00 cm   LV e' lateral:   11.10 cm/s LVOT diam:     2.30 cm   LV E/e' lateral: 7.9 LV SV:         121 LV SV Index:   60        2D Longitudinal Strain LVOT Area:     4.15 cm  2D Strain GLS Avg:     -27.2 %  3D Volume EF: 3D EF:        56 % LV EDV:       115 ml LV ESV:       50 ml LV SV:        65 ml  RIGHT VENTRICLE             IVC RV Basal diam:  3.20 cm     IVC diam: 1.66 cm RV S prime:     18.10 cm/s TAPSE (M-mode): 2.6 cm  LEFT ATRIUM             Index        RIGHT ATRIUM           Index LA diam:        4.20 cm 2.09 cm/m   RA Pressure: 3.00 mmHg LA Vol (A2C):   66.5 ml 33.09 ml/m  RA Area:     16.20 cm LA Vol (A4C):   97.7 ml 48.61 ml/m  RA Volume:   37.80 ml  18.81 ml/m LA Biplane Vol: 81.5 ml 40.55 ml/m AORTIC VALVE LVOT Vmax:   117.00 cm/s LVOT Vmean:  80.700 cm/s LVOT VTI:    0.292 m  AORTA Ao Root diam: 3.60 cm Ao Asc diam:  3.70 cm  MITRAL VALVE                TRICUSPID VALVE Estimated RAP:  3.00 mmHg MV Decel Time: 254 msec MV E velocity: 87.50 cm/s   SHUNTS MV A velocity: 115.00 cm/s  Systemic VTI:  0.29 m MV E/A ratio:  0.76         Systemic Diam: 2.30 cm  Dalton McleanMD Electronically signed by Wilfred Lacy Signature Date/Time: 02/17/2021/4:49:09 PM    Final             Risk Assessment/Calculations:             Physical Exam:   VS:  BP 124/80 (BP Location: Right Wrist, Patient Position: Sitting, Cuff Size: Normal)   Pulse 71   Ht 5' (1.524 m)   Wt 261 lb (118.4 kg)   BMI 50.97 kg/m    Wt Readings from Last 3 Encounters:  12/15/22 261 lb (118.4 kg)  07/22/22 253 lb 14.4 oz (115.2 kg)  05/30/22 246 lb 2 oz (111.6 kg)    GEN: Well nourished, well developed in no acute distress. Sitting comfortably in the chair in no  acute distress  NECK: No JVD CARDIAC: RRR, no murmurs, rubs, gallops. Radial pulses 2+ bilaterally  RESPIRATORY:  Crackles in bilateral lung bases. Normal work of breathing on room air  ABDOMEN: Soft, non-tender, non-distended EXTREMITIES:  2+ edema in BLE; No deformity   ASSESSMENT AND PLAN: .    Lower Extremity Edema  Shortness of breath  - Patient reports having lower extremity swelling and shortness of breath on exertion.  - Most recent echocardiogram from 02/2021 showed EF 65-70%, no regional wall motion abnormalities, grade I DD, normal RV function  - Patient was previously on hydrochlorothiazide, but asked to be taken off due to frequent urination  - With new/worsening ankle edema and shortness of breath, ordered echocardiogram  - Patient does not want to be on diuretics every day due to frequent urination. Discussed use of PRN diuretics, which patient is willing to try  - Start torsemide 20 mg PRN for ankle swelling. Start K 10 mEq daily on days that she takes torsemide. Instructed her to take torsemide 20 mg and K 10 mEq daily for the next 3 days to get her closer to euvolemia  - Patient reports excellent compliance with eliqus, DVT unlikely  - Discussed low sodium diets  - Ordered BMP today and repeat in 2 weeks after staring torsemide   CAD  - Previously had inferior STEMI in 2014 and received 2 overlapping DES to the RCA. Had residual disease in the OM1 and LAD that is managed medically  - Patient denies chest pain. No symptoms similar to her heart attack in the past  - Not on ASA as she is is on eliquis  - Continue amlodipine 5 mg daily, carvedilol 25 mg BID, crestor 10 mg daily   Back Pain  - Patient reports pain in the center of her back, near where her heart is. Concerned that it could be pain from her heart  - Pain is reproducible on palpation. She reports first noticing the back pain during a massage because it hurt when she was touched over her mid back  - Suspect  musculoskeletal pain- recommended PCP follow up   HLD  - Lipid panel from 04/2022 showed LDL 68  - Continue crestor 10 mg daily   History of DVT, PE  - Occurred in 2022 after a flight to [place] - Continue eliquis - patient reports excellent compliance and has not missed any doses   HTN  - BP well controlled on exam today. Reports that she sometimes checks her BP at home before taking her medications, and it is sometimes elevated. Unsure what her BP is after taking medications  - Continue amlodipine 5 mg daily, candesartan 32 mg daily, carvedilol 25 mg BID - Ordered BMP for medication monitoring  - Instructed patient to maintain BP log  - Discussed low sodium diets and the mediterranean diet   Obesity  - Patient recently had her lap band loosened and gained 30+ lbs in one month  - Now on mounjaro- has been  on the medication for 3 weeks. So far tolerating it well  - Discussed mediterranean diet and increasing activity as tolerated    Dispo: Follow up in   Signed, Jonita Albee, PA-C

## 2022-12-15 NOTE — Patient Instructions (Addendum)
Medication Instructions:  Torsemide 20 mg once a day as needed Potassium 10 MEq once a day with Torsemide as needed *If you need a refill on your cardiac medications before your next appointment, please call your pharmacy*   Lab Work: Today: BMP 2 Weeks: BMP If you have labs (blood work) drawn today and your tests are completely normal, you will receive your results only by: MyChart Message (if you have MyChart) OR A paper copy in the mail If you have any lab test that is abnormal or we need to change your treatment, we will call you to review the results.   Testing/Procedures: No testing   Follow-Up: At [facility], you and your health needs are our priority.  As part of our continuing mission to provide you with exceptional heart care, we have created designated Provider Care Teams.  These Care Teams include your primary Cardiologist (physician) and Advanced Practice Providers (APPs -  Physician Assistants and Nurse Practitioners) who all work together to provide you with the care you need, when you need it.  We recommend signing up for the patient portal called "MyChart".  Sign up information is provided on this After Visit Summary.  MyChart is used to connect with patients for Virtual Visits (Telemedicine).  Patients are able to view lab/test results, encounter notes, upcoming appointments, etc.  Non-urgent messages can be sent to your provider as well.   To learn more about what you can do with MyChart, go to [email or web address].    Your next appointment:   2-3 Months  Provider:   Robet Leu, PA-C  Other Instructions Keep blood pressure log at home and bring to next appointment

## 2022-12-16 ENCOUNTER — Telehealth: Payer: Self-pay

## 2022-12-16 LAB — BASIC METABOLIC PANEL
BUN/Creatinine Ratio: 15 (ref 12–28)
BUN: 22 mg/dL (ref 8–27)
CO2: 20 mmol/L (ref 20–29)
Calcium: 9.5 mg/dL (ref 8.7–10.3)
Chloride: 108 mmol/L — ABNORMAL HIGH (ref 96–106)
Creatinine, Ser: 1.45 mg/dL — ABNORMAL HIGH (ref 0.57–1.00)
Glucose: 139 mg/dL — ABNORMAL HIGH (ref 70–99)
Potassium: 4.3 mmol/L (ref 3.5–5.2)
Sodium: 144 mmol/L (ref 134–144)
eGFR: 38 mL/min/{1.73_m2} — ABNORMAL LOW (ref >59)

## 2022-12-16 NOTE — Telephone Encounter (Signed)
Left message to call back  

## 2022-12-16 NOTE — Telephone Encounter (Signed)
-----   Message from Jonita Albee sent at 12/16/2022  7:07 AM EST ----- Please tell patient that her lab work yesterday showed overall normal kidney function. Electrolytes normal. Blood sugar a bit elevated, but is better than it has been in the past.   Continue current medications. Remind her to return for BMP in 2 weeks as we discussed yesterday   Thanks KJ

## 2022-12-19 NOTE — Telephone Encounter (Signed)
Patient is returning CMA's call. Please advise.  

## 2022-12-19 NOTE — Telephone Encounter (Signed)
Patient identification verified by 2 forms. Marilynn Rail, RN   Called and spoke to patient  Relayed provider message below  Patient verbalized understanding, no questions at this time

## 2022-12-27 ENCOUNTER — Ambulatory Visit: Payer: Medicare HMO | Admitting: Gastroenterology

## 2022-12-27 ENCOUNTER — Encounter: Payer: Self-pay | Admitting: Gastroenterology

## 2022-12-27 VITALS — BP 124/72 | HR 65 | Ht 61.5 in | Wt 257.0 lb

## 2022-12-27 DIAGNOSIS — K219 Gastro-esophageal reflux disease without esophagitis: Secondary | ICD-10-CM

## 2022-12-27 DIAGNOSIS — Z860100 Personal history of colon polyps, unspecified: Secondary | ICD-10-CM

## 2022-12-27 DIAGNOSIS — Z9884 Bariatric surgery status: Secondary | ICD-10-CM

## 2022-12-27 NOTE — Progress Notes (Signed)
    Assessment     GERD, well-controlled.  Previously exacerbated by tight lap band Reactive gastropathy IDA resolved, likely d/t her bleeding colon polyp S/P lap band, now loosened Personal history of a 12 mm ulcerated, bleeding sessile serrated colon polyp  DM CAD, DES History of PE  BMI=47.77   Recommendations    Continue pantoprazole 40 mg qd or qod, follow antireflux measures Surveillance colonoscopy at 1 years, in Feb 2027 REV in 1 year with Dr. Doy Hutching   HPI    This is a 72 year old female returning for follow-up of GERD.  Her reflux symptoms improved dramatically after loosening of her Lap-Band however unfortunately she gained weight.  She tapered herself off pantoprazole and her reflux symptoms returned so pantoprazole 40 mg daily was restarted and now her reflux symptoms are under good control.  She started [facility] 1 month ago to help with weight loss.  She is concerned Mounjaro and I want provide substantial weight loss and she wonders about having her lap band retightened.   Labs / Imaging       Latest Ref Rng & Units 01/19/2022   10:03 AM 12/08/2021   10:10 AM 10/26/2021    8:33 AM  Hepatic Function  Total Protein 6.5 - 8.1 g/dL 6.3  7.0  6.4   Albumin 3.5 - 5.0 g/dL 3.7  3.8  3.8   AST 15 - 41 U/L 9  9  10    ALT 0 - 44 U/L 11  9  8    Alk Phosphatase 38 - 126 U/L 52  51  49   Total Bilirubin 0.3 - 1.2 mg/dL 0.6  0.6  0.6        Latest Ref Rng & Units 10/12/2022   10:05 AM 07/22/2022   10:00 AM 04/22/2022    8:55 AM  CBC  WBC 4.0 - 10.5 K/uL 5.8  5.7  6.3   Hemoglobin 12.0 - 15.0 g/dL 16.1  09.6  04.5   Hematocrit 36.0 - 46.0 % 38.6  39.2  39.0   Platelets 150 - 400 K/uL 172  191  170     Current Medications, Allergies, Past Medical History, Past Surgical History, Family History and Social History were reviewed in [employer] record.   Physical Exam: General: Well developed, well nourished, no acute distress Head: Normocephalic  and atraumatic Eyes: Sclerae anicteric, EOMI Ears: Normal auditory acuity Mouth: No deformities or lesions noted Lungs: Clear throughout to auscultation Heart: Regular rate and rhythm; No murmurs, rubs or bruits Abdomen: Soft, non tender and non distended. No masses, hepatosplenomegaly or hernias noted. Normal Bowel sounds Rectal: Not done Musculoskeletal: Symmetrical with no gross deformities  Pulses:  Normal pulses noted Extremities: No edema or deformities noted Neurological: Alert oriented x 4, grossly nonfocal Psychological:  Alert and cooperative. Normal mood and affect   Anhad Sheeley T. Russella Dar, MD 12/27/2022, 10:23 AM

## 2022-12-27 NOTE — Patient Instructions (Signed)
Remain on pantoprazole daily or every other day.   Follow up with Dr. Doy Hutching in one year.  The [facility] providers would like to encourage you to use [facility] to communicate with providers for non-urgent requests or questions.  Due to long hold times on the telephone, sending your provider a message by [facility] may be a faster and more efficient way to get a response.  Please allow 48 business hours for a response.  Please remember that this is for non-urgent requests.   Thank you for choosing me and [facility].  Venita Lick. Pleas Koch., MD., Clementeen Graham

## 2022-12-29 ENCOUNTER — Telehealth: Payer: Self-pay | Admitting: Cardiology

## 2022-12-29 DIAGNOSIS — I2510 Atherosclerotic heart disease of native coronary artery without angina pectoris: Secondary | ICD-10-CM | POA: Diagnosis not present

## 2022-12-29 DIAGNOSIS — Z9861 Coronary angioplasty status: Secondary | ICD-10-CM | POA: Diagnosis not present

## 2022-12-29 DIAGNOSIS — I10 Essential (primary) hypertension: Secondary | ICD-10-CM | POA: Diagnosis not present

## 2022-12-29 DIAGNOSIS — I2119 ST elevation (STEMI) myocardial infarction involving other coronary artery of inferior wall: Secondary | ICD-10-CM | POA: Diagnosis not present

## 2022-12-29 NOTE — Telephone Encounter (Signed)
Paper Work Dropped Off: BP readings  Date: 12-29-22  Location of paper:  Dr. Elissa Hefty mailbox  Dorma Russell

## 2022-12-30 ENCOUNTER — Telehealth: Payer: Self-pay

## 2022-12-30 LAB — BASIC METABOLIC PANEL
BUN/Creatinine Ratio: 14 (ref 12–28)
BUN: 20 mg/dL (ref 8–27)
CO2: 19 mmol/L — ABNORMAL LOW (ref 20–29)
Calcium: 9.4 mg/dL (ref 8.7–10.3)
Chloride: 106 mmol/L (ref 96–106)
Creatinine, Ser: 1.48 mg/dL — ABNORMAL HIGH (ref 0.57–1.00)
Glucose: 203 mg/dL — ABNORMAL HIGH (ref 70–99)
Potassium: 4.4 mmol/L (ref 3.5–5.2)
Sodium: 142 mmol/L (ref 134–144)
eGFR: 37 mL/min/{1.73_m2} — ABNORMAL LOW (ref >59)

## 2022-12-30 NOTE — Telephone Encounter (Signed)
-----   Message from Jonita Albee sent at 12/30/2022  8:18 AM EST ----- Please tell patient that her lab work showed stable kidney function and normal potassium since starting torsemide as needed. No changes to medications at this time   Lab work did show elevated blood glucose- continue to follow up with PCP   Thanks KJ

## 2022-12-30 NOTE — Telephone Encounter (Signed)
Called to advised of below they verbalized understanding Patient had taken torsemide and overnight had lost 11 lbs and is feeling better She wanted to know if the sudden weight loss could be a indicator of her chronic heart failure.

## 2023-01-02 NOTE — Telephone Encounter (Signed)
Likely indicates that she had extra fluid on her that the torsemide helped her get out of her system. When I saw her in clinic on 11/7, she did have lower extremity swelling and some crackles (possible fluid) in her lungs. Possible that she is having some weight loss on her mounjaro as well. Overall, glad she is feeling better  Thanks KJ   Called patient advised of above they verbalized understanding.

## 2023-01-11 ENCOUNTER — Inpatient Hospital Stay: Payer: Medicare HMO | Admitting: Hematology and Oncology

## 2023-01-11 ENCOUNTER — Inpatient Hospital Stay: Payer: Medicare HMO | Attending: Physician Assistant

## 2023-01-11 VITALS — BP 172/87 | HR 68 | Temp 97.7°F | Resp 18 | Ht 61.5 in | Wt 260.2 lb

## 2023-01-11 DIAGNOSIS — E538 Deficiency of other specified B group vitamins: Secondary | ICD-10-CM | POA: Insufficient documentation

## 2023-01-11 DIAGNOSIS — Z86718 Personal history of other venous thrombosis and embolism: Secondary | ICD-10-CM | POA: Insufficient documentation

## 2023-01-11 DIAGNOSIS — Z9884 Bariatric surgery status: Secondary | ICD-10-CM | POA: Insufficient documentation

## 2023-01-11 DIAGNOSIS — Z7901 Long term (current) use of anticoagulants: Secondary | ICD-10-CM | POA: Diagnosis not present

## 2023-01-11 DIAGNOSIS — D508 Other iron deficiency anemias: Secondary | ICD-10-CM

## 2023-01-11 DIAGNOSIS — D509 Iron deficiency anemia, unspecified: Secondary | ICD-10-CM | POA: Diagnosis not present

## 2023-01-11 DIAGNOSIS — Z803 Family history of malignant neoplasm of breast: Secondary | ICD-10-CM | POA: Diagnosis not present

## 2023-01-11 DIAGNOSIS — Z86711 Personal history of pulmonary embolism: Secondary | ICD-10-CM | POA: Diagnosis not present

## 2023-01-11 LAB — CBC WITH DIFFERENTIAL (CANCER CENTER ONLY)
Abs Immature Granulocytes: 0.02 10*3/uL (ref 0.00–0.07)
Basophils Absolute: 0 10*3/uL (ref 0.0–0.1)
Basophils Relative: 1 %
Eosinophils Absolute: 0.2 10*3/uL (ref 0.0–0.5)
Eosinophils Relative: 3 %
HCT: 40.1 % (ref 36.0–46.0)
Hemoglobin: 12.3 g/dL (ref 12.0–15.0)
Immature Granulocytes: 0 %
Lymphocytes Relative: 20 %
Lymphs Abs: 1.2 10*3/uL (ref 0.7–4.0)
MCH: 27.8 pg (ref 26.0–34.0)
MCHC: 30.7 g/dL (ref 30.0–36.0)
MCV: 90.7 fL (ref 80.0–100.0)
Monocytes Absolute: 0.4 10*3/uL (ref 0.1–1.0)
Monocytes Relative: 7 %
Neutro Abs: 4.2 10*3/uL (ref 1.7–7.7)
Neutrophils Relative %: 69 %
Platelet Count: 182 10*3/uL (ref 150–400)
RBC: 4.42 MIL/uL (ref 3.87–5.11)
RDW: 14 % (ref 11.5–15.5)
WBC Count: 6.1 10*3/uL (ref 4.0–10.5)
nRBC: 0 % (ref 0.0–0.2)

## 2023-01-11 LAB — IRON AND IRON BINDING CAPACITY (CC-WL,HP ONLY)
Iron: 50 ug/dL (ref 28–170)
Saturation Ratios: 17 % (ref 10.4–31.8)
TIBC: 304 ug/dL (ref 250–450)
UIBC: 254 ug/dL (ref 148–442)

## 2023-01-11 LAB — FERRITIN: Ferritin: 19 ng/mL (ref 11–307)

## 2023-01-11 LAB — VITAMIN B12: Vitamin B-12: 1273 pg/mL — ABNORMAL HIGH (ref 180–914)

## 2023-01-11 NOTE — Progress Notes (Signed)
[facility] [phone]   [phone]  PROGRESS NOTE  Patient Care Team: Daisy Floro, MD as PCP - General (Family Medicine) Marykay Lex, MD as PCP - Cardiology (Cardiology)  Hematological/Oncological History -11/09/2020-11/13/2020: Admitted due to progressive dyspnea.  CT angiogram of the chest showed extensive bilateral pulmonary emboli with associated right heart strain.  Doppler ultrasound revealed acute DVT involving the left posterior tibial veins, left peroneal veins and right posterior tibial veins.  Patient was started on IV heparin drip and transition to Eliquis.    -05/17/2021: Establish care with Georga Kaufmann PA-C  -09/22/2021-10/20/2021: Received IV venofer 200 mg once a week x 5 doses  -12/15/2021: Received IV venofer 200 mg x 1 dose  -01/28/2022-02/09/2022: Received IV venofer 200 mg x 2 doses.   CHIEF COMPLAINTS/PURPOSE OF CONSULTATION:  -H/O bilateral DVT and pulmonary emboli -Iron deficiency anemia/folate deficiency/vitamin B12 deficiency  HISTORY OF PRESENTING ILLNESS:  Sandra Beasley 72 y.o. female returns for a follow up for iron, B12 and folate deficiency. She is unaccompanied for this visit.  He was last seen on 07/22/2022.  In the interim she has had no major changes in her health.  On exam today,Sandra Beasley reports she has been having some increasing shortness of breath with exercise.  She reports that she is not feeling lethargic but only when she exerts herself becomes very short of breath.  She reports that she can only walk about 50 yards.  She reports that it is her breathing that gives out and limits her.  She notes that she is not currently in any pain.  She was prescribed torsemide by her cardiologist PA and notes that it did help with her lower extremity swelling and some with her shortness of breath.  She reports that she is only had it for a few days.  She reports that she has not recently had an echocardiogram and  her last one on file is from January 2023.  She reports that she will talk to her cardiologist about this.  She notes that her Eliquis 2.5 mg twice daily is not causing any trouble.  She is taking it faithfully with no bleeding, bruising, or dark stools.  She reports the cost is $145 per month, but is aware that the price may drop next year with Medicare.  She reports that they did loosen the gastric band in her stomach and that has been causing considerable weight gain.  She was started on Mounjaro in order to help with weight loss.  Otherwise she does not have any questions concerns or complaints today.. She denies fevers, chills, sweats, chest pain or cough. She has no other complaints. Rest of the 10 point ROS is below  MEDICAL HISTORY:  Past Medical History:  Diagnosis Date   Adenomatous polyp of colon 09/2005   Bilateral pulmonary embolism (HCC) 11/09/2020   PE protocol CTA 11/09/2020: Extensive bilateral pulmonary artery thrombus within numerous bilateral upper lobe, right middle lobe and bilateral lower lobe branches. No saddle PE. Mild cardiomegaly with right-sided heart strain.  McKeown signs seen on TTE with severely reduced RV function, IVS flattening in S&D (RV P & Vol Overload) - PAP ~ 57 mmHg. IAS Bowel to L - high RAP. + McConnell Sign   CAD S/P percutaneous coronary angioplasty - DES x2 in RCA 01/02/2013; 02/2013   a. 100% RPDA - Xience Xpedition DES 2.25 mm x 15 mm + 2.25 mm x 8 mm overlapping, mod- severe distal LAD and  prox OM1 lesions.;b.  Myoview 02/2013 & Jan 2020: No ischemia or Infarction, Normal EF.   DVT of lower extremity, bilateral (HCC) 11/2020   Presented with bilateral PEs.  Noted to have bilateral popliteal vein DVTs-follow on a trip to and from [place].   GERD (gastroesophageal reflux disease) 03/06/2013   Hiatal hernia without gangrene and obstruction 11/2020   CTA of the chest showed: Moderate size hiatal hernia with evidence of prior Lap-Band. Also noted cholelithiasis    Hyperlipidemia associated with type 2 diabetes mellitus (HCC)    Hypertension    Obesity    STEMI (ST elevation myocardial infarction) (HCC) 01/02/2013   --> PCI; EF 60 and 65%. Gr 1 DD & No regional WMA, Otherwise relatively normal.   Type 2 diabetes mellitus with circulatory disorder (HCC)    Vitamin D deficiency     SURGICAL HISTORY: Past Surgical History:  Procedure Laterality Date   ABDOMINAL HYSTERECTOMY     CORONARY ANGIOPLASTY WITH STENT PLACEMENT  01/02/2013   PCI-RCA: Xience Xpedition 2.25 mm x 15 mm, 2.25 mm x 8 mm (2.4 mm)   LAPAROSCOPIC GASTRIC BANDING  2006   LAPAROSCOPIC HYSTERECTOMY  1996   LEFT HEART CATHETERIZATION WITH CORONARY ANGIOGRAM N/A 01/02/2013   Procedure: LEFT HEART CATHETERIZATION WITH CORONARY ANGIOGRAM;  Surgeon: Marykay Lex, MD;  Location: [facility] CATH LAB;: Inferior STEMI: 100% PDA; mid-distal LAD beyond D1 (small caliber) 80%.  Ostial D1 40%.  Lateral OM 1 with 70-80% stenosis.  Large branching ramus intermedius with OM and DIAG branch branches.   NM MYOVIEW LTD  01/'15; 1/'20   a) EF 60-65%.  Normal wall motion.  No ischemia or infarction.; b) EF 70 to 75%.  No EKG changes.  No evidence of ischemia or infarction.:   TONSILLECTOMY  1966   TRANSTHORACIC ECHOCARDIOGRAM  01/04/2013   (Post inferior STEMI) EF 60 and 65%. Grade 1 diastolic dysfunction with no regional wall motion abnormalities. Otherwise relatively normal.   TRANSTHORACIC ECHOCARDIOGRAM  11/10/2020   (In setting of PE) EF 55 to 60%.  No RWMA.  GR 1 DD.  IVS flattening in systole and diastole consistent with RV pressure volume overload.  Severely reduced RV function with mildly elevated PAP roughly 57 mmHg.  Normal atrial sizes, I-S bowing to the left suggesting high RAP.  McConnell sign also noted suggesting a pulmonary embolus.   TRANSTHORACIC ECHOCARDIOGRAM  02/23/2021   3 months post PE: EF 65 to 70%.  No RWMA.  GR 1 DD with moderate LA dilation.  Unable to assess PAP.  Normal aortic and  mitral valves.  Ascending Aorta measured at 37 mmHg. Comparison(s): 11/10/20 EF 55-60%. PA pressure    SOCIAL HISTORY: Social History   Socioeconomic History   Marital status: Divorced    Spouse name: Not on file   Number of children: 3   Years of education: Not on file   Highest education level: Not on file  Occupational History   Occupation: Teacher  Tobacco Use   Smoking status: Never   Smokeless tobacco: Never  Vaping Use   Vaping status: Never Used  Substance and Sexual Activity   Alcohol use: No   Drug use: No   Sexual activity: Not on file  Other Topics Concern   Not on file  Social History Narrative   She is a divorced nonsmoker who works for [employer]. She is currently now in cardiac rehabilitation. Has changed her dietary habits to a Vegan lifestyle.   Social Determinants of  Health   Financial Resource Strain: Not on file  Food Insecurity: Not on file  Transportation Needs: Not on file  Physical Activity: Not on file  Stress: Not on file  Social Connections: Not on file  Intimate Partner Violence: Not on file    FAMILY HISTORY: Family History  Adopted: Yes  Problem Relation Age of Onset   Breast cancer Maternal Aunt    Diabetes Maternal Aunt    Heart disease Maternal Aunt    Diabetes Maternal Uncle    Diabetes gravidarum Maternal Uncle    Colon cancer Neg Hx    Stomach cancer Neg Hx    Rectal cancer Neg Hx     ALLERGIES:  is allergic to ace inhibitors, actos [pioglitazone], other, and statins.  MEDICATIONS:  Current Outpatient Medications  Medication Sig Dispense Refill   amLODipine (NORVASC) 5 MG tablet Take 5 mg by mouth daily.     candesartan (ATACAND) 32 MG tablet Take 32 mg by mouth daily.     carvedilol (COREG) 25 MG tablet Take 25 mg by mouth 2 (two) times daily.     Cholecalciferol (VITAMIN D3) 25 MCG (1000 UT) capsule 1 capsule Orally Once a day     ELIQUIS 2.5 MG TABS tablet Take 2.5 mg by mouth 2 (two) times daily.      metFORMIN (GLUCOPHAGE) 500 MG tablet Take 2 by mouth in am and 1 in pm     metroNIDAZOLE (METROCREAM) 0.75 % cream Apply 1 application topically 2 (two) times daily as needed (rosacea). Apply to face     MOUNJARO 2.5 MG/0.5ML Pen Inject 2.5 mg into the skin.     nitroGLYCERIN (NITROSTAT) 0.4 MG SL tablet Place 1 tablet (0.4 mg total) under the tongue every 5 (five) minutes as needed for chest pain. 25 tablet 1   pantoprazole (PROTONIX) 40 MG tablet Take 1 tablet (40 mg total) by mouth daily. (Patient taking differently: Take 40 mg by mouth every other day.) 30 tablet 1   potassium chloride (KLOR-CON) 10 MEQ tablet Take 10 meq once a day as needed with Torsemide. 90 tablet 3   repaglinide (PRANDIN) 0.5 MG tablet Take 0.5 mg by mouth 3 (three) times daily before meals.     rosuvastatin (CRESTOR) 10 MG tablet Take 10 mg by mouth daily.  4   torsemide (DEMADEX) 20 MG tablet Take 20 mg once a day as needed 90 tablet 3   triamcinolone cream (KENALOG) 0.1 % Apply 1 application  topically 2 (two) times daily as needed (rash/irritation).     No current facility-administered medications for this visit.    REVIEW OF SYSTEMS:   Constitutional: ( - ) fevers, ( - )  chills , ( - ) night sweats Eyes: ( - ) blurriness of vision, ( - ) double vision, ( - ) watery eyes Ears, nose, mouth, throat, and face: ( - ) mucositis, ( - ) sore throat Respiratory: ( - ) cough, ( + ) dyspnea, ( - ) wheezes Cardiovascular: ( - ) palpitation, ( - ) chest discomfort, (+ ) lower extremity swelling Gastrointestinal:  ( - ) nausea, ( - ) heartburn, ( - ) change in bowel habits Skin: ( - ) abnormal skin rashes Lymphatics: ( - ) new lymphadenopathy, ( - ) easy bruising Neurological: ( - ) numbness, ( - ) tingling, ( - ) new weaknesses Behavioral/Psych: ( - ) mood change, ( - ) new changes  All other systems were reviewed with the patient and are  negative.  PHYSICAL EXAMINATION: ECOG PERFORMANCE STATUS: 1 - Symptomatic but  completely ambulatory  Vitals:   01/11/23 1005 01/11/23 1006  BP: (!) 176/78 (!) 172/87  Pulse: 68   Resp: 18   Temp: 97.7 F (36.5 C)   SpO2: 98%     Filed Weights   01/11/23 1005  Weight: 260 lb 3 oz (118 kg)     GENERAL: well appearing female in NAD SKIN: skin color, texture, turgor are normal, no rashes or significant lesions EYES: conjunctiva are pink and non-injected, sclera clear OROPHARYNX: no exudate, no erythema; lips, buccal mucosa, and tongue normal  LUNGS: clear to auscultation and percussion with normal breathing effort HEART: regular rate & rhythm and no murmurs. Trace bilateral lower extremity edema Musculoskeletal: no cyanosis of digits and no clubbing  PSYCH: alert & oriented x 3, fluent speech NEURO: no focal motor/sensory deficits  LABORATORY DATA:  I have reviewed the data as listed    Latest Ref Rng & Units 01/11/2023    9:35 AM 10/12/2022   10:05 AM 07/22/2022   10:00 AM  CBC  WBC 4.0 - 10.5 K/uL 6.1  5.8  5.7   Hemoglobin 12.0 - 15.0 g/dL 78.2  95.6  21.3   Hematocrit 36.0 - 46.0 % 40.1  38.6  39.2   Platelets 150 - 400 K/uL 182  172  191        Latest Ref Rng & Units 12/29/2022    2:54 PM 12/15/2022    4:01 PM 01/19/2022   10:03 AM  CMP  Glucose 70 - 99 mg/dL 086  578  469   BUN 8 - 27 mg/dL 20  22  17    Creatinine 0.57 - 1.00 mg/dL 6.29  5.28  4.13   Sodium 134 - 144 mmol/L 142  144  141   Potassium 3.5 - 5.2 mmol/L 4.4  4.3  3.1   Chloride 96 - 106 mmol/L 106  108  106   CO2 20 - 29 mmol/L 19  20  29    Calcium 8.7 - 10.3 mg/dL 9.4  9.5  9.5   Total Protein 6.5 - 8.1 g/dL   6.3   Total Bilirubin 0.3 - 1.2 mg/dL   0.6   Alkaline Phos 38 - 126 U/L   52   AST 15 - 41 U/L   9   ALT 0 - 44 U/L   11     ASSESSMENT & PLAN Sandra Beasley is a 72 y.o. female returns for a follow up for history of DVT/PE and nutritional deficiencies.    #H/O bilateral lower extremity DVT and bilateral pulmonary emboli with right heart strain: --Diagnosed  in October 2022 with CTA that showed extensive bilateral pulmonary emboli with associated right heart strain. Doppler US showed acute DVT involving the left posterior tibial veins, left peroneal veins and right posterior tibial veins. --Since there is no definitive provoking factor and due to the extensive thrombotic presentation, the recommendation is indefinite anticoagulation.  --Labs from 05/17/2021 ruled our antiphospholipid syndrome.  --Currently on Eliquis maintenance dose of 2.5 mg twice daily.   #Vitamin B12/folate/iron deficiencies: --Likely secondary to gastric banding but has history of erosive gastropathy and duodenal erosions, likely secondary to her lap band in 2019.  --Underwent EGD/colonoscopy in February 2024. Findings that could have caused IDA include gastritis associated with the lap band, ulcerative polyp in the distal transverse colon, internal hemorrhoids.  --Last received IV venofer 200 mg x 2 doses on 01/28/2022-02/09/2022.  --  Labs today shows WBC 6.1, Hgb 12.3, MCV 90.7, Plt 182 --No need for additional IV iron at this time. Okay to hold B12 injections at this time. Can consider restarting these if the levels are low on today's exam.  --RTC in 6 months for labs and follow up.   No orders of the defined types were placed in this encounter.   All questions were answered. The patient knows to call the clinic with any problems, questions or concerns.  I have spent a total of 25 minutes minutes of face-to-face and non-face-to-face time, preparing to see the patient, performing a medically appropriate examination, counseling and educating the patient, ordering tests, documenting clinical information in the electronic health record, and care coordination.   Ulysees Barns, MD Department of Hematology/Oncology [facility] Phone: [phone] Pager: [phone] Email: [email or web address]

## 2023-01-24 ENCOUNTER — Telehealth: Payer: Self-pay

## 2023-01-24 NOTE — Telephone Encounter (Signed)
-----   Message from Pollyann Samples sent at 01/24/2023  1:32 PM EST ----- Please let pt know labs: B12 >1000 continue to hold injections. Ferritin 19, with normal hgb/iron/tibc. She does not need IV iron at this time but I recommend checking iron studies in 3 months. Please schedule if she agrees.   Thanks, Clayborn Heron NP

## 2023-01-24 NOTE — Telephone Encounter (Signed)
Pt advised of lab results and recommendations.  She agreed to re check iron studies in 3 months.

## 2023-02-14 ENCOUNTER — Encounter: Payer: Self-pay | Admitting: Physician Assistant

## 2023-02-14 ENCOUNTER — Ambulatory Visit (INDEPENDENT_AMBULATORY_CARE_PROVIDER_SITE_OTHER): Payer: Medicare Other | Admitting: Podiatry

## 2023-02-14 ENCOUNTER — Encounter: Payer: Self-pay | Admitting: Podiatry

## 2023-02-14 VITALS — Ht 61.5 in

## 2023-02-14 DIAGNOSIS — E119 Type 2 diabetes mellitus without complications: Secondary | ICD-10-CM

## 2023-02-14 DIAGNOSIS — B351 Tinea unguium: Secondary | ICD-10-CM

## 2023-02-14 DIAGNOSIS — L84 Corns and callosities: Secondary | ICD-10-CM

## 2023-02-14 DIAGNOSIS — M79675 Pain in left toe(s): Secondary | ICD-10-CM | POA: Diagnosis not present

## 2023-02-14 DIAGNOSIS — M79674 Pain in right toe(s): Secondary | ICD-10-CM

## 2023-02-14 NOTE — Progress Notes (Signed)
  Subjective:  Patient ID: Sandra Beasley, female    DOB: [birth year],  MRN: [id number]  Chief Complaint  Patient presents with   [facility]    She is here for [facility], she thinks her A1C was around 8.      73 y.o. female returns for follow-up with the above complaint. History confirmed with patient.   Objective:  Physical Exam: warm, good capillary refill, no trophic changes or ulcerative lesions, normal DP and PT pulses, normal monofilament exam, normal sensory exam and onychomycosis.  Pinch callus bilateral hallux.  Thickening and ingrowing nails with subungual debris of multiple toenails Assessment:   1. Pain due to onychomycosis of toenails of both feet   2. Callus of foot   3. Type 2 diabetes mellitus without complication, without long-term current use of insulin  [facility])         Plan:  Patient was evaluated and treated and all questions answered.  Discussed the etiology and treatment options for the condition in detail with the patient. . Recommended debridement of the nails today. Sharp and mechanical debridement performed of all painful and mycotic nails today. Nails debrided in length and thickness using a nail nipper to level of comfort. Discussed treatment options including appropriate shoe gear. Follow up as needed for painful nails.    All symptomatic hyperkeratoses were safely debrided with a sterile #15 blade to patient's level of comfort without incident. We discussed preventative and palliative care of these lesions including supportive and accommodative shoegear, padding, prefabricated and custom molded accommodative orthoses, use of a pumice stone and lotions/creams daily.    Return in about 3 months (around 05/15/2023) for at risk diabetic foot care.

## 2023-02-17 ENCOUNTER — Encounter: Payer: Self-pay | Admitting: Physician Assistant

## 2023-02-24 ENCOUNTER — Encounter: Payer: Self-pay | Admitting: Cardiology

## 2023-02-24 ENCOUNTER — Ambulatory Visit: Payer: Medicare Other | Attending: Cardiology | Admitting: Cardiology

## 2023-02-24 ENCOUNTER — Encounter: Payer: Self-pay | Admitting: Physician Assistant

## 2023-02-24 VITALS — BP 150/76 | HR 66 | Ht 61.0 in | Wt 260.0 lb

## 2023-02-24 DIAGNOSIS — E1169 Type 2 diabetes mellitus with other specified complication: Secondary | ICD-10-CM

## 2023-02-24 DIAGNOSIS — E785 Hyperlipidemia, unspecified: Secondary | ICD-10-CM

## 2023-02-24 DIAGNOSIS — M25471 Effusion, right ankle: Secondary | ICD-10-CM

## 2023-02-24 DIAGNOSIS — Z7901 Long term (current) use of anticoagulants: Secondary | ICD-10-CM

## 2023-02-24 DIAGNOSIS — I2119 ST elevation (STEMI) myocardial infarction involving other coronary artery of inferior wall: Secondary | ICD-10-CM | POA: Diagnosis not present

## 2023-02-24 DIAGNOSIS — I2699 Other pulmonary embolism without acute cor pulmonale: Secondary | ICD-10-CM

## 2023-02-24 DIAGNOSIS — M25472 Effusion, left ankle: Secondary | ICD-10-CM

## 2023-02-24 DIAGNOSIS — I25118 Atherosclerotic heart disease of native coronary artery with other forms of angina pectoris: Secondary | ICD-10-CM

## 2023-02-24 DIAGNOSIS — I2510 Atherosclerotic heart disease of native coronary artery without angina pectoris: Secondary | ICD-10-CM

## 2023-02-24 DIAGNOSIS — Z9884 Bariatric surgery status: Secondary | ICD-10-CM

## 2023-02-24 DIAGNOSIS — I10 Essential (primary) hypertension: Secondary | ICD-10-CM

## 2023-02-24 DIAGNOSIS — D508 Other iron deficiency anemias: Secondary | ICD-10-CM

## 2023-02-24 MED ORDER — AMLODIPINE BESYLATE 10 MG PO TABS: 10.0000 mg | ORAL_TABLET | Freq: Every day | ORAL | 3 refills | Status: DC

## 2023-02-24 NOTE — Progress Notes (Unsigned)
Cardiology Office Note:  .   Date:  02/27/2023  ID:  Sandra Beasley, DOB 12-09-1950, MRN [id number] PCP: Daisy Floro, MD  [facility] Providers Cardiologist:  Bryan Lemma, MD     Chief Complaint  Patient presents with   Follow-up    34-month follow-up after APP visit for edema and dyspnea    Patient Profile: Sandra Beasley is a obese (s/p Lap Band) 73 y.o. female  with a PMH CAD-PCI (inferior STEMI), HTN, HLD, DM type II as well as bilateral PE (November 2022) who presents here for 52-month follow-up at the request of Daisy Floro, MD.  Pertinent PMH : CAD (Inferior STEMI 11/26/ 2014) => 2 overlapping DES to distal RCA-PDA, medical management of OM1 ~ 70% and 80% distal LAD lesion in a diminutive vessel),  MYOVIEW in 02/2013 & 02/2022 both NON-ISCHEMIC Morbid Obesity s/p lap band, with recent adjustment CRFs: HTN, HLD, DM2 H/o Bilateral PE-November 2022 on maintenance dose Eliquis    I last saw Sandra Beasley exactly 1 year ago on February 23, 2022 for routine visit.  She was doing very well.  Was normal getting 9 years out for MI.  Stable cardiac standpoint.  Enjoying teaching.  No angina or heart failure symptoms.  Limited fatigue and was found to have iron deficiency anemia treated with Feraheme feeling better.  Normal SPECT normal.  There is a leg fatigue but then it resolved somewhat.  With pending GI evaluation with upper and lower GI.  Trivial edema at the end of the day.  She was recently seen on November 7 by Robet Leu, PA for issues with BP, edema and dyspnea.  She called into the office today indicating that she was having some ankle swelling.  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  Less rapid weight gain but has led to poorly controlled blood sugars and BP as well as edema.  She was noticing some back pain in the upper chest but no chest pain-back  pain was reproducible on exam..  She was noting some crackling along with some orthopnea symptoms but no PND. PCP had initiated [facility] for weight loss and diabetes. BMP ordered and started on torsemide 20 mg PRN along with potassium supplement (she was taken daily for 3 days.  She was reluctant to take a standing dose diuretic. Instructed to continue with a blood pressure log. Plan for close follow-up  Subjective  Discussed the use of AI scribe software for clinical note transcription with the patient, who gave verbal consent to proceed.  History of Present Illness   The patient, with a history of myocardial infarction and stent placement in the right coronary artery over 10 years ago, presents with concerns of decreased stamina and increased shortness of breath, particularly during physical activity. Thankfully, she denies experiencing chest pain, pressure, or tightness, and reports no episodes of heart racing or skipping.  She also denies experiencing shortness of breath at rest, dizziness, lightheadedness, or stroke-like symptoms.   Her blood pressure has been noted to be high, despite being on maximum doses of two medications.  She has been experiencing edema, particularly in the right foot and occasionally in both feet.  She thinks it is likely related to a significant weight gain after having a gastric band loosened in the previous spring, which she believes may be contributing to her current  symptoms.  The patient has recently started on Mounjaro for weight management but reports no significant weight loss yet.   She is currently on amlodipine and candesartan for BP control, and rosuvastatin for cholesterol management.  She was prescribed PRN torsemide but has been relatively reluctant to take it-- usually when she notices significant swelling in her feet which is maybe once a week or so.    The patient's last cholesterol check was in March of the previous year, and she is due for a  recheck. The patient is scheduled to see her primary care provider in March.  The patient has a history of pulmonary embolism and deep vein thrombosis, for which she is currently on Eliquis. She also has a history of anemia, which was managed by the cancer center.     Most pertinent positives CV ROS is exertional dyspnea, orthopnea and edema  ROS:  Review of Systems - Negative except symptoms noted above.  Back pain seems to be doing better.  She is concerned about the weight gain and having not yet lost weight on the higher doses of.      Objective   Studies Reviewed: Marland Kitchen   EKG Interpretation Date/Time:  Friday February 24 2023 14:01:55 EST Ventricular Rate:  66 PR Interval:  224 QRS Duration:  88 QT Interval:  396 QTC Calculation: 415 R Axis:   50  Text Interpretation: Sinus rhythm with 1st degree A-V block Nonspecific T wave abnormality When compared with ECG of 15-Dec-2022 14:43, No significant change was found Confirmed by Bryan Lemma ([id number]) on 02/24/2023 2:20:45 PM    Echo 01/04/2013: Moderate LVH with EF 60 to 65%.  No RWMA.  GR 1 DD.  Normal LA size.  Myoview January 2020: NORMAL LOW RISK.  No ischemia or infarction.  Hyperdynamic LVEF.  Echocardiogram 11/10/20 (Ordered for bilateral PE) : EF 55-60%, no wall motion abnormalities, grade I DD with severe LVH, severely reduced RV function, moderately elevated pulmonary artery systolic pressure (PAP estimated at 57 mmHg.  -> McConnell sign noted along with IVS flattening in systole and diastole with RV pressure volume overload.  RAP estimated 8 mmHg. Echocardiogram 02/17/21: EF 65-70%, no regional wall motio abnormalities, grade I DD, normal RV function.  McConnell sign and IVS flattening no longer present.  (Suggesting PE affect on previous echo)  Labs  05/04/2022: TC 136, HDL 60, LDL 68, TG 133. 10/28/2022: A1c 8.2 12/29/2022: Cr 1.48, K+ 4.4. 01/11/2023: Hgb 12.3  Risk Assessment/Calculations:     HYPERTENSION  CONTROL Vitals:   02/24/23 1405 02/24/23 1446  BP: (!) 155/77 (!) 150/76    The patient's blood pressure is elevated above target today.  In order to address the patient's elevated BP: A current anti-hypertensive medication was adjusted today. (increase amlodipine to 10 mg - f/u wiht KJ, PA  on Jan 31)        Physical Exam:   VS:  BP (!) 150/76   Pulse 66   Ht 5\' 1"  (1.549 m)   Wt 260 lb (117.9 kg)   SpO2 95%   BMI 49.13 kg/m    Wt Readings from Last 3 Encounters:  02/24/23 260 lb (117.9 kg)  01/11/23 260 lb 3 oz (118 kg)  12/27/22 257 lb (116.6 kg)    GEN: Morbidly obese.  Well-groomed.  In no acute distress; otherwise healthy-appearing. NECK: No JVD; No carotid bruits CARDIAC:  RRR-with ectopy.  Distant, but normal S1, S2; no murmurs, rubs, gallops (difficult to assess due to body  habitus) RESPIRATORY:  Clear to auscultation without rales, wheezing or rhonchi ; nonlabored, good air movement. ABDOMEN: Soft, non-tender, non-distended-difficult to assess for bowel sounds or HSM. EXTREMITIES: Trivial ankle edema; No deformity     ASSESSMENT AND PLAN: .    Problem List Items Addressed This Visit       Cardiology Problems   CAD- s/p DES PCI x2 in RCA; Moderate LCx & LAD Dz - Primary (Chronic)   Just over 10 years post MI with 2 stents in the RCA with moderate disease in the LAD and LCx evaluated with a Myoview shortly thereafter which showed no ischemia. Myoview in 2020 - Non-ischemic /LOW RISK.  No evidence of infarction. Last echocardiogram in January 2023 showed high normal ejection fraction (60-65%) and grade 1 diastolic dysfunction. Not on aspirin or Plavix because of management with Eliquis On Carvedilol 25 mg twice daily, candesartan 30 mg daily, amlodipine now being increased to 10 mg daily along with rosuvastatin 10 mg Also on metformin and Mounjaro (Recently started)  -Continue current management - With the adjustment of increasing amlodipine to 10 mg       Relevant Medications   amLODipine (NORVASC) 10 MG tablet   Other Relevant Orders   EKG 12-Lead (Completed)   ECHOCARDIOGRAM COMPLETE   Essential hypertension (Chronic)   Hypertension Blood pressure control suboptimal, contributing to diastolic dysfunction. -Increase Amlodipine dose to 10 mg. - Check Echocardiogram to reassess Diastolic Fxn & PA pressures      Relevant Medications   amLODipine (NORVASC) 10 MG tablet   Other Relevant Orders   EKG 12-Lead (Completed)   ECHOCARDIOGRAM COMPLETE   Hyperlipidemia associated with type 2 diabetes mellitus (HCC) (Chronic)   Hyperlipidemia with most recent LDL as of March 2024 controlled at 68.  However, with recent weight gain after Lap-Band adjustment, anticipate that her lipids may not be as well-controlled On Rosuvastatin. -Order lipid panel as last checked in March 2024. - low threshold to either titrate statin dose up, or add Nexlizet for additional control.  Diabetes -> latest A1c was 8.2 Has been on metformin 500 mg-2 in AM and 1 in PM along Prandin => would strongly consider converting from Prandin to SGLT2 inhibitor ([place] or [place] Recently added Mounjaro for combination of DM and weight loss -I suspect she may require more aggressive treatment potentially with insulin.      Relevant Medications   amLODipine (NORVASC) 10 MG tablet   PE (pulmonary thromboembolism) (HCC) (Chronic)   On Eliquis for history of DVT/PE.  Initially had evidence of RV pressure volume overload symptoms resolved by following here, however now having concerning symptoms of exertional dyspnea, fatigue and edema. -Continue Eliquis as prescribed.-Managed by Heme-Onc (is on lower dose) -Order Echocardiogram to rule out pulmonary hypertension       Relevant Medications   amLODipine (NORVASC) 10 MG tablet   ST segment elevation myocardial infarction (STEMI) of inferolateral wall, subsequent episode of care [facility]) (Chronic)   Over 10 years post MI with two  stents in the right coronary artery.  Myoview in 2020 - Non-ischemic /LOW RISK.  No evidence of infarction. Echocardiograms that showed normal EF with no RWMA.  On stable regimen. -With no complaints of exertional dyspnea, orthopnea and edema, checking Echocardiogram      Relevant Medications   amLODipine (NORVASC) 10 MG tablet     Other   10 cm Lapband July 2006 (Chronic)   Ankle edema, bilateral (Chronic)   Reports of swelling in legs and abdomen, particularly  in the right foot. Shortness of breath on exertion but not at rest. No chest pain or pressure. No palpitations. -Encourage regular use of Torsemide, at least twice weekly and when weight increases by more than 3 pounds. -Order echocardiogram to rule out pulmonary hypertension & reassess diastolic function      Relevant Orders   ECHOCARDIOGRAM COMPLETE   Current use of long term anticoagulation (Chronic)   Managed with low-dose Eliquis for bilateral PEs.  This is complicated obviously about her anemia.  Should be okay to hold for procedures or surgeries, but will defer to Heme-Onc.      Iron deficiency anemia (Chronic)   History of anemia. - Managed by Hematology-Oncology -Continue to follow hemoglobin levels.      Severe obesity (BMI >= 40) (HCC) (Chronic)   Unfortunately, her weight goes up and down depending on Lap-Band adjustment.  Most recently gained a lot of weight. Recently started on GLP-1 agonist.  Again discussed importance of increasing her physical activity.        Follow-Ups:  Keep appt for 1/04/08/23 with Adin Hector PA (after Echocardiogram) reassess BP control & DOE (if Echo is normal - low threshold for [employer]) Return in 8 days (on 03/04/2023) for Follow-up with APP, 6 month follow-up with me.  Total time spent: 26 min spent with patient + 17 min spent charting = 43 min I spent 43 minutes in the care of CHRISSETTE BOOKWALTER today including reviewing outside labs from PCP via [facility] (2 minutes),  reviewing studies (7 minutes), face to face time discussing treatment options (26 minutes), reviewing records from previous clinic notes including PA note (3 minutes), 5 additional minutes dictating, and documenting in the encounter.     Signed, Marykay Lex, MD, MS Bryan Lemma, M.D., M.S. Interventional Cardiologist  [facility]  Pager # [phone] Phone # [phone] [address] [place], [place] [address]

## 2023-02-24 NOTE — Patient Instructions (Signed)
Medication Instructions:   Increase to Amlodipine 10 mg  daily   *If you need a refill on your cardiac medications before your next appointment, please call your pharmacy*   Lab Work:  If you have labs (blood work) drawn today and your tests are completely normal, you will receive your results only by: MyChart Message (if you have MyChart) OR A paper copy in the mail If you have any lab test that is abnormal or we need to change your treatment, we will call you to review the results.   Testing/Procedures: Will be schedule at [facility] [address] Your physician has requested that you have an echocardiogram. Echocardiography is a painless test that uses sound waves to create images of your heart. It provides your doctor with information about the size and shape of your heart and how well your heart's chambers and valves are working. This procedure takes approximately one hour. There are no restrictions for this procedure. Please do NOT wear cologne, perfume, aftershave, or lotions (deodorant is allowed). Please arrive 15 minutes prior to your appointment time.  Please note: We ask at that you not bring children with you during ultrasound (echo/ vascular) testing. Due to room size and safety concerns, children are not allowed in the ultrasound rooms during exams. Our front office staff cannot provide observation of children in our lobby area while testing is being conducted. An adult accompanying a patient to their appointment will only be allowed in the ultrasound room at the discretion of the ultrasound technician under special circumstances. We apologize for any inconvenience.   Follow-Up: At [facility], you and your health needs are our priority.  As part of our continuing mission to provide you with exceptional heart care, we have created designated Provider Care Teams.  These Care Teams include your primary Cardiologist (physician) and Advanced Practice Providers (APPs -   Physician Assistants and Nurse Practitioners) who all work together to provide you with the care you need, when you need it.     Your next appointment:   5 month(s)  The format for your next appointment:   In Person  Provider:   Bryan Lemma, MD  Keep appt for 1/04/08/23 with Adin Hector PA    Other Instructions

## 2023-02-27 ENCOUNTER — Other Ambulatory Visit (HOSPITAL_COMMUNITY): Payer: Medicare Other

## 2023-02-27 ENCOUNTER — Encounter: Payer: Self-pay | Admitting: Cardiology

## 2023-02-27 NOTE — Assessment & Plan Note (Signed)
Hypertension Blood pressure control suboptimal, contributing to diastolic dysfunction. -Increase Amlodipine dose to 10 mg. - Check Echocardiogram to reassess Diastolic Fxn & PA pressures

## 2023-02-27 NOTE — Assessment & Plan Note (Addendum)
Over 10 years post MI with two stents in the right coronary artery.  Myoview in 2020 - Non-ischemic /LOW RISK.  No evidence of infarction. Echocardiograms that showed normal EF with no RWMA.  On stable regimen. -With no complaints of exertional dyspnea, orthopnea and edema, checking Echocardiogram

## 2023-02-27 NOTE — Assessment & Plan Note (Signed)
Just over 10 years post MI with 2 stents in the RCA with moderate disease in the LAD and LCx evaluated with a Myoview shortly thereafter which showed no ischemia. Myoview in 2020 - Non-ischemic /LOW RISK.  No evidence of infarction. Last echocardiogram in January 2023 showed high normal ejection fraction (60-65%) and grade 1 diastolic dysfunction. Not on aspirin or Plavix because of management with Eliquis On Carvedilol 25 mg twice daily, candesartan 30 mg daily, amlodipine now being increased to 10 mg daily along with rosuvastatin 10 mg Also on metformin and Mounjaro (Recently started)  -Continue current management - With the adjustment of increasing amlodipine to 10 mg

## 2023-02-27 NOTE — Assessment & Plan Note (Addendum)
Hyperlipidemia with most recent LDL as of March 2024 controlled at 68.  However, with recent weight gain after Lap-Band adjustment, anticipate that her lipids may not be as well-controlled On Rosuvastatin. -Order lipid panel as last checked in March 2024. - low threshold to either titrate statin dose up, or add Nexlizet for additional control.  Diabetes -> latest A1c was 8.2 Has been on metformin 500 mg-2 in AM and 1 in PM along Prandin => would strongly consider converting from Prandin to SGLT2 inhibitor ([place]ardiance or [place] Recently added Mounjaro for combination of DM and weight loss -I suspect she may require more aggressive treatment potentially with insulin.

## 2023-02-27 NOTE — Assessment & Plan Note (Signed)
History of anemia. - Managed by Hematology-Oncology -Continue to follow hemoglobin levels.

## 2023-02-27 NOTE — Assessment & Plan Note (Signed)
Reports of swelling in legs and abdomen, particularly in the right foot. Shortness of breath on exertion but not at rest. No chest pain or pressure. No palpitations. -Encourage regular use of Torsemide, at least twice weekly and when weight increases by more than 3 pounds. -Order echocardiogram to rule out pulmonary hypertension & reassess diastolic function

## 2023-02-27 NOTE — Assessment & Plan Note (Addendum)
On Eliquis for history of DVT/PE.  Initially had evidence of RV pressure volume overload symptoms resolved by following here, however now having concerning symptoms of exertional dyspnea, fatigue and edema. -Continue Eliquis as prescribed.-Managed by Heme-Onc (is on lower dose) -Order Echocardiogram to rule out pulmonary hypertension

## 2023-02-27 NOTE — Assessment & Plan Note (Signed)
Unfortunately, her weight goes up and down depending on Lap-Band adjustment.  Most recently gained a lot of weight. Recently started on GLP-1 agonist.  Again discussed importance of increasing her physical activity.

## 2023-02-27 NOTE — Assessment & Plan Note (Signed)
Managed with low-dose Eliquis for bilateral PEs.  This is complicated obviously about her anemia.  Should be okay to hold for procedures or surgeries, but will defer to Heme-Onc.

## 2023-02-28 ENCOUNTER — Ambulatory Visit (HOSPITAL_COMMUNITY): Payer: Medicare Other | Attending: Cardiology

## 2023-02-28 DIAGNOSIS — M25472 Effusion, left ankle: Secondary | ICD-10-CM | POA: Diagnosis present

## 2023-02-28 DIAGNOSIS — I25118 Atherosclerotic heart disease of native coronary artery with other forms of angina pectoris: Secondary | ICD-10-CM | POA: Diagnosis present

## 2023-02-28 DIAGNOSIS — M25471 Effusion, right ankle: Secondary | ICD-10-CM | POA: Diagnosis present

## 2023-02-28 DIAGNOSIS — I10 Essential (primary) hypertension: Secondary | ICD-10-CM | POA: Insufficient documentation

## 2023-02-28 LAB — ECHOCARDIOGRAM COMPLETE
Area-P 1/2: 2.56 cm2
S' Lateral: 2.6 cm

## 2023-03-01 ENCOUNTER — Encounter: Payer: Self-pay | Admitting: Cardiology

## 2023-03-05 NOTE — Progress Notes (Signed)
Cardiology Office Note:  .   Date:  03/10/2023  ID:  Sandra Beasley, DOB March 19, 1950, MRN [id number] PCP: Daisy Floro, MD  [facility] Providers Cardiologist:  Bryan Lemma, MD   History of Present Illness: Sandra Beasley is a 73 y.o. female with a past medical history of CAD, HTN, history of PE, type 2 DM, HLD, iron deficiency anemia. Patient is followed by Dr. Herbie Baltimore and presents today for evaluation of shortness of breath and lower extremity edema.   Patient previously had a STEMI in 2014 and received 2 overlapping DES to distal RCA-PDA. There was also disease in OM1 and LAD that were medically managed. Underwent nuclear stress test in 02/2018 that was a normal, low risk study without evidence of ischemia. Patient was later admitted in 11/2020 with bilateral PE and bilateral LE DVTs. She was started on eliquis. Echocardiogram 11/10/20 showed EF 55-60%, no wall motion abnormalities, grade I DD with severe LVH, severely reduced RV function, moderately elevated pulmonary artery systolic pressure.    Echocardiogram 02/17/21 showed EF 65-70%, no regional wall motio abnormalities, grade I DD, normal RV function.    I saw patient in clinic on 12/15/22 for evaluation of ankle swelling. She did not want to take a daily diuretic due to frequent urination. Started torsemide 20 mg PRN for ankle swelling. She was later seen by Dr. Herbie Baltimore on 02/24/23. At that time, BP was a bit elevated and her amlodipine was increased to 10 mg daily. Remained on carvedilol 25 mg BID, candesartan 30 mg daily, crestor 10 mg daily. Underwent echocardiogram on 02/28/23 that showed EF 65-70%, no regional wall motion abnormalities, mild LVH, grade I DD, hyperdynamic RV systolic function.   Today, patient reports ongoing shortness of breath, which she describes as not improving and easily triggered by exertion. Initially has worsening shortness of breath when she had PE, but symptoms improved when PE was treated. Gain  developed shortness of breath after a rapid weight gain of thirty pounds following the loosening of a gastric band. The patient is currently on Mounjaro for weight loss but reports no significant improvement yet. The patient also reports occasional nausea and a sensation of hunger pain.  In addition to the primary concern of shortness of breath, the patient also reports back pain, described as a muscle pain. The pain was noted to be sensitive during a massage, particularly around the left shoulder. She denies any chest pain. Denies having any symptoms that resemble the symptoms experienced during a previous heart attack, which was primarily characterized by profuse sweating.  The patient is also on torsemide for fluid retention, taken twice a week as directed by another provider. The patient reports significant diuresis, requiring her to stay close to a bathroom for about four to five hours. The patient notes an initial significant weight loss of eleven pounds overnight after the first dose, with subsequent doses resulting in less dramatic changes.  ROS: Denies chest pain, palpitations, ankle swelling, syncope, near syncope, dizziness, Does have shortness of breath   Studies Reviewed: .   Cardiac Studies & Procedures     STRESS TESTS  MYOCARDIAL PERFUSION IMAGING 03/01/2018  Narrative  The left ventricular ejection fraction is hyperdynamic (>65%).  Nuclear stress EF: 74%.  There was no ST segment deviation noted during stress.  No T wave inversion was noted during stress.  The study is normal.  This is a low risk study.  ECHOCARDIOGRAM  ECHOCARDIOGRAM COMPLETE 02/28/2023  Narrative ECHOCARDIOGRAM  REPORT    Patient Name:   Sandra Beasley Date of Exam: 02/28/2023 Medical Rec #:  [id number]       Height:       61.0 in Accession #:    [id number]      Weight:       260.0 lb Date of Birth:  03-25-1950        BSA:          2.112 m Patient Age:    72 years        BP:           150/76  mmHg Patient Gender: F               HR:           66 bpm. Exam Location:  [address]  Procedure: 2D Echo, 3D Echo, Cardiac Doppler and Color Doppler  Indications:    R06.00 Dyspnea  History:        Patient has prior history of Echocardiogram examinations, most recent 02/17/2021. CAD and Previous Myocardial Infarction, Signs/Symptoms:Chest Pain, Shortness of Breath, Dyspnea, Fatigue and Edema; Risk Factors:Hypertension, Diabetes and Dyslipidemia. Morbid Obesity, History of DVT with Bilateral Pulmonary Embolism (11-2020).  Sonographer:    Farrel Conners RDCS Referring Phys: Piedad Climes HARDING  IMPRESSIONS   1. Left ventricular ejection fraction, by estimation, is 65 to 70%. Left ventricular ejection fraction by 3D volume is 65 %. The left ventricle has normal function. The left ventricle has no regional wall motion abnormalities. There is mild concentric left ventricular hypertrophy. Left ventricular diastolic parameters are consistent with Grade I diastolic dysfunction (impaired relaxation). 2. Right ventricular systolic function is hyperdynamic. The right ventricular size is normal. Mildly increased right ventricular wall thickness. Tricuspid regurgitation signal is inadequate for assessing PA pressure. 3. The mitral valve was not well visualized. No evidence of mitral valve regurgitation. 4. The aortic valve is tricuspid. There is mild calcification of the aortic valve. Aortic valve regurgitation is not visualized. No aortic stenosis is present. 5. The inferior vena cava is normal in size with greater than 50% respiratory variability, suggesting right atrial pressure of 3 mmHg.  Comparison(s): No significant change from prior study. Prior images reviewed side by side. Aorta is within normal dimensions for age and body surface area.  FINDINGS Left Ventricle: Left ventricular ejection fraction, by estimation, is 65 to 70%. Left ventricular ejection fraction by 3D volume is 65 %. The  left ventricle has normal function. The left ventricle has no regional wall motion abnormalities. The left ventricular internal cavity size was normal in size. There is mild concentric left ventricular hypertrophy. Left ventricular diastolic parameters are consistent with Grade I diastolic dysfunction (impaired relaxation).  Right Ventricle: The right ventricular size is normal. Mildly increased right ventricular wall thickness. Right ventricular systolic function is hyperdynamic. Tricuspid regurgitation signal is inadequate for assessing PA pressure.  Left Atrium: Left atrial size was normal in size.  Right Atrium: Right atrial size was normal in size.  Pericardium: There is no evidence of pericardial effusion.  Mitral Valve: The mitral valve was not well visualized. No evidence of mitral valve regurgitation.  Tricuspid Valve: The tricuspid valve is normal in structure. Tricuspid valve regurgitation is not demonstrated. No evidence of tricuspid stenosis.  Aortic Valve: The aortic valve is tricuspid. There is mild calcification of the aortic valve. Aortic valve regurgitation is not visualized. No aortic stenosis is present.  Pulmonic Valve: The pulmonic valve was normal in structure. Pulmonic valve  regurgitation is not visualized. No evidence of pulmonic stenosis.  Aorta: The aortic root and ascending aorta are structurally normal, with no evidence of dilitation.  Venous: The inferior vena cava is normal in size with greater than 50% respiratory variability, suggesting right atrial pressure of 3 mmHg.  IAS/Shunts: The atrial septum is grossly normal.   LEFT VENTRICLE PLAX 2D LVIDd:         4.60 cm         Diastology LVIDs:         2.60 cm         LV e' medial:    6.52 cm/s LV PW:         1.10 cm         LV E/e' medial:  10.9 LV IVS:        1.10 cm         LV e' lateral:   7.77 cm/s LVOT diam:     2.40 cm         LV E/e' lateral: 9.2 LV SV:         107 LV SV Index:   51 LVOT Area:      4.52 cm        3D Volume EF LV 3D EF:    Left ventricul ar ejection fraction by 3D volume is 65 %.  3D Volume EF: 3D EF:        65 % LV EDV:       124 ml LV ESV:       44 ml LV SV:        80 ml  RIGHT VENTRICLE RV Basal diam:  2.80 cm RV S prime:     23.50 cm/s TAPSE (M-mode): 2.4 cm  LEFT ATRIUM             Index        RIGHT ATRIUM           Index LA diam:        3.10 cm 1.47 cm/m   RA Pressure: 3.00 mmHg LA Vol (A2C):   43.5 ml 20.60 ml/m  RA Area:     16.60 cm LA Vol (A4C):   60.2 ml 28.51 ml/m  RA Volume:   39.90 ml  18.90 ml/m LA Biplane Vol: 51.7 ml 24.48 ml/m AORTIC VALVE LVOT Vmax:   107.00 cm/s LVOT Vmean:  70.850 cm/s LVOT VTI:    0.237 m  AORTA Ao Root diam: 3.60 cm Ao Asc diam:  3.40 cm  MITRAL VALVE                TRICUSPID VALVE MV Area (PHT): cm          Estimated RAP:  3.00 mmHg MV Decel Time: 296 msec MV E velocity: 71.30 cm/s   SHUNTS MV A velocity: 106.00 cm/s  Systemic VTI:  0.24 m MV E/A ratio:  0.67         Systemic Diam: 2.40 cm  Riley Lam MD Electronically signed by Riley Lam MD Signature Date/Time: 02/28/2023/3:16:02 PM    Final             Risk Assessment/Calculations:             Physical Exam:   VS:  BP 130/72 (BP Location: Left Wrist, Patient Position: Sitting, Cuff Size: Large)   Pulse 75   Ht 5\' 1"  (1.549 m)   Wt 261 lb 6.4 oz (118.6 kg)   SpO2 97%  BMI 49.39 kg/m    Wt Readings from Last 3 Encounters:  03/10/23 261 lb 6.4 oz (118.6 kg)  02/24/23 260 lb (117.9 kg)  01/11/23 260 lb 3 oz (118 kg)    GEN: Well nourished, well developed in no acute distress. Sitting upright in the chair  NECK: No JVD CARDIAC: RRR, no murmurs, rubs, gallops. Radial pulses 2+ bilaterally  RESPIRATORY:  Clear to auscultation without rales, wheezing or rhonchi. Normal WOB on room air   ABDOMEN: Soft, non-tender, non-distended EXTREMITIES:  No edema in BLE; No deformity   ASSESSMENT AND PLAN: .     Lower Extremity Edema  Shortness of breath  - Patient was seen in 12/2022 for shortness of breath and ankle swelling. Was started on torsemide 20 mg PRN. Did not want to start daily diuretics due to concerns about increased urination  - when seen by Dr. Herbie Baltimore in 02/2023, she continued to have symptoms but was only taking torsemide about 1 time per week.  - Echocardiogram 02/2023 showed EF 65-70%, no regional wall motion abnormalities mild LVH, grade I DD, hyperdynamic RV function. Overall, study was reassuring  - She has been taking torsemide twice weekly. Notes resolution of lower extremity swelling, but continues to be short of breath  - Recommended stress test. However, patient reports that she would rather try to increase her physical activity and see if her symptoms improve. She is planning to join silver sneakers, and wants to see if exercise and weight loss will help her breathe better. She denies any chest pain or symptoms similar to her MI in the past. I agree that weight loss and increased physical activity are a reasonable plan. If she develops chest pain or worsening SOB on exertion, she knows to contact the office   CAD  - Previously had inferior STEMI in 2014 and received 2 overlapping DES to the RCA. Had residual disease in the OM1 and LAD that is managed medically  - Patient denies chest pain. No symptoms similar to her heart attack in the past  - As above, discussed stress test with ongoing DOE. Patient prefers to work on weight loss first to see if symptoms improve  - Not on ASA as she is is on eliquis  - Continue amlodipine 10 mg daily, carvedilol 25 mg BID, crestor 10 mg daily      HLD  - Lipid panel from 04/2022 showed LDL 68  - Continue crestor 10 mg daily    History of DVT, PE  - Occurred in 2022 after a flight to [place] - Continue eliquis - patient reports excellent compliance and has not missed any doses    HTN  -  Current BP regiment includes amlodipine 10 mg  dialy, candesartan 32 m daily, carvedilol 25 mg BID  - Creatinine 1.48 and K 4.4 in 12/2022  - BP well controlled. Continue current regiment  - Increase physical activity as tolerated    Obesity  - Patient recently had her lap band loosened and gained 30+ lbs in one month  - Now on mounjaro- so far, she has been tolerating it well. Has not had weight loss or decreased appetite yet  - Discussed mediterranean diet and increasing activity as tolerated   Dispo: Follow up with Dr. Herbie Baltimore in 07/2023 as scheduled   Signed, Jonita Albee, PA-C

## 2023-03-07 ENCOUNTER — Encounter: Payer: Self-pay | Admitting: *Deleted

## 2023-03-10 ENCOUNTER — Ambulatory Visit: Payer: Medicare Other | Attending: Cardiology | Admitting: Cardiology

## 2023-03-10 ENCOUNTER — Encounter: Payer: Self-pay | Admitting: Cardiology

## 2023-03-10 VITALS — BP 130/72 | HR 75 | Ht 61.0 in | Wt 261.4 lb

## 2023-03-10 DIAGNOSIS — M25471 Effusion, right ankle: Secondary | ICD-10-CM

## 2023-03-10 DIAGNOSIS — I10 Essential (primary) hypertension: Secondary | ICD-10-CM

## 2023-03-10 DIAGNOSIS — M25472 Effusion, left ankle: Secondary | ICD-10-CM

## 2023-03-10 DIAGNOSIS — E1169 Type 2 diabetes mellitus with other specified complication: Secondary | ICD-10-CM | POA: Diagnosis not present

## 2023-03-10 DIAGNOSIS — Z9861 Coronary angioplasty status: Secondary | ICD-10-CM

## 2023-03-10 DIAGNOSIS — E785 Hyperlipidemia, unspecified: Secondary | ICD-10-CM

## 2023-03-10 DIAGNOSIS — R0609 Other forms of dyspnea: Secondary | ICD-10-CM

## 2023-03-10 DIAGNOSIS — I2510 Atherosclerotic heart disease of native coronary artery without angina pectoris: Secondary | ICD-10-CM | POA: Diagnosis not present

## 2023-03-10 DIAGNOSIS — I2699 Other pulmonary embolism without acute cor pulmonale: Secondary | ICD-10-CM

## 2023-03-10 NOTE — Patient Instructions (Addendum)
Medication Instructions:  Your physician recommends that you continue on your current medications as directed. Please refer to the Current Medication list given to you today.  *If you need a refill on your cardiac medications before your next appointment, please call your pharmacy*  Lab Work: No labs  Testing/Procedures: No testing  Follow-Up: At [facility], you and your health needs are our priority.  As part of our continuing mission to provide you with exceptional heart care, we have created designated Provider Care Teams.  These Care Teams include your primary Cardiologist (physician) and Advanced Practice Providers (APPs -  Physician Assistants and Nurse Practitioners) who all work together to provide you with the care you need, when you need it.  We recommend signing up for the patient portal called "MyChart".  Sign up information is provided on this After Visit Summary.  MyChart is used to connect with patients for Virtual Visits (Telemedicine).  Patients are able to view lab/test results, encounter notes, upcoming appointments, etc.  Non-urgent messages can be sent to your provider as well.   To learn more about what you can do with MyChart, go to [email or web address].    Your next appointment:   Already scheduled  Other Instructions

## 2023-04-25 ENCOUNTER — Other Ambulatory Visit: Payer: Self-pay | Admitting: Physician Assistant

## 2023-04-25 DIAGNOSIS — I2699 Other pulmonary embolism without acute cor pulmonale: Secondary | ICD-10-CM

## 2023-04-25 DIAGNOSIS — D508 Other iron deficiency anemias: Secondary | ICD-10-CM

## 2023-04-25 DIAGNOSIS — E538 Deficiency of other specified B group vitamins: Secondary | ICD-10-CM

## 2023-04-26 ENCOUNTER — Inpatient Hospital Stay: Payer: Medicare HMO | Attending: Physician Assistant

## 2023-04-26 ENCOUNTER — Inpatient Hospital Stay: Payer: Medicare HMO | Admitting: Physician Assistant

## 2023-04-26 VITALS — BP 131/73 | HR 108 | Temp 97.7°F | Resp 18 | Ht 61.0 in | Wt 254.3 lb

## 2023-04-26 DIAGNOSIS — E611 Iron deficiency: Secondary | ICD-10-CM | POA: Diagnosis not present

## 2023-04-26 DIAGNOSIS — Z7901 Long term (current) use of anticoagulants: Secondary | ICD-10-CM | POA: Insufficient documentation

## 2023-04-26 DIAGNOSIS — E538 Deficiency of other specified B group vitamins: Secondary | ICD-10-CM

## 2023-04-26 DIAGNOSIS — Z86718 Personal history of other venous thrombosis and embolism: Secondary | ICD-10-CM | POA: Insufficient documentation

## 2023-04-26 DIAGNOSIS — Z86711 Personal history of pulmonary embolism: Secondary | ICD-10-CM | POA: Diagnosis present

## 2023-04-26 DIAGNOSIS — D508 Other iron deficiency anemias: Secondary | ICD-10-CM | POA: Diagnosis not present

## 2023-04-26 DIAGNOSIS — Z803 Family history of malignant neoplasm of breast: Secondary | ICD-10-CM | POA: Insufficient documentation

## 2023-04-26 DIAGNOSIS — I2699 Other pulmonary embolism without acute cor pulmonale: Secondary | ICD-10-CM

## 2023-04-26 LAB — CMP (CANCER CENTER ONLY)
ALT: 9 U/L (ref 0–44)
AST: 10 U/L — ABNORMAL LOW (ref 15–41)
Albumin: 3.9 g/dL (ref 3.5–5.0)
Alkaline Phosphatase: 58 U/L (ref 38–126)
Anion gap: 7 (ref 5–15)
BUN: 23 mg/dL (ref 8–23)
CO2: 26 mmol/L (ref 22–32)
Calcium: 9.2 mg/dL (ref 8.9–10.3)
Chloride: 107 mmol/L (ref 98–111)
Creatinine: 1.34 mg/dL — ABNORMAL HIGH (ref 0.44–1.00)
GFR, Estimated: 42 mL/min — ABNORMAL LOW (ref >60)
Glucose, Bld: 152 mg/dL — ABNORMAL HIGH (ref 70–99)
Potassium: 3.9 mmol/L (ref 3.5–5.1)
Sodium: 140 mmol/L (ref 135–145)
Total Bilirubin: 0.7 mg/dL (ref 0.0–1.2)
Total Protein: 6.8 g/dL (ref 6.5–8.1)

## 2023-04-26 LAB — CBC WITH DIFFERENTIAL (CANCER CENTER ONLY)
Abs Immature Granulocytes: 0.02 10*3/uL (ref 0.00–0.07)
Basophils Absolute: 0.1 10*3/uL (ref 0.0–0.1)
Basophils Relative: 1 %
Eosinophils Absolute: 0.3 10*3/uL (ref 0.0–0.5)
Eosinophils Relative: 5 %
HCT: 38.6 % (ref 36.0–46.0)
Hemoglobin: 12.2 g/dL (ref 12.0–15.0)
Immature Granulocytes: 0 %
Lymphocytes Relative: 22 %
Lymphs Abs: 1.4 10*3/uL (ref 0.7–4.0)
MCH: 27.2 pg (ref 26.0–34.0)
MCHC: 31.6 g/dL (ref 30.0–36.0)
MCV: 86 fL (ref 80.0–100.0)
Monocytes Absolute: 0.4 10*3/uL (ref 0.1–1.0)
Monocytes Relative: 7 %
Neutro Abs: 4.1 10*3/uL (ref 1.7–7.7)
Neutrophils Relative %: 65 %
Platelet Count: 227 10*3/uL (ref 150–400)
RBC: 4.49 MIL/uL (ref 3.87–5.11)
RDW: 14.3 % (ref 11.5–15.5)
WBC Count: 6.3 10*3/uL (ref 4.0–10.5)
nRBC: 0 % (ref 0.0–0.2)

## 2023-04-26 LAB — FERRITIN: Ferritin: 24 ng/mL (ref 11–307)

## 2023-04-26 LAB — IRON AND IRON BINDING CAPACITY (CC-WL,HP ONLY)
Iron: 59 ug/dL (ref 28–170)
Saturation Ratios: 18 % (ref 10.4–31.8)
TIBC: 323 ug/dL (ref 250–450)
UIBC: 264 ug/dL (ref 148–442)

## 2023-04-26 LAB — VITAMIN B12: Vitamin B-12: 778 pg/mL (ref 180–914)

## 2023-04-26 LAB — FOLATE: Folate: 6.6 ng/mL (ref >5.9)

## 2023-04-26 NOTE — Progress Notes (Unsigned)
 [facility] [phone]   [phone]  PROGRESS NOTE  Patient Care Team: Daisy Floro, MD as PCP - General (Family Medicine) Marykay Lex, MD as PCP - Cardiology (Cardiology)  Hematological/Oncological History -11/09/2020-11/13/2020: Admitted due to progressive dyspnea.  CT angiogram of the chest showed extensive bilateral pulmonary emboli with associated right heart strain.  Doppler ultrasound revealed acute DVT involving the left posterior tibial veins, left peroneal veins and right posterior tibial veins.  Patient was started on IV heparin drip and transition to Eliquis.    -05/17/2021: Establish care with Sandra Kaufmann PA-C  -09/22/2021-10/20/2021: Received IV venofer 200 mg once a week x 5 doses  -12/15/2021: Received IV venofer 200 mg x 1 dose  -01/28/2022-02/09/2022: Received IV venofer 200 mg x 2 doses.   CHIEF COMPLAINTS/PURPOSE OF CONSULTATION:  -H/O bilateral DVT and pulmonary emboli -Iron deficiency anemia/folate deficiency/vitamin B12 deficiency  HISTORY OF PRESENTING ILLNESS:  Sandra Beasley 73 y.o. female returns for a follow up for iron, B12 and folate deficiency. She is unaccompanied for this visit.  He was last seen on 07/22/2022.  In the interim she has had no major changes in her health.  On exam today,Sandra Beasley reports she has been having some increasing shortness of breath with exercise.  She reports that she is not feeling lethargic but only when she exerts herself becomes very short of breath.  She reports that she can only walk about 50 yards.  She reports that it is her breathing that gives out and limits her.  She notes that she is not currently in any pain.  She was prescribed torsemide by her cardiologist PA and notes that it did help with her lower extremity swelling and some with her shortness of breath.  She reports that she is only had it for a few days.  She reports that she has not recently had an echocardiogram and  her last one on file is from January 2023.  She reports that she will talk to her cardiologist about this.  She notes that her Eliquis 2.5 mg twice daily is not causing any trouble.  She is taking it faithfully with no bleeding, bruising, or dark stools.  She reports the cost is $145 per month, but is aware that the price may drop next year with Medicare.  She reports that they did loosen the gastric band in her stomach and that has been causing considerable weight gain.  She was started on Mounjaro in order to help with weight loss.  Otherwise she does not have any questions concerns or complaints today.. She denies fevers, chills, sweats, chest pain or cough. She has no other complaints. Rest of the 10 point ROS is below  MEDICAL HISTORY:  Past Medical History:  Diagnosis Date   Adenomatous polyp of colon 09/2005   Bilateral pulmonary embolism (HCC) 11/09/2020   PE protocol CTA 11/09/2020: Extensive bilateral pulmonary artery thrombus within numerous bilateral upper lobe, right middle lobe and bilateral lower lobe branches. No saddle PE. Mild cardiomegaly with right-sided heart strain.  McKeown signs seen on TTE with severely reduced RV function, IVS flattening in S&D (RV P & Vol Overload) - PAP ~ 57 mmHg. IAS Bowel to L - high RAP. + McConnell Sign   CAD S/P percutaneous coronary angioplasty - DES x2 in RCA 01/02/2013; 02/2013   a. 100% RPDA - Xience Xpedition DES 2.25 mm x 15 mm + 2.25 mm x 8 mm overlapping, mod- severe distal LAD and  prox OM1 lesions.;b.  Myoview 02/2013 & Jan 2020: No ischemia or Infarction, Normal EF.   DVT of lower extremity, bilateral (HCC) 11/2020   Presented with bilateral PEs.  Noted to have bilateral popliteal vein DVTs-follow on a trip to and from [place].   GERD (gastroesophageal reflux disease) 03/06/2013   Hiatal hernia without gangrene and obstruction 11/2020   CTA of the chest showed: Moderate size hiatal hernia with evidence of prior Lap-Band. Also noted cholelithiasis    Hyperlipidemia associated with type 2 diabetes mellitus (HCC)    Hypertension    Obesity    STEMI (ST elevation myocardial infarction) (HCC) 01/02/2013   --> PCI; EF 60 and 65%. Gr 1 DD & No regional WMA, Otherwise relatively normal.   Type 2 diabetes mellitus with circulatory disorder (HCC)    Vitamin D deficiency     SURGICAL HISTORY: Past Surgical History:  Procedure Laterality Date   ABDOMINAL HYSTERECTOMY     CORONARY ANGIOPLASTY WITH STENT PLACEMENT  01/02/2013   PCI-RCA: Xience Xpedition 2.25 mm x 15 mm, 2.25 mm x 8 mm (2.4 mm)   LAPAROSCOPIC GASTRIC BANDING  2006   LAPAROSCOPIC HYSTERECTOMY  1996   LEFT HEART CATHETERIZATION WITH CORONARY ANGIOGRAM N/A 01/02/2013   Procedure: LEFT HEART CATHETERIZATION WITH CORONARY ANGIOGRAM;  Surgeon: Marykay Lex, MD;  Location: [facility] CATH LAB;: Inferior STEMI: 100% PDA; mid-distal LAD beyond D1 (small caliber) 80%.  Ostial D1 40%.  Lateral OM 1 with 70-80% stenosis.  Large branching ramus intermedius with OM and DIAG branch branches.   NM MYOVIEW LTD  01/'15; 1/'20   a) EF 60-65%.  Normal wall motion.  No ischemia or infarction.; b) EF 70 to 75%.  No EKG changes.  No evidence of ischemia or infarction.:   TONSILLECTOMY  1966   TRANSTHORACIC ECHOCARDIOGRAM  01/04/2013   (Post inferior STEMI) EF 60 and 65%. Grade 1 diastolic dysfunction with no regional wall motion abnormalities. Otherwise relatively normal.   TRANSTHORACIC ECHOCARDIOGRAM  11/10/2020   (In setting of PE) EF 55 to 60%.  No RWMA.  GR 1 DD.  IVS flattening in systole and diastole consistent with RV pressure volume overload.  Severely reduced RV function with mildly elevated PAP roughly 57 mmHg.  Normal atrial sizes, I-S bowing to the left suggesting high RAP.  McConnell sign also noted suggesting a pulmonary embolus.   TRANSTHORACIC ECHOCARDIOGRAM  02/23/2021   3 months post PE: EF 65 to 70%.  No RWMA.  GR 1 DD with moderate LA dilation.  Unable to assess PAP.  Normal aortic and  mitral valves.  Ascending Aorta measured at 37 mmHg. Comparison(s): 11/10/20 EF 55-60%. PA pressure    SOCIAL HISTORY: Social History   Socioeconomic History   Marital status: Divorced    Spouse name: Not on file   Number of children: 3   Years of education: Not on file   Highest education level: Not on file  Occupational History   Occupation: Teacher  Tobacco Use   Smoking status: Never   Smokeless tobacco: Never  Vaping Use   Vaping status: Never Used  Substance and Sexual Activity   Alcohol use: No   Drug use: No   Sexual activity: Not on file  Other Topics Concern   Not on file  Social History Narrative   She is a divorced nonsmoker who works for [employer]. She is currently now in cardiac rehabilitation. Has changed her dietary habits to a Vegan lifestyle.   Social Drivers of  Health   Financial Resource Strain: Not on file  Food Insecurity: Not on file  Transportation Needs: Not on file  Physical Activity: Not on file  Stress: Not on file  Social Connections: Not on file  Intimate Partner Violence: Not on file    FAMILY HISTORY: Family History  Adopted: Yes  Problem Relation Age of Onset   Breast cancer Maternal Aunt    Diabetes Maternal Aunt    Heart disease Maternal Aunt    Diabetes Maternal Uncle    Diabetes gravidarum Maternal Uncle    Colon cancer Neg Hx    Stomach cancer Neg Hx    Rectal cancer Neg Hx     ALLERGIES:  is allergic to ace inhibitors, actos [pioglitazone], other, and statins.  MEDICATIONS:  Current Outpatient Medications  Medication Sig Dispense Refill   amLODipine (NORVASC) 10 MG tablet Take 1 tablet (10 mg total) by mouth daily. 90 tablet 3   candesartan (ATACAND) 32 MG tablet Take 32 mg by mouth daily.     carvedilol (COREG) 25 MG tablet Take 25 mg by mouth 2 (two) times daily.     Cholecalciferol (VITAMIN D3) 25 MCG (1000 UT) capsule 1 capsule Orally Once a day     ELIQUIS 2.5 MG TABS tablet Take 2.5 mg by mouth 2  (two) times daily.     Lancets (ONETOUCH DELICA PLUS LANCET30G) MISC daily as needed.     metFORMIN (GLUCOPHAGE) 500 MG tablet Take 2 by mouth in am and 1 in pm     metroNIDAZOLE (METROCREAM) 0.75 % cream Apply 1 application topically 2 (two) times daily as needed (rosacea). Apply to face     MOUNJARO 7.5 MG/0.5ML Pen Inject 7.5 mg into the skin once a week.     nitroGLYCERIN (NITROSTAT) 0.4 MG SL tablet Place 1 tablet (0.4 mg total) under the tongue every 5 (five) minutes as needed for chest pain. 25 tablet 1   ONETOUCH VERIO test strip 1 each by Other route as needed.     potassium chloride (KLOR-CON) 10 MEQ tablet Take 10 meq once a day as needed with Torsemide. 90 tablet 3   repaglinide (PRANDIN) 0.5 MG tablet Take 0.5 mg by mouth 3 (three) times daily before meals.     rosuvastatin (CRESTOR) 10 MG tablet Take 10 mg by mouth daily.  4   torsemide (DEMADEX) 20 MG tablet Take 20 mg once a day as needed 90 tablet 3   triamcinolone cream (KENALOG) 0.1 % Apply 1 application  topically 2 (two) times daily as needed (rash/irritation).     MOUNJARO 2.5 MG/0.5ML Pen Inject 2.5 mg into the skin.     pantoprazole (PROTONIX) 40 MG tablet Take 1 tablet (40 mg total) by mouth daily. (Patient taking differently: Take 40 mg by mouth every other day.) 30 tablet 1   No current facility-administered medications for this visit.    REVIEW OF SYSTEMS:   Constitutional: ( - ) fevers, ( - )  chills , ( - ) night sweats Eyes: ( - ) blurriness of vision, ( - ) double vision, ( - ) watery eyes Ears, nose, mouth, throat, and face: ( - ) mucositis, ( - ) sore throat Respiratory: ( - ) cough, ( + ) dyspnea, ( - ) wheezes Cardiovascular: ( - ) palpitation, ( - ) chest discomfort, (+ ) lower extremity swelling Gastrointestinal:  ( - ) nausea, ( - ) heartburn, ( - ) change in bowel habits Skin: ( - ) abnormal  skin rashes Lymphatics: ( - ) new lymphadenopathy, ( - ) easy bruising Neurological: ( - ) numbness, ( - )  tingling, ( - ) new weaknesses Behavioral/Psych: ( - ) mood change, ( - ) new changes  All other systems were reviewed with the patient and are negative.  PHYSICAL EXAMINATION: ECOG PERFORMANCE STATUS: 1 - Symptomatic but completely ambulatory  Vitals:   04/26/23 1115  BP: 131/73  Pulse: (!) 108  Resp: 18  Temp: 97.7 F (36.5 C)  SpO2: 93%    Filed Weights   04/26/23 1115  Weight: 254 lb 4.8 oz (115.3 kg)     GENERAL: well appearing female in NAD SKIN: skin color, texture, turgor are normal, no rashes or significant lesions EYES: conjunctiva are pink and non-injected, sclera clear OROPHARYNX: no exudate, no erythema; lips, buccal mucosa, and tongue normal  LUNGS: clear to auscultation and percussion with normal breathing effort HEART: regular rate & rhythm and no murmurs. Trace bilateral lower extremity edema Musculoskeletal: no cyanosis of digits and no clubbing  PSYCH: alert & oriented x 3, fluent speech NEURO: no focal motor/sensory deficits  LABORATORY DATA:  I have reviewed the data as listed    Latest Ref Rng & Units 04/26/2023   10:59 AM 01/11/2023    9:35 AM 10/12/2022   10:05 AM  CBC  WBC 4.0 - 10.5 K/uL 6.3  6.1  5.8   Hemoglobin 12.0 - 15.0 g/dL 96.0  45.4  09.8   Hematocrit 36.0 - 46.0 % 38.6  40.1  38.6   Platelets 150 - 400 K/uL 227  182  172        Latest Ref Rng & Units 12/29/2022    2:54 PM 12/15/2022    4:01 PM 01/19/2022   10:03 AM  CMP  Glucose 70 - 99 mg/dL 119  147  829   BUN 8 - 27 mg/dL 20  22  17    Creatinine 0.57 - 1.00 mg/dL 5.62  1.30  8.65   Sodium 134 - 144 mmol/L 142  144  141   Potassium 3.5 - 5.2 mmol/L 4.4  4.3  3.1   Chloride 96 - 106 mmol/L 106  108  106   CO2 20 - 29 mmol/L 19  20  29    Calcium 8.7 - 10.3 mg/dL 9.4  9.5  9.5   Total Protein 6.5 - 8.1 g/dL   6.3   Total Bilirubin 0.3 - 1.2 mg/dL   0.6   Alkaline Phos 38 - 126 U/L   52   AST 15 - 41 U/L   9   ALT 0 - 44 U/L   11     ASSESSMENT & PLAN Sandra Beasley is  a 73 y.o. female returns for a follow up for history of DVT/PE and nutritional deficiencies.    #H/O bilateral lower extremity DVT and bilateral pulmonary emboli with right heart strain: --Diagnosed in October 2022 with CTA that showed extensive bilateral pulmonary emboli with associated right heart strain. Doppler US showed acute DVT involving the left posterior tibial veins, left peroneal veins and right posterior tibial veins. --Since there is no definitive provoking factor and due to the extensive thrombotic presentation, the recommendation is indefinite anticoagulation.  --Labs from 05/17/2021 ruled our antiphospholipid syndrome.  --Currently on Eliquis maintenance dose of 2.5 mg twice daily.   #Vitamin B12/folate/iron deficiencies: --Likely secondary to gastric banding but has history of erosive gastropathy and duodenal erosions, likely secondary to her lap band in 2019.  --  Underwent EGD/colonoscopy in February 2024. Findings that could have caused IDA include gastritis associated with the lap band, ulcerative polyp in the distal transverse colon, internal hemorrhoids.  --Last received IV venofer 200 mg x 2 doses on 01/28/2022-02/09/2022.  --Labs today shows WBC 6.1, Hgb 12.3, MCV 90.7, Plt 182 --No need for additional IV iron at this time. Okay to hold B12 injections at this time. Can consider restarting these if the levels are low on today's exam.  --RTC in 6 months for labs and follow up.   No orders of the defined types were placed in this encounter.   All questions were answered. The patient knows to call the clinic with any problems, questions or concerns.  I have spent a total of 25 minutes minutes of face-to-face and non-face-to-face time, preparing to see the patient, performing a medically appropriate examination, counseling and educating the patient, ordering tests, documenting clinical information in the electronic health record, and care coordination.   Ulysees Barns,  MD Department of Hematology/Oncology [facility] at [facility] Phone: [phone] Pager: [phone] Email: [email or web address]

## 2023-04-26 NOTE — Progress Notes (Unsigned)
 [facility] [phone]) [phone]   [phone]  PROGRESS NOTE  Patient Care Team: Daisy Floro, MD as PCP - General (Family Medicine) Marykay Lex, MD as PCP - Cardiology (Cardiology)  Hematological/Oncological History -11/09/2020-11/13/2020: Admitted due to progressive dyspnea.  CT angiogram of the chest showed extensive bilateral pulmonary emboli with associated right heart strain.  Doppler ultrasound revealed acute DVT involving the left posterior tibial veins, left peroneal veins and right posterior tibial veins.  Patient was started on IV heparin drip and transition to Eliquis.    -05/17/2021: Establish care with Georga Kaufmann PA-C  -09/22/2021-10/20/2021: Received IV venofer 200 mg once a week x 5 doses  -12/15/2021: Received IV venofer 200 mg x 1 dose  -01/28/2022-02/09/2022: Received IV venofer 200 mg x 2 doses.   CHIEF COMPLAINTS/PURPOSE OF CONSULTATION:  -H/O bilateral DVT and pulmonary emboli -Iron deficiency anemia/folate deficiency/vitamin B12 deficiency  HISTORY OF PRESENTING ILLNESS:  Sandra Beasley 73 y.o. female returns for a follow up for iron, B12 and folate deficiency. She is unaccompanied for this visit.  He was last seen on 01/11/2023.  In the interim she has had no major changes in her health.  On exam today,Sandra Beasley reports she is trying to exercise more frequently as she is planning to go on a [place] in June 2025 that requires lots of walking. She has lost weight after starting mounjaro but has some nausea and abdominal discomfort with the current dose. She reports having diarrhea with lower dose of Mounjaro which is improving with formed stools. She denies easy bruising or signs of active bleeding. She has stable shortness of breath with exertion that is unchanged. She has mild ankle edema right greater than left which improves with taking weekly diuretic.  Otherwise she does not have any questions concerns or complaints  today.. She denies fevers, chills, sweats, chest pain or cough. She has no other complaints. Rest of the 10 point ROS is below  MEDICAL HISTORY:  Past Medical History:  Diagnosis Date   Adenomatous polyp of colon 09/2005   Bilateral pulmonary embolism (HCC) 11/09/2020   PE protocol CTA 11/09/2020: Extensive bilateral pulmonary artery thrombus within numerous bilateral upper lobe, right middle lobe and bilateral lower lobe branches. No saddle PE. Mild cardiomegaly with right-sided heart strain.  McKeown signs seen on TTE with severely reduced RV function, IVS flattening in S&D (RV P & Vol Overload) - PAP ~ 57 mmHg. IAS Bowel to L - high RAP. + McConnell Sign   CAD S/P percutaneous coronary angioplasty - DES x2 in RCA 01/02/2013; 02/2013   a. 100% RPDA - Xience Xpedition DES 2.25 mm x 15 mm + 2.25 mm x 8 mm overlapping, mod- severe distal LAD and prox OM1 lesions.;b.  Myoview 02/2013 & Jan 2020: No ischemia or Infarction, Normal EF.   DVT of lower extremity, bilateral (HCC) 11/2020   Presented with bilateral PEs.  Noted to have bilateral popliteal vein DVTs-follow on a trip to and from [place].   GERD (gastroesophageal reflux disease) 03/06/2013   Hiatal hernia without gangrene and obstruction 11/2020   CTA of the chest showed: Moderate size hiatal hernia with evidence of prior Lap-Band. Also noted cholelithiasis   Hyperlipidemia associated with type 2 diabetes mellitus (HCC)    Hypertension    Obesity    STEMI (ST elevation myocardial infarction) (HCC) 01/02/2013   --> PCI; EF 60 and 65%. Gr 1 DD & No regional WMA, Otherwise relatively normal.   Type 2  diabetes mellitus with circulatory disorder (HCC)    Vitamin D deficiency     SURGICAL HISTORY: Past Surgical History:  Procedure Laterality Date   ABDOMINAL HYSTERECTOMY     CORONARY ANGIOPLASTY WITH STENT PLACEMENT  01/02/2013   PCI-RCA: Xience Xpedition 2.25 mm x 15 mm, 2.25 mm x 8 mm (2.4 mm)   LAPAROSCOPIC GASTRIC BANDING  2006    LAPAROSCOPIC HYSTERECTOMY  1996   LEFT HEART CATHETERIZATION WITH CORONARY ANGIOGRAM N/A 01/02/2013   Procedure: LEFT HEART CATHETERIZATION WITH CORONARY ANGIOGRAM;  Surgeon: Marykay Lex, MD;  Location: [facility] CATH LAB;: Inferior STEMI: 100% PDA; mid-distal LAD beyond D1 (small caliber) 80%.  Ostial D1 40%.  Lateral OM 1 with 70-80% stenosis.  Large branching ramus intermedius with OM and DIAG branch branches.   NM MYOVIEW LTD  01/'15; 1/'20   a) EF 60-65%.  Normal wall motion.  No ischemia or infarction.; b) EF 70 to 75%.  No EKG changes.  No evidence of ischemia or infarction.:   TONSILLECTOMY  1966   TRANSTHORACIC ECHOCARDIOGRAM  01/04/2013   (Post inferior STEMI) EF 60 and 65%. Grade 1 diastolic dysfunction with no regional wall motion abnormalities. Otherwise relatively normal.   TRANSTHORACIC ECHOCARDIOGRAM  11/10/2020   (In setting of PE) EF 55 to 60%.  No RWMA.  GR 1 DD.  IVS flattening in systole and diastole consistent with RV pressure volume overload.  Severely reduced RV function with mildly elevated PAP roughly 57 mmHg.  Normal atrial sizes, I-S bowing to the left suggesting high RAP.  McConnell sign also noted suggesting a pulmonary embolus.   TRANSTHORACIC ECHOCARDIOGRAM  02/23/2021   3 months post PE: EF 65 to 70%.  No RWMA.  GR 1 DD with moderate LA dilation.  Unable to assess PAP.  Normal aortic and mitral valves.  Ascending Aorta measured at 37 mmHg. Comparison(s): 11/10/20 EF 55-60%. PA pressure    SOCIAL HISTORY: Social History   Socioeconomic History   Marital status: Divorced    Spouse name: Not on file   Number of children: 3   Years of education: Not on file   Highest education level: Not on file  Occupational History   Occupation: Teacher  Tobacco Use   Smoking status: Never   Smokeless tobacco: Never  Vaping Use   Vaping status: Never Used  Substance and Sexual Activity   Alcohol use: No   Drug use: No   Sexual activity: Not on file  Other Topics  Concern   Not on file  Social History Narrative   She is a divorced nonsmoker who works for [employer]. She is currently now in cardiac rehabilitation. Has changed her dietary habits to a Vegan lifestyle.   Social Drivers of Corporate investment banker Strain: Not on file  Food Insecurity: Not on file  Transportation Needs: Not on file  Physical Activity: Not on file  Stress: Not on file  Social Connections: Not on file  Intimate Partner Violence: Not on file    FAMILY HISTORY: Family History  Adopted: Yes  Problem Relation Age of Onset   Breast cancer Maternal Aunt    Diabetes Maternal Aunt    Heart disease Maternal Aunt    Diabetes Maternal Uncle    Diabetes gravidarum Maternal Uncle    Colon cancer Neg Hx    Stomach cancer Neg Hx    Rectal cancer Neg Hx     ALLERGIES:  is allergic to ace inhibitors, actos [pioglitazone], other, and statins.  MEDICATIONS:  Current Outpatient Medications  Medication Sig Dispense Refill   amLODipine (NORVASC) 10 MG tablet Take 1 tablet (10 mg total) by mouth daily. 90 tablet 3   candesartan (ATACAND) 32 MG tablet Take 32 mg by mouth daily.     carvedilol (COREG) 25 MG tablet Take 25 mg by mouth 2 (two) times daily.     Cholecalciferol (VITAMIN D3) 25 MCG (1000 UT) capsule 1 capsule Orally Once a day     ELIQUIS 2.5 MG TABS tablet Take 2.5 mg by mouth 2 (two) times daily.     Lancets (ONETOUCH DELICA PLUS LANCET30G) MISC daily as needed.     metFORMIN (GLUCOPHAGE) 500 MG tablet Take 2 by mouth in am and 1 in pm     metroNIDAZOLE (METROCREAM) 0.75 % cream Apply 1 application topically 2 (two) times daily as needed (rosacea). Apply to face     MOUNJARO 7.5 MG/0.5ML Pen Inject 7.5 mg into the skin once a week.     nitroGLYCERIN (NITROSTAT) 0.4 MG SL tablet Place 1 tablet (0.4 mg total) under the tongue every 5 (five) minutes as needed for chest pain. 25 tablet 1   ONETOUCH VERIO test strip 1 each by Other route as needed.     potassium  chloride (KLOR-CON) 10 MEQ tablet Take 10 meq once a day as needed with Torsemide. 90 tablet 3   repaglinide (PRANDIN) 0.5 MG tablet Take 0.5 mg by mouth 3 (three) times daily before meals.     rosuvastatin (CRESTOR) 10 MG tablet Take 10 mg by mouth daily.  4   torsemide (DEMADEX) 20 MG tablet Take 20 mg once a day as needed 90 tablet 3   triamcinolone cream (KENALOG) 0.1 % Apply 1 application  topically 2 (two) times daily as needed (rash/irritation).     MOUNJARO 2.5 MG/0.5ML Pen Inject 2.5 mg into the skin.     pantoprazole (PROTONIX) 40 MG tablet Take 1 tablet (40 mg total) by mouth daily. (Patient taking differently: Take 40 mg by mouth every other day.) 30 tablet 1   No current facility-administered medications for this visit.    REVIEW OF SYSTEMS:   Constitutional: ( - ) fevers, ( - )  chills , ( - ) night sweats Eyes: ( - ) blurriness of vision, ( - ) double vision, ( - ) watery eyes Ears, nose, mouth, throat, and face: ( - ) mucositis, ( - ) sore throat Respiratory: ( - ) cough, ( + ) dyspnea, ( - ) wheezes Cardiovascular: ( - ) palpitation, ( - ) chest discomfort, (+ ) lower extremity swelling Gastrointestinal:  ( - ) nausea, ( - ) heartburn, ( - ) change in bowel habits Skin: ( - ) abnormal skin rashes Lymphatics: ( - ) new lymphadenopathy, ( - ) easy bruising Neurological: ( - ) numbness, ( - ) tingling, ( - ) new weaknesses Behavioral/Psych: ( - ) mood change, ( - ) new changes  All other systems were reviewed with the patient and are negative.  PHYSICAL EXAMINATION: ECOG PERFORMANCE STATUS: 1 - Symptomatic but completely ambulatory  Vitals:   04/26/23 1115  BP: 131/73  Pulse: (!) 108  Resp: 18  Temp: 97.7 F (36.5 C)  SpO2: 93%    Filed Weights   04/26/23 1115  Weight: 254 lb 4.8 oz (115.3 kg)     GENERAL: well appearing female in NAD SKIN: skin color, texture, turgor are normal, no rashes or significant lesions EYES: conjunctiva are pink and  non-injected,  sclera clear OROPHARYNX: no exudate, no erythema; lips, buccal mucosa, and tongue normal  LUNGS: clear to auscultation and percussion with normal breathing effort HEART: regular rate & rhythm and no murmurs.  Musculoskeletal: no cyanosis of digits and no clubbing  PSYCH: alert & oriented x 3, fluent speech NEURO: no focal motor/sensory deficits  LABORATORY DATA:  I have reviewed the data as listed    Latest Ref Rng & Units 04/26/2023   10:59 AM 01/11/2023    9:35 AM 10/12/2022   10:05 AM  CBC  WBC 4.0 - 10.5 K/uL 6.3  6.1  5.8   Hemoglobin 12.0 - 15.0 g/dL 40.9  81.1  91.4   Hematocrit 36.0 - 46.0 % 38.6  40.1  38.6   Platelets 150 - 400 K/uL 227  182  172        Latest Ref Rng & Units 04/26/2023   10:59 AM 12/29/2022    2:54 PM 12/15/2022    4:01 PM  CMP  Glucose 70 - 99 mg/dL 782  956  213   BUN 8 - 23 mg/dL 23  20  22    Creatinine 0.44 - 1.00 mg/dL 0.86  5.78  4.69   Sodium 135 - 145 mmol/L 140  142  144   Potassium 3.5 - 5.1 mmol/L 3.9  4.4  4.3   Chloride 98 - 111 mmol/L 107  106  108   CO2 22 - 32 mmol/L 26  19  20    Calcium 8.9 - 10.3 mg/dL 9.2  9.4  9.5   Total Protein 6.5 - 8.1 g/dL 6.8     Total Bilirubin 0.0 - 1.2 mg/dL 0.7     Alkaline Phos 38 - 126 U/L 58     AST 15 - 41 U/L 10     ALT 0 - 44 U/L 9       ASSESSMENT & PLAN Sandra Beasley is a 73 y.o. female returns for a follow up for history of DVT/PE and nutritional deficiencies.    #H/O bilateral lower extremity DVT and bilateral pulmonary emboli with right heart strain: --Diagnosed in October 2022 with CTA that showed extensive bilateral pulmonary emboli with associated right heart strain. Doppler US showed acute DVT involving the left posterior tibial veins, left peroneal veins and right posterior tibial veins. --Since there is no definitive provoking factor and due to the extensive thrombotic presentation, the recommendation is indefinite anticoagulation.  --Labs from 05/17/2021 ruled out antiphospholipid  syndrome.  --Currently on Eliquis maintenance dose of 2.5 mg twice daily.   #Vitamin B12/folate/iron deficiencies: --Likely secondary to gastric banding but has history of erosive gastropathy and duodenal erosions, likely secondary to her lap band in 2019.  --Underwent EGD/colonoscopy in February 2024. Findings that could have caused IDA include gastritis associated with the lap band, ulcerative polyp in the distal transverse colon, internal hemorrhoids.  --Last received IV venofer 200 mg x 2 doses on 01/28/2022-02/09/2022.  --Labs today show no evidence of anemia with Hgb 12.2, MCV 86.0. Iron panel shows no deficiency with serum iron 59, saturation 18%, ferritin 24. Vitamin B12 and folate levels are normal.  --Recommend IV venofer 200 mg x 2 doses to boost iron levels. Okay to hold B12 injections at this time.  --RTC in 3 months for lab check and  6 months for labs and follow up.   Orders Placed This Encounter  Procedures   CBC with Differential (Cancer Center Only)    Standing Status:   Future  Expected Date:   07/03/2023    Expiration Date:   04/25/2024   CMP (Cancer Center only)    Standing Status:   Future    Expected Date:   07/03/2023    Expiration Date:   04/25/2024   Ferritin    Standing Status:   Future    Expected Date:   07/03/2023    Expiration Date:   04/25/2024   Iron and Iron Binding Capacity ([facility] only)    Standing Status:   Future    Expected Date:   07/03/2023    Expiration Date:   04/25/2024   Vitamin B12    Standing Status:   Future    Expected Date:   07/03/2023    Expiration Date:   04/25/2024   Folate, Serum    Standing Status:   Future    Expected Date:   07/03/2023    Expiration Date:   04/25/2024    All questions were answered. The patient knows to call the clinic with any problems, questions or concerns.  I have spent a total of 25 minutes minutes of face-to-face and non-face-to-face time, preparing to see the patient, performing a medically appropriate  examination, counseling and educating the patient, ordering tests, documenting clinical information in the electronic health record, and care coordination.   Georga Kaufmann PA-C Dept of Hematology and Oncology [facility] Phone: [phone]

## 2023-04-28 ENCOUNTER — Encounter: Payer: Self-pay | Admitting: Physician Assistant

## 2023-04-28 ENCOUNTER — Telehealth: Payer: Self-pay

## 2023-04-28 NOTE — Telephone Encounter (Signed)
-----   Message from Briant Cedar sent at 04/27/2023  9:28 AM EDT ----- Please notify patient no evidence of B12 or folate deficiencies. Her iron is low end of normal. Suggest 2 doses of IV iron to boost levels to prevent anemia. ----- Message ----- From: Interface, Lab In [facility] Sent: 04/26/2023  11:13 AM EDT To: Briant Cedar, PA-C

## 2023-04-28 NOTE — Telephone Encounter (Signed)
 Pt advised with VU and after insurance approval will be notified by [facility] for appt date and time

## 2023-05-01 ENCOUNTER — Telehealth: Payer: Self-pay

## 2023-05-01 NOTE — Telephone Encounter (Signed)
 Sandra Beasley, patient will be scheduled as soon as possible.  Auth Submission: NO AUTH NEEDED Site of care: Site of care: [facility] Payer: [employer] medicare Medication & CPT/J Code(s) submitted: Venofer (Iron Sucrose) J1756 Route of submission (phone, fax, portal): portal Phone # Fax # Auth type: Buy/Bill PB Units/visits requested: 200mg  x 2 doses Reference number: [id number] Approval from: 05/01/23 to 11/01/23

## 2023-05-03 ENCOUNTER — Encounter: Payer: Self-pay | Admitting: Physician Assistant

## 2023-05-08 ENCOUNTER — Ambulatory Visit (INDEPENDENT_AMBULATORY_CARE_PROVIDER_SITE_OTHER)

## 2023-05-08 ENCOUNTER — Other Ambulatory Visit: Payer: Self-pay

## 2023-05-08 VITALS — BP 127/78 | HR 70 | Temp 98.7°F | Resp 20 | Ht 62.0 in | Wt 252.2 lb

## 2023-05-08 DIAGNOSIS — Z9884 Bariatric surgery status: Secondary | ICD-10-CM

## 2023-05-08 DIAGNOSIS — D508 Other iron deficiency anemias: Secondary | ICD-10-CM

## 2023-05-08 DIAGNOSIS — D509 Iron deficiency anemia, unspecified: Secondary | ICD-10-CM | POA: Diagnosis not present

## 2023-05-08 MED ORDER — IRON SUCROSE 20 MG/ML IV SOLN
200.0000 mg | Freq: Once | INTRAVENOUS | Status: AC
  Administered 2023-05-08: 200 mg via INTRAVENOUS
  Filled 2023-05-08: qty 10

## 2023-05-08 NOTE — Progress Notes (Signed)
 Diagnosis: Iron Deficiency Anemia  Provider:  Chilton Greathouse MD  Procedure: IV Push  IV Type: Peripheral, IV Location: R Antecubital  Venofer (Iron Sucrose), Dose: 200 mg  Post Infusion IV Care: Patient declined observation and Peripheral IV Discontinued  Discharge: Condition: Stable, Destination: Home . AVS Provided  Performed by:  Wyvonne Lenz, RN

## 2023-05-15 ENCOUNTER — Encounter: Payer: Self-pay | Admitting: Podiatry

## 2023-05-15 ENCOUNTER — Ambulatory Visit (INDEPENDENT_AMBULATORY_CARE_PROVIDER_SITE_OTHER): Payer: Medicare HMO | Admitting: Podiatry

## 2023-05-15 VITALS — Ht 62.0 in | Wt 252.2 lb

## 2023-05-15 DIAGNOSIS — B351 Tinea unguium: Secondary | ICD-10-CM

## 2023-05-15 DIAGNOSIS — L84 Corns and callosities: Secondary | ICD-10-CM | POA: Diagnosis not present

## 2023-05-15 DIAGNOSIS — E119 Type 2 diabetes mellitus without complications: Secondary | ICD-10-CM | POA: Diagnosis not present

## 2023-05-15 DIAGNOSIS — M79675 Pain in left toe(s): Secondary | ICD-10-CM | POA: Diagnosis not present

## 2023-05-15 DIAGNOSIS — M79674 Pain in right toe(s): Secondary | ICD-10-CM

## 2023-05-16 NOTE — Progress Notes (Signed)
  Subjective:  Patient ID: Sandra Beasley, female    DOB: 02/02/1951,  MRN: [id number]  Chief Complaint  Patient presents with   Nail Problem    Pt is here for [facility].     73 y.o. female returns for follow-up with the above complaint. History confirmed with patient.  Great toenails have been feeling better with debridement. Calluses thickened again  Objective:  Physical Exam: warm, good capillary refill, no trophic changes or ulcerative lesions, normal DP and PT pulses, normal monofilament exam, normal sensory exam and onychomycosis.  Pinch callus bilateral hallux.  Thickening and ingrowing nails with subungual debris of multiple toenails (all 10, hallux are worst in severity) Assessment:   1. Pain due to onychomycosis of toenails of both feet   2. Callus of foot   3. Type 2 diabetes mellitus without complication, without long-term current use of insulin (HCC)         Plan:  Patient was evaluated and treated and all questions answered.  Discussed the etiology and treatment options for the condition in detail with the patient. . Recommended debridement of the nails today. Sharp and mechanical debridement performed of all painful and mycotic nails today. Nails debrided in length and thickness using a nail nipper to level of comfort. Discussed treatment options including appropriate shoe gear. Follow up as needed for painful nails.    All symptomatic hyperkeratoses were safely debrided with a sterile #15 blade to patient's level of comfort without incident. We discussed preventative and palliative care of these lesions including supportive and accommodative shoegear, padding, prefabricated and custom molded accommodative orthoses, use of a pumice stone and lotions/creams daily.    Return in about 3 months (around 08/14/2023) for at risk diabetic foot care.

## 2023-05-19 ENCOUNTER — Ambulatory Visit

## 2023-05-19 VITALS — BP 150/74 | HR 66 | Temp 97.7°F | Resp 18 | Ht 62.0 in | Wt 254.4 lb

## 2023-05-19 DIAGNOSIS — D508 Other iron deficiency anemias: Secondary | ICD-10-CM

## 2023-05-19 DIAGNOSIS — Z9884 Bariatric surgery status: Secondary | ICD-10-CM

## 2023-05-19 MED ORDER — IRON SUCROSE 20 MG/ML IV SOLN
200.0000 mg | Freq: Once | INTRAVENOUS | Status: AC
  Administered 2023-05-19: 200 mg via INTRAVENOUS
  Filled 2023-05-19: qty 10

## 2023-05-19 NOTE — Patient Instructions (Signed)

## 2023-05-19 NOTE — Progress Notes (Signed)
 Diagnosis: Iron Deficiency Anemia  Provider:  Chilton Greathouse MD  Procedure: IV Push  IV Type: Peripheral, IV Location: L Antecubital  Venofer (Iron Sucrose), Dose: 200 mg  Post Infusion IV Care: Patient declined observation  Discharge: Condition: Good, Destination: Home . AVS Provided  Performed by:  Adriana Mccallum, RN

## 2023-07-12 ENCOUNTER — Inpatient Hospital Stay: Payer: Medicare HMO | Attending: Physician Assistant

## 2023-07-12 ENCOUNTER — Inpatient Hospital Stay: Payer: Medicare HMO | Attending: Physician Assistant | Admitting: Physician Assistant

## 2023-07-12 ENCOUNTER — Ambulatory Visit: Payer: Self-pay | Admitting: Physician Assistant

## 2023-07-12 VITALS — BP 134/76 | HR 73 | Temp 97.7°F | Resp 18 | Ht 62.0 in | Wt 257.7 lb

## 2023-07-12 DIAGNOSIS — D508 Other iron deficiency anemias: Secondary | ICD-10-CM

## 2023-07-12 DIAGNOSIS — D509 Iron deficiency anemia, unspecified: Secondary | ICD-10-CM | POA: Diagnosis present

## 2023-07-12 DIAGNOSIS — E538 Deficiency of other specified B group vitamins: Secondary | ICD-10-CM

## 2023-07-12 DIAGNOSIS — Z86711 Personal history of pulmonary embolism: Secondary | ICD-10-CM | POA: Diagnosis not present

## 2023-07-12 DIAGNOSIS — I2699 Other pulmonary embolism without acute cor pulmonale: Secondary | ICD-10-CM

## 2023-07-12 DIAGNOSIS — Z7901 Long term (current) use of anticoagulants: Secondary | ICD-10-CM | POA: Diagnosis not present

## 2023-07-12 DIAGNOSIS — Z86718 Personal history of other venous thrombosis and embolism: Secondary | ICD-10-CM | POA: Diagnosis not present

## 2023-07-12 DIAGNOSIS — Z9884 Bariatric surgery status: Secondary | ICD-10-CM | POA: Diagnosis not present

## 2023-07-12 DIAGNOSIS — Z803 Family history of malignant neoplasm of breast: Secondary | ICD-10-CM | POA: Diagnosis not present

## 2023-07-12 LAB — CMP (CANCER CENTER ONLY)
ALT: 15 U/L (ref 0–44)
AST: 11 U/L — ABNORMAL LOW (ref 15–41)
Albumin: 3.7 g/dL (ref 3.5–5.0)
Alkaline Phosphatase: 69 U/L (ref 38–126)
Anion gap: 6 (ref 5–15)
BUN: 19 mg/dL (ref 8–23)
CO2: 23 mmol/L (ref 22–32)
Calcium: 9 mg/dL (ref 8.9–10.3)
Chloride: 108 mmol/L (ref 98–111)
Creatinine: 1.44 mg/dL — ABNORMAL HIGH (ref 0.44–1.00)
GFR, Estimated: 38 mL/min — ABNORMAL LOW (ref >60)
Glucose, Bld: 179 mg/dL — ABNORMAL HIGH (ref 70–99)
Potassium: 3.9 mmol/L (ref 3.5–5.1)
Sodium: 137 mmol/L (ref 135–145)
Total Bilirubin: 0.6 mg/dL (ref 0.0–1.2)
Total Protein: 6.8 g/dL (ref 6.5–8.1)

## 2023-07-12 LAB — CBC WITH DIFFERENTIAL (CANCER CENTER ONLY)
Abs Immature Granulocytes: 0.02 10*3/uL (ref 0.00–0.07)
Basophils Absolute: 0.1 10*3/uL (ref 0.0–0.1)
Basophils Relative: 1 %
Eosinophils Absolute: 0.2 10*3/uL (ref 0.0–0.5)
Eosinophils Relative: 4 %
HCT: 39.8 % (ref 36.0–46.0)
Hemoglobin: 12.9 g/dL (ref 12.0–15.0)
Immature Granulocytes: 0 %
Lymphocytes Relative: 21 %
Lymphs Abs: 1.3 10*3/uL (ref 0.7–4.0)
MCH: 27.6 pg (ref 26.0–34.0)
MCHC: 32.4 g/dL (ref 30.0–36.0)
MCV: 85.2 fL (ref 80.0–100.0)
Monocytes Absolute: 0.5 10*3/uL (ref 0.1–1.0)
Monocytes Relative: 7 %
Neutro Abs: 4.2 10*3/uL (ref 1.7–7.7)
Neutrophils Relative %: 67 %
Platelet Count: 208 10*3/uL (ref 150–400)
RBC: 4.67 MIL/uL (ref 3.87–5.11)
RDW: 14.7 % (ref 11.5–15.5)
WBC Count: 6.3 10*3/uL (ref 4.0–10.5)
nRBC: 0 % (ref 0.0–0.2)

## 2023-07-12 LAB — FERRITIN: Ferritin: 63 ng/mL (ref 11–307)

## 2023-07-12 LAB — IRON AND IRON BINDING CAPACITY (CC-WL,HP ONLY)
Iron: 61 ug/dL (ref 28–170)
Saturation Ratios: 21 % (ref 10.4–31.8)
TIBC: 291 ug/dL (ref 250–450)
UIBC: 230 ug/dL (ref 148–442)

## 2023-07-12 LAB — FOLATE: Folate: 6 ng/mL (ref >5.9)

## 2023-07-12 LAB — VITAMIN B12: Vitamin B-12: 671 pg/mL (ref 180–914)

## 2023-07-12 NOTE — Progress Notes (Signed)
 [facility] [phone]   [phone]) [phone]  PROGRESS NOTE  Patient Care Team: Jimmey Mould, MD as PCP - General (Family Medicine) Arleen Lacer, MD as PCP - Cardiology (Cardiology)  Hematological/Oncological History -11/09/2020-11/13/2020: Admitted due to progressive dyspnea.  CT angiogram of the chest showed extensive bilateral pulmonary emboli with associated right heart strain.  Doppler ultrasound revealed acute DVT involving the left posterior tibial veins, left peroneal veins and right posterior tibial veins.  Patient was started on IV heparin  drip and transition to Eliquis .    -05/17/2021: Establish care with Wyline Hearing PA-C  -09/22/2021-10/20/2021: Received IV venofer  200 mg once a week x 5 doses  -12/15/2021: Received IV venofer  200 mg x 1 dose  -01/28/2022-02/09/2022: Received IV venofer  200 mg x 2 doses.   -05/08/2023-05/19/2023: Received IV venofer  200 mg x 2 doses  CHIEF COMPLAINTS/PURPOSE OF CONSULTATION:  -H/O bilateral DVT and pulmonary emboli -Iron  deficiency anemia/folate deficiency/vitamin B12 deficiency  HISTORY OF PRESENTING ILLNESS:  Sandra Beasley 73 y.o. female returns for a follow up for iron , B12 and folate deficiency. She is unaccompanied for this visit.  He was last seen on 04/26/2023.  In the interim she received IV venofer  200 mg x 2 doses.   On exam today,Sandra Beasley reports her energy levels are good and she is excited to go on her [place] next week. She reports her leg pain improved after discontinuing her statin medication. She denies any bruising or bleeding episodes such as hematochezia or melena. She did notice some shortness of breath with exertion that was worse than usual.  Otherwise she does not have any questions concerns or complaints today.. She denies fevers, chills, sweats, chest pain or cough. She has no other complaints. Rest of the 10 point ROS is below  MEDICAL HISTORY:  Past Medical History:   Diagnosis Date   Adenomatous polyp of colon 09/2005   Bilateral pulmonary embolism (HCC) 11/09/2020   PE protocol CTA 11/09/2020: Extensive bilateral pulmonary artery thrombus within numerous bilateral upper lobe, right middle lobe and bilateral lower lobe branches. No saddle PE. Mild cardiomegaly with right-sided heart strain.  McKeown signs seen on TTE with severely reduced RV function, IVS flattening in S&D (RV P & Vol Overload) - PAP ~ 57 mmHg. IAS Bowel to L - high RAP. + McConnell Sign   CAD S/P percutaneous coronary angioplasty - DES x2 in RCA 01/02/2013; 02/2013   a. 100% RPDA - Xience Xpedition DES 2.25 mm x 15 mm + 2.25 mm x 8 mm overlapping, mod- severe distal LAD and prox OM1 lesions.;b.  Myoview  02/2013 & Jan 2020: No ischemia or Infarction, Normal EF.   DVT of lower extremity, bilateral (HCC) 11/2020   Presented with bilateral PEs.  Noted to have bilateral popliteal vein DVTs-follow on a trip to and from [place] .   GERD (gastroesophageal reflux disease) 03/06/2013   Hiatal hernia without gangrene and obstruction 11/2020   CTA of the chest showed: Moderate size hiatal hernia with evidence of prior Lap-Band. Also noted cholelithiasis   Hyperlipidemia associated with type 2 diabetes mellitus (HCC)    Hypertension    Obesity    STEMI (ST elevation myocardial infarction) (HCC) 01/02/2013   --> PCI; EF 60 and 65%. Gr 1 DD & No regional WMA, Otherwise relatively normal.   Type 2 diabetes mellitus with circulatory disorder (HCC)    Vitamin D  deficiency     SURGICAL HISTORY: Past Surgical History:  Procedure Laterality Date   ABDOMINAL  HYSTERECTOMY     CORONARY ANGIOPLASTY WITH STENT PLACEMENT  01/02/2013   PCI-RCA: Xience Xpedition 2.25 mm x 15 mm, 2.25 mm x 8 mm (2.4 mm)   LAPAROSCOPIC GASTRIC BANDING  2006   LAPAROSCOPIC HYSTERECTOMY  1996   LEFT HEART CATHETERIZATION WITH CORONARY ANGIOGRAM N/A 01/02/2013   Procedure: LEFT HEART CATHETERIZATION WITH CORONARY ANGIOGRAM;  Surgeon:  Arleen Lacer, MD;  Location: [facility] CATH LAB;: Inferior STEMI: 100% PDA; mid-distal LAD beyond D1 (small caliber) 80%.  Ostial D1 40%.  Lateral OM 1 with 70-80% stenosis.  Large branching ramus intermedius with OM and DIAG branch branches.   NM MYOVIEW  LTD  01/'15; 1/'20   a) EF 60-65%.  Normal wall motion.  No ischemia or infarction.; b) EF 70 to 75%.  No EKG changes.  No evidence of ischemia or infarction.:   TONSILLECTOMY  1966   TRANSTHORACIC ECHOCARDIOGRAM  01/04/2013   (Post inferior STEMI) EF 60 and 65%. Grade 1 diastolic dysfunction with no regional wall motion abnormalities. Otherwise relatively normal.   TRANSTHORACIC ECHOCARDIOGRAM  11/10/2020   (In setting of PE) EF 55 to 60%.  No RWMA.  GR 1 DD.  IVS flattening in systole and diastole consistent with RV pressure volume overload.  Severely reduced RV function with mildly elevated PAP roughly 57 mmHg.  Normal atrial sizes, I-S bowing to the left suggesting high RAP.  McConnell sign also noted suggesting a pulmonary embolus.   TRANSTHORACIC ECHOCARDIOGRAM  02/23/2021   3 months post PE: EF 65 to 70%.  No RWMA.  GR 1 DD with moderate LA dilation.  Unable to assess PAP.  Normal aortic and mitral valves.  Ascending Aorta measured at 37 mmHg. Comparison(s): 11/10/20 EF 55-60%. PA pressure    SOCIAL HISTORY: Social History   Socioeconomic History   Marital status: Divorced    Spouse name: Not on file   Number of children: 3   Years of education: Not on file   Highest education level: Not on file  Occupational History   Occupation: Teacher  Tobacco Use   Smoking status: Never   Smokeless tobacco: Never  Vaping Use   Vaping status: Never Used  Substance and Sexual Activity   Alcohol use: No   Drug use: No   Sexual activity: Not on file  Other Topics Concern   Not on file  Social History Narrative   She is a divorced nonsmoker who works for [employer]. She is currently now in cardiac rehabilitation. Has changed her  dietary habits to a Vegan lifestyle.   Social Drivers of Corporate investment banker Strain: Not on file  Food Insecurity: Not on file  Transportation Needs: Not on file  Physical Activity: Not on file  Stress: Not on file  Social Connections: Not on file  Intimate Partner Violence: Not on file    FAMILY HISTORY: Family History  Adopted: Yes  Problem Relation Age of Onset   Breast cancer Maternal Aunt    Diabetes Maternal Aunt    Heart disease Maternal Aunt    Diabetes Maternal Uncle    Diabetes gravidarum Maternal Uncle    Colon cancer Neg Hx    Stomach cancer Neg Hx    Rectal cancer Neg Hx     ALLERGIES:  is allergic to ace inhibitors, actos [pioglitazone], other, ozempic (0.25 or 0.5 mg-dose) [semaglutide(0.25 or 0.5mg -dos)], and statins.  MEDICATIONS:  Current Outpatient Medications  Medication Sig Dispense Refill   amLODipine  (NORVASC ) 2.5 MG tablet Take 2.5  mg by mouth daily. Dose depending on BP     candesartan (ATACAND) 32 MG tablet Take 32 mg by mouth daily.     carvedilol  (COREG ) 25 MG tablet Take 25 mg by mouth 2 (two) times daily.     Cholecalciferol (VITAMIN D3) 25 MCG (1000 UT) capsule 1 capsule Orally Once a day     ELIQUIS  2.5 MG TABS tablet Take 2.5 mg by mouth 2 (two) times daily.     Lancets (ONETOUCH DELICA PLUS LANCET30G) MISC daily as needed.     metFORMIN  (GLUCOPHAGE ) 500 MG tablet Take 500 mg by mouth 2 (two) times daily with a meal.     metroNIDAZOLE (METROCREAM) 0.75 % cream Apply 1 application topically 2 (two) times daily as needed (rosacea). Apply to face     nitroGLYCERIN  (NITROSTAT ) 0.4 MG SL tablet Place 1 tablet (0.4 mg total) under the tongue every 5 (five) minutes as needed for chest pain. 25 tablet 1   ONETOUCH VERIO test strip 1 each by Other route as needed.     potassium chloride  (KLOR-CON ) 10 MEQ tablet Take 10 meq once a day as needed with Torsemide . 90 tablet 3   repaglinide (PRANDIN) 0.5 MG tablet Take 0.5 mg by mouth 3 (three)  times daily before meals.     tirzepatide (MOUNJARO) 10 MG/0.5ML Pen Inject 7.5 mg into the skin once a week.     torsemide  (DEMADEX ) 20 MG tablet Take 20 mg once a day as needed 90 tablet 3   triamcinolone cream (KENALOG) 0.1 % Apply 1 application  topically 2 (two) times daily as needed (rash/irritation).     amLODipine  (NORVASC ) 10 MG tablet Take 1 tablet (10 mg total) by mouth daily. 90 tablet 3   MOUNJARO 2.5 MG/0.5ML Pen Inject 2.5 mg into the skin. (Patient not taking: Reported on 07/12/2023)     pantoprazole  (PROTONIX ) 40 MG tablet Take 1 tablet (40 mg total) by mouth daily. (Patient taking differently: Take 40 mg by mouth every other day.) 30 tablet 1   rosuvastatin  (CRESTOR ) 10 MG tablet Take 10 mg by mouth daily. (Patient not taking: Reported on 07/12/2023)  4   No current facility-administered medications for this visit.    REVIEW OF SYSTEMS:   Constitutional: ( - ) fevers, ( - )  chills , ( - ) night sweats Eyes: ( - ) blurriness of vision, ( - ) double vision, ( - ) watery eyes Ears, nose, mouth, throat, and face: ( - ) mucositis, ( - ) sore throat Respiratory: ( - ) cough, ( + ) dyspnea, ( - ) wheezes Cardiovascular: ( - ) palpitation, ( - ) chest discomfort, (+ ) lower extremity swelling Gastrointestinal:  ( - ) nausea, ( - ) heartburn, ( - ) change in bowel habits Skin: ( - ) abnormal skin rashes Lymphatics: ( - ) new lymphadenopathy, ( - ) easy bruising Neurological: ( - ) numbness, ( - ) tingling, ( - ) new weaknesses Behavioral/Psych: ( - ) mood change, ( - ) new changes  All other systems were reviewed with the patient and are negative.  PHYSICAL EXAMINATION: ECOG PERFORMANCE STATUS: 0 - Asymptomatic  Vitals:   07/12/23 1121  BP: 134/76  Pulse: 73  Resp: 18  Temp: 97.7 F (36.5 C)  SpO2: 95%    Filed Weights   07/12/23 1121  Weight: 257 lb 11.2 oz (116.9 kg)     GENERAL: well appearing female in NAD SKIN: skin color, texture, turgor are  normal, no rashes  or significant lesions EYES: conjunctiva are pink and non-injected, sclera clear OROPHARYNX: no exudate, no erythema; lips, buccal mucosa, and tongue normal  LUNGS: clear to auscultation and percussion with normal breathing effort HEART: regular rate & rhythm and no murmurs.  Musculoskeletal: no cyanosis of digits and no clubbing  PSYCH: alert & oriented x 3, fluent speech NEURO: no focal motor/sensory deficits  LABORATORY DATA:  I have reviewed the data as listed    Latest Ref Rng & Units 07/12/2023   11:02 AM 04/26/2023   10:59 AM 01/11/2023    9:35 AM  CBC  WBC 4.0 - 10.5 K/uL 6.3  6.3  6.1   Hemoglobin 12.0 - 15.0 g/dL 14.7  82.9  56.2   Hematocrit 36.0 - 46.0 % 39.8  38.6  40.1   Platelets 150 - 400 K/uL 208  227  182        Latest Ref Rng & Units 07/12/2023   11:02 AM 04/26/2023   10:59 AM 12/29/2022    2:54 PM  CMP  Glucose 70 - 99 mg/dL 130  865  784   BUN 8 - 23 mg/dL 19  23  20    Creatinine 0.44 - 1.00 mg/dL 6.96  2.95  2.84   Sodium 135 - 145 mmol/L 137  140  142   Potassium 3.5 - 5.1 mmol/L 3.9  3.9  4.4   Chloride 98 - 111 mmol/L 108  107  106   CO2 22 - 32 mmol/L 23  26  19    Calcium  8.9 - 10.3 mg/dL 9.0  9.2  9.4   Total Protein 6.5 - 8.1 g/dL 6.8  6.8    Total Bilirubin 0.0 - 1.2 mg/dL 0.6  0.7    Alkaline Phos 38 - 126 U/L 69  58    AST 15 - 41 U/L 11  10    ALT 0 - 44 U/L 15  9      ASSESSMENT & PLAN Sandra Beasley is a 73 y.o. female returns for a follow up for history of DVT/PE and nutritional deficiencies.    #H/O bilateral lower extremity DVT and bilateral pulmonary emboli with right heart strain: --Diagnosed in October 2022 with CTA that showed extensive bilateral pulmonary emboli with associated right heart strain. Doppler US  showed acute DVT involving the left posterior tibial veins, left peroneal veins and right posterior tibial veins. --Since there is no definitive provoking factor and due to the extensive thrombotic presentation, the  recommendation is indefinite anticoagulation.  --Labs from 05/17/2021 ruled out antiphospholipid syndrome.  --Currently on Eliquis  maintenance dose of 2.5 mg twice daily.   #Vitamin B12/folate/iron  deficiencies: --Likely secondary to gastric banding but has history of erosive gastropathy and duodenal erosions, likely secondary to her lap band in 2019.  --Underwent EGD/colonoscopy in February 2024. Findings that could have caused IDA include gastritis associated with the lap band, ulcerative polyp in the distal transverse colon, internal hemorrhoids.  --Last received IV venofer  200 mg x 2 doses on 05/08/2023-05/19/2023 --Labs today show no evidence of anemia with Hgb 12.9, MCV 85.2. Iron  panel shows no deficiency with serum iron  61, saturation 21%, ferritin 63. Vitamin B12 and folate levels are normal.  --No need for additional IV iron  at this time.   --RTC in 3 months for lab check and  6 months for labs and follow up.   No orders of the defined types were placed in this encounter.   All questions were answered. The  patient knows to call the clinic with any problems, questions or concerns.  I have spent a total of 25 minutes minutes of face-to-face and non-face-to-face time, preparing to see the patient, performing a medically appropriate examination, counseling and educating the patient, ordering tests, documenting clinical information in the electronic health record, and care coordination.   Wyline Hearing PA-C Dept of Hematology and Oncology [facility] Phone: [phone]

## 2023-07-19 ENCOUNTER — Encounter: Payer: Self-pay | Admitting: Cardiology

## 2023-07-19 ENCOUNTER — Ambulatory Visit: Payer: Medicare Other | Attending: Cardiology | Admitting: Cardiology

## 2023-07-19 VITALS — BP 118/76 | HR 77 | Ht 62.0 in | Wt 255.4 lb

## 2023-07-19 DIAGNOSIS — I25118 Atherosclerotic heart disease of native coronary artery with other forms of angina pectoris: Secondary | ICD-10-CM | POA: Diagnosis not present

## 2023-07-19 DIAGNOSIS — I2699 Other pulmonary embolism without acute cor pulmonale: Secondary | ICD-10-CM

## 2023-07-19 DIAGNOSIS — M7989 Other specified soft tissue disorders: Secondary | ICD-10-CM

## 2023-07-19 DIAGNOSIS — E785 Hyperlipidemia, unspecified: Secondary | ICD-10-CM

## 2023-07-19 DIAGNOSIS — Z7901 Long term (current) use of anticoagulants: Secondary | ICD-10-CM

## 2023-07-19 DIAGNOSIS — I2119 ST elevation (STEMI) myocardial infarction involving other coronary artery of inferior wall: Secondary | ICD-10-CM | POA: Diagnosis not present

## 2023-07-19 DIAGNOSIS — I10 Essential (primary) hypertension: Secondary | ICD-10-CM | POA: Diagnosis not present

## 2023-07-19 DIAGNOSIS — G720 Drug-induced myopathy: Secondary | ICD-10-CM

## 2023-07-19 DIAGNOSIS — E1169 Type 2 diabetes mellitus with other specified complication: Secondary | ICD-10-CM

## 2023-07-19 DIAGNOSIS — T466X5D Adverse effect of antihyperlipidemic and antiarteriosclerotic drugs, subsequent encounter: Secondary | ICD-10-CM

## 2023-07-19 MED ORDER — TIRZEPATIDE 10 MG/0.5ML ~~LOC~~ SOAJ: 10.0000 mg | SUBCUTANEOUS | Status: DC

## 2023-07-19 NOTE — Patient Instructions (Addendum)
 Other Instructions   Talk with your Hematologist after your return - whether you need to continue with Eliquis .  Medication Instructions:  No changes  *If you need a refill on your cardiac medications before your next appointment, please call your pharmacy*   Lab Work:  Lipid- please have your primary send a copy of this results  If you have labs (blood work) drawn today and your tests are completely normal, you will receive your results only by: MyChart Message (if you have MyChart) OR A paper copy in the mail If you have any lab test that is abnormal or we need to change your treatment, we will call you to review the results.   Testing/Procedures: Not needed   Follow-Up: At [facility], you and your health needs are our priority.  As part of our continuing mission to provide you with exceptional heart care, we have created designated Provider Care Teams.  These Care Teams include your primary Cardiologist (physician) and Advanced Practice Providers (APPs -  Physician Assistants and Nurse Practitioners) who all work together to provide you with the care you need, when you need it.     Your next appointment:   12 month(s)  The format for your next appointment:   In Person  Provider:   Randene Bustard, MD   Other Instructions   Talk with your Hematologist after your return - whether you need to continue with Eliquis .

## 2023-07-19 NOTE — Progress Notes (Signed)
 Cardiology Office Note:  .   Date:  07/23/2023  ID:  Sandra Beasley, DOB 03/31/1950, MRN [id number] PCP: Jimmey Mould, MD  [facility] Providers Cardiologist:  Randene Bustard, MD     Chief Complaint  Patient presents with   Follow-up    Roughly 6 months.  Doing better.  Less dyspnea   Coronary Artery Disease    No angina    Patient Profile: Sandra Beasley is a morbidly obese 73 y.o. female  with a PMH reviewed below who presents here for 5-46-month at the request of Jimmey Mould, MD.  Sandra Beasley is a obese (s/p Lap Band) 73 y.o. female  with a PMH CAD-PCI (inferior STEMI), HTN, HLD, DM type II as well as bilateral PE (November 2022) who presents here for 72-month follow-up at the request of Jimmey Mould, MD.  Pertinent PMH : CAD (Inferior STEMI 11/26/ 2014) => 2 overlapping DES to distal RCA-PDA, medical management of OM1 ~ 70% and 80% distal LAD lesion in a diminutive vessel),  MYOVIEW  in 02/2013 & 02/2018 both NON-ISCHEMIC Morbid Obesity s/p lap band, with recent adjustment CRFs: HTN, HLD, DM2 H/o Bilateral PE-November 2022 on maintenance dose Eliquis      Sandra Beasley was last seen by Sandra Redman, PA on March 10, 2023 for follow-up noting exertional dyspnea similar to when she had a PE.  No chest pain or pressure.  She gained weight over 30 pounds which began the onset of her dyspnea.  She had recent started on Mounjaro  but had not had a significant benefit.  She was taking her torsemide  twice a week and denied any PND or orthopnea.  Usually when she takes her torsemide  she has very brisk diuresis..  Subjective  Discussed the use of AI scribe software for clinical note transcription with the patient, who gave verbal consent to proceed.  History of Present Illness  Sandra Beasley is a 73 year old female with coronary artery disease who presents for follow-up regarding her cardiovascular health and medication management.  She  experiences persistent shortness of breath, which is gradually improving. Regular participation in 'silver sneakers' exercise classes has led to gradual improvement in her endurance and a reduction in shortness of breath. She is planning a trip to [place] and views it as a test for her condition.  She is currently on Mounjaro  but has not experienced weight loss, although she has not gained weight either. Her gastric band was loosened, which typically results in weight gain, but this has not occurred, possibly due to Mounjaro .  Her echocardiogram in January showed an ejection fraction of 65-70%. She has a history of coronary artery disease and previously experienced leg pain while on rosuvastatin , which resolved after discontinuation of the medication about a month ago. Her cholesterol levels were checked in March and were satisfactory.  She is on multiple medications for her cardiovascular health, including amlodipine  2.5 mg, candesartan 32 mg, carvedilol  25 mg twice daily, and Eliquis  2.5 mg for a history of pulmonary embolism. She also takes torsemide  20 mg as needed.  For diabetes management, she is on metformin , Mounjaro , and repaglinide. Despite being on Mounjaro , she has not lost weight but has not gained weight either, which may be beneficial given the loosened gastric band.  No current chest pain, tightness, or pressure. She reports improvement in her exercise tolerance and breathing.    Objective  Medications - Mounjaro  2.5 mg injection weekly; metformin   500 mg twice daily; - Repaglinide 0.5 mg 3 times daily with meals. - Amlodipine  2.5 mg; - Candesartan 32 mg; - Carvedilol  25 mg twice a day - Torsemide  as needed (usually taking it twice a week) - Eliquis  2.5 mg twice daily -> maintenance anticoagulation given history of PE  - Pantoprazole  40 mg daily  She has stopped taking her rosuvastatin .  Studies Reviewed: Aaron Aas       Lab Results  Component Value Date   CHOL 151 05/06/2020   HDL  73 05/06/2020   LDLCALC 64 05/06/2020   TRIG 75 05/06/2020   CHOLHDL 2.1 05/06/2020   Lab Results  Component Value Date   HGBA1C 7.0 (H) 11/10/2020   Lab Results  Component Value Date   NA 137 07/12/2023   K 3.9 07/12/2023   CREATININE 1.44 (H) 07/12/2023   GFRNONAA 38 (L) 07/12/2023   GLUCOSE 179 (H) 07/12/2023   ECHO: EF 65 to 70%.  No RWMA.  Mild LVH with GR 1 DD.  Hyperdynamic RV with normal RAP and RAP..  (January 2025)  Risk Assessment/Calculations:         Physical Exam:   VS:  BP 118/76 (BP Location: Right Arm, Patient Position: Sitting)   Pulse 77   Ht 5' 2 (1.575 m)   Wt 255 lb 6.4 oz (115.8 kg)   SpO2 96%   BMI 46.71 kg/m    Wt Readings from Last 3 Encounters:  07/19/23 255 lb 6.4 oz (115.8 kg)  07/12/23 257 lb 11.2 oz (116.9 kg)  05/19/23 254 lb 6.4 oz (115.4 kg)    GEN: Well nourished, well groomed; in no acute distress; morbidly obese. NECK: No JVD; No carotid bruits CARDIAC: Distant but normal S1, S2; RRR, no murmurs, rubs, gallops RESPIRATORY:  Clear to auscultation without rales, wheezing or rhonchi ; nonlabored, good air movement. ABDOMEN: Soft, non-tender, non-distended EXTREMITIES:  No edema; No deformity     ASSESSMENT AND PLAN: .    Problem List Items Addressed This Visit       Cardiology Problems   CAD- s/p DES PCI x2 in RCA; Moderate LCx & LAD Dz (Chronic)   2 stents in the RCA in the setting of inferior STEMI with moderate LCx disease treated medically.  No recurrent angina. - Not on antiplatelet agent as she is on maintenance low-dose Eliquis  for PE prophylaxis - On combination of carvedilol  25 mg twice daily and amlodipine  2.5 mg, along with candesartan 30 mg daily for afterload reduction -> well-controlled.  Continue current meds. - She is not currently on a statin as the Crestor  stopped due to intolerance.  Due for lab recheck.  Will need to consider starting back least twice a week rosuvastatin  with possible lipid clinic referral        Essential hypertension (Chronic)   BP well-controlled on current doses of amlodipine  2.5 mg (dose-reduced from 10 to 2.5 mg), candesartan 32 mg daily and carvedilol  25 mg twice daily. -No change - Hold amlodipine  on days of flying and the day after.      Hyperlipidemia associated with type 2 diabetes mellitus (HCC) (Chronic)   Cholesterol levels normal in March showed LDL of 52 with cholesterol 122 and HDL 52.  Triglycerides 111. Discussed managing cholesterol as a major coronary artery disease risk factor.  Consider newer medications if levels increase following her [place] trip. - Recheck cholesterol levels after the trip. - Consider reintroducing low-dose rosuvastatin  once or twice a week if cholesterol levels increase. - Consider  referral to lipid clinic for alternative cholesterol-lowering options if needed.  Type 2 diabetes mellitus with complications Current regimen with metformin , Mounjaro , and repaglinide maintaining glycemic control.  No weight loss noted with Mounjaro , but no weight gain either. - Continue metformin , Mounjaro , and repaglinide-managed by PCP  .      Relevant Medications   tirzepatide  (MOUNJARO ) 10 MG/0.5ML Pen   PE (pulmonary thromboembolism) (HCC) (Chronic)   Z86.711 - Personal history of pulmonary embolism On long-term anticoagulation with Eliquis . Hematologist managing anticoagulation.  Advised to continue Eliquis  during her upcoming trip to [place] due to thrombosis risk. - Continue Eliquis  2.5 mg during the trip. - Discuss with hematologist about the possibility of switching to baby aspirin  after her upcoming trip to [place]      ST segment elevation myocardial infarction (STEMI) of inferolateral wall, subsequent episode of care (HCC) - Primary (Chronic)   9-1/2 years status post inferior STEMI with PCI to the RCA.  Medical management of small distal OM.  Nonischemic Myoview  with no comment about infarct in 2020. No recurrent angina or CHF.         Other   Current use of long term anticoagulation (Chronic)   On low-dose Eliquis  for history of bilateral PEs.  Will defer to heme-onc.      Statin myopathy (Chronic)   Statin intolerance with leg pains resolved after discontinuation.  In the past he has had similar issues with simvastatin  and atorvastatin . Discussed managing cholesterol and potential reintroduction of low-dose rosuvastatin . - Consider reintroducing low-dose rosuvastatin  once or twice a week if cholesterol levels increase after the trip. - Consider referral to lipid clinic for alternative cholesterol-lowering options if needed.      Swelling of both lower extremities (Chronic)   Lower extremity edema along with dyspnea. Echocardiogram showed normal LVEF 65-70%, good systolic function, no wall motion abnormalities. Improvement in shortness of breath noted, possibly due to increased physical activity. - Continue carvedilol  25 mg twice daily along with candesartan 32 mg daily - Continue current dosing of torsemide  at 20 mg as needed for additional weight gain or dyspnea.. - Continue exercise regimen with Silver Sneakers. - Monitor for increased shortness of breath, especially during the trip. - Consider further evaluation if shortness of breath worsens.          Follow-Up: Return in about 1 year (around 07/18/2024) for [employer].     Signed, Arleen Lacer, MD, MS Randene Bustard, M.D., M.S. Interventional Chartered certified accountant  Pager # [phone]

## 2023-07-23 ENCOUNTER — Encounter: Payer: Self-pay | Admitting: Cardiology

## 2023-07-23 DIAGNOSIS — G720 Drug-induced myopathy: Secondary | ICD-10-CM | POA: Insufficient documentation

## 2023-07-23 NOTE — Assessment & Plan Note (Signed)
 Statin intolerance with leg pains resolved after discontinuation.  In the past he has had similar issues with simvastatin  and atorvastatin . Discussed managing cholesterol and potential reintroduction of low-dose rosuvastatin . - Consider reintroducing low-dose rosuvastatin  once or twice a week if cholesterol levels increase after the trip. - Consider referral to lipid clinic for alternative cholesterol-lowering options if needed.

## 2023-07-23 NOTE — Assessment & Plan Note (Addendum)
 BP well-controlled on current doses of amlodipine  2.5 mg (dose-reduced from 10 to 2.5 mg), candesartan 32 mg daily and carvedilol  25 mg twice daily. -No change - Hold amlodipine  on days of flying and the day after.

## 2023-07-23 NOTE — Assessment & Plan Note (Signed)
 2 stents in the RCA in the setting of inferior STEMI with moderate LCx disease treated medically.  No recurrent angina. - Not on antiplatelet agent as she is on maintenance low-dose Eliquis  for PE prophylaxis - On combination of carvedilol  25 mg twice daily and amlodipine  2.5 mg, along with candesartan 30 mg daily for afterload reduction -> well-controlled.  Continue current meds. - She is not currently on a statin as the Crestor  stopped due to intolerance.  Due for lab recheck.  Will need to consider starting back least twice a week rosuvastatin  with possible lipid clinic referral

## 2023-07-23 NOTE — Assessment & Plan Note (Signed)
 Z86.711 - Personal history of pulmonary embolism On long-term anticoagulation with Eliquis . Hematologist managing anticoagulation.  Advised to continue Eliquis  during her upcoming trip to [place] due to thrombosis risk. - Continue Eliquis  2.5 mg during the trip. - Discuss with hematologist about the possibility of switching to baby aspirin  after her upcoming trip to [place]

## 2023-07-23 NOTE — Assessment & Plan Note (Signed)
 Cholesterol levels normal in March showed LDL of 52 with cholesterol 122 and HDL 52.  Triglycerides 111. Discussed managing cholesterol as a major coronary artery disease risk factor.  Consider newer medications if levels increase following her [place] trip. - Recheck cholesterol levels after the trip. - Consider reintroducing low-dose rosuvastatin  once or twice a week if cholesterol levels increase. - Consider referral to lipid clinic for alternative cholesterol-lowering options if needed.  Type 2 diabetes mellitus with complications Current regimen with metformin , Mounjaro , and repaglinide maintaining glycemic control.  No weight loss noted with Mounjaro , but no weight gain either. - Continue metformin , Mounjaro , and repaglinide-managed by PCP  .

## 2023-07-23 NOTE — Assessment & Plan Note (Signed)
 On low-dose Eliquis  for history of bilateral PEs.  Will defer to heme-onc.

## 2023-07-23 NOTE — Assessment & Plan Note (Signed)
 9-1/2 years status post inferior STEMI with PCI to the RCA.  Medical management of small distal OM.  Nonischemic Myoview  with no comment about infarct in 2020. No recurrent angina or CHF.

## 2023-07-23 NOTE — Assessment & Plan Note (Signed)
 Lower extremity edema along with dyspnea. Echocardiogram showed normal LVEF 65-70%, good systolic function, no wall motion abnormalities. Improvement in shortness of breath noted, possibly due to increased physical activity. - Continue carvedilol  25 mg twice daily along with candesartan 32 mg daily - Continue current dosing of torsemide  at 20 mg as needed for additional weight gain or dyspnea.. - Continue exercise regimen with Silver Sneakers. - Monitor for increased shortness of breath, especially during the trip. - Consider further evaluation if shortness of breath worsens.

## 2023-08-15 ENCOUNTER — Ambulatory Visit (INDEPENDENT_AMBULATORY_CARE_PROVIDER_SITE_OTHER): Admitting: Podiatry

## 2023-08-15 ENCOUNTER — Encounter: Payer: Self-pay | Admitting: Podiatry

## 2023-08-15 DIAGNOSIS — M79674 Pain in right toe(s): Secondary | ICD-10-CM

## 2023-08-15 DIAGNOSIS — B351 Tinea unguium: Secondary | ICD-10-CM

## 2023-08-15 DIAGNOSIS — E119 Type 2 diabetes mellitus without complications: Secondary | ICD-10-CM

## 2023-08-15 DIAGNOSIS — M79675 Pain in left toe(s): Secondary | ICD-10-CM | POA: Diagnosis not present

## 2023-08-15 DIAGNOSIS — L84 Corns and callosities: Secondary | ICD-10-CM | POA: Diagnosis not present

## 2023-08-18 NOTE — Progress Notes (Signed)
  Subjective:  Patient ID: Sandra Beasley, female    DOB: 01-05-1951,  MRN: [id number]  Chief Complaint  Patient presents with   Diabetes    My two big toenails are ingrown.  He usually shaves down the bump on my toe.     73 y.o. female returns for follow-up with the above complaint. History confirmed with patient.  Her toenails have been feeling better with debridement. Calluses thickened again  Objective:  Physical Exam: warm, good capillary refill, no trophic changes or ulcerative lesions, normal DP and PT pulses, normal monofilament exam, normal sensory exam and onychomycosis.  Pinch callus bilateral hallux at the head of the proximal phalanx medially.  Thickening and ingrowing nails with subungual debris of multiple toenails (all 10, hallux are worst in severity) Assessment:   1. Pain due to onychomycosis of toenails of both feet   2. Callus of foot   3. Type 2 diabetes mellitus without complication, without long-term current use of insulin  (HCC)         Plan:  Patient was evaluated and treated and all questions answered.  Discussed the etiology and treatment options for the condition in detail with the patient. . Recommended debridement of the nails today. Sharp and mechanical debridement performed of all painful and mycotic nails today. Nails debrided in length and thickness using a nail nipper to level of comfort. Discussed treatment options including appropriate shoe gear. Follow up as needed for painful nails.    All symptomatic hyperkeratoses were safely debrided with a sterile #15 blade to patient's level of comfort without incident. We discussed preventative and palliative care of these lesions including supportive and accommodative shoegear, padding, prefabricated and custom molded accommodative orthoses, use of a pumice stone and lotions/creams daily.    Return in about 3 months (around 11/15/2023) for at risk diabetic foot care.

## 2023-10-25 ENCOUNTER — Ambulatory Visit: Admitting: Physician Assistant

## 2023-10-25 ENCOUNTER — Inpatient Hospital Stay: Attending: Hematology and Oncology

## 2023-10-25 DIAGNOSIS — Z86718 Personal history of other venous thrombosis and embolism: Secondary | ICD-10-CM | POA: Insufficient documentation

## 2023-10-25 DIAGNOSIS — E538 Deficiency of other specified B group vitamins: Secondary | ICD-10-CM | POA: Diagnosis not present

## 2023-10-25 DIAGNOSIS — Z86711 Personal history of pulmonary embolism: Secondary | ICD-10-CM | POA: Diagnosis not present

## 2023-10-25 DIAGNOSIS — E611 Iron deficiency: Secondary | ICD-10-CM | POA: Insufficient documentation

## 2023-10-25 DIAGNOSIS — D508 Other iron deficiency anemias: Secondary | ICD-10-CM

## 2023-10-25 LAB — CBC WITH DIFFERENTIAL (CANCER CENTER ONLY)
Abs Immature Granulocytes: 0.03 K/uL (ref 0.00–0.07)
Basophils Absolute: 0.1 K/uL (ref 0.0–0.1)
Basophils Relative: 1 %
Eosinophils Absolute: 0.2 K/uL (ref 0.0–0.5)
Eosinophils Relative: 3 %
HCT: 43.1 % (ref 36.0–46.0)
Hemoglobin: 13.8 g/dL (ref 12.0–15.0)
Immature Granulocytes: 1 %
Lymphocytes Relative: 24 %
Lymphs Abs: 1.5 K/uL (ref 0.7–4.0)
MCH: 27.3 pg (ref 26.0–34.0)
MCHC: 32 g/dL (ref 30.0–36.0)
MCV: 85.3 fL (ref 80.0–100.0)
Monocytes Absolute: 0.4 K/uL (ref 0.1–1.0)
Monocytes Relative: 6 %
Neutro Abs: 4.2 K/uL (ref 1.7–7.7)
Neutrophils Relative %: 65 %
Platelet Count: 224 K/uL (ref 150–400)
RBC: 5.05 MIL/uL (ref 3.87–5.11)
RDW: 14.5 % (ref 11.5–15.5)
WBC Count: 6.4 K/uL (ref 4.0–10.5)
nRBC: 0 % (ref 0.0–0.2)

## 2023-10-25 LAB — IRON AND IRON BINDING CAPACITY (CC-WL,HP ONLY)
Iron: 54 ug/dL (ref 28–170)
Saturation Ratios: 17 % (ref 10.4–31.8)
TIBC: 312 ug/dL (ref 250–450)
UIBC: 258 ug/dL (ref 148–442)

## 2023-10-25 LAB — CMP (CANCER CENTER ONLY)
ALT: 15 U/L (ref 0–44)
AST: 10 U/L — ABNORMAL LOW (ref 15–41)
Albumin: 3.8 g/dL (ref 3.5–5.0)
Alkaline Phosphatase: 74 U/L (ref 38–126)
Anion gap: 6 (ref 5–15)
BUN: 24 mg/dL — ABNORMAL HIGH (ref 8–23)
CO2: 26 mmol/L (ref 22–32)
Calcium: 9.2 mg/dL (ref 8.9–10.3)
Chloride: 107 mmol/L (ref 98–111)
Creatinine: 1.54 mg/dL — ABNORMAL HIGH (ref 0.44–1.00)
GFR, Estimated: 35 mL/min — ABNORMAL LOW (ref >60)
Glucose, Bld: 134 mg/dL — ABNORMAL HIGH (ref 70–99)
Potassium: 3.7 mmol/L (ref 3.5–5.1)
Sodium: 139 mmol/L (ref 135–145)
Total Bilirubin: 0.5 mg/dL (ref 0.0–1.2)
Total Protein: 7.3 g/dL (ref 6.5–8.1)

## 2023-10-25 LAB — FERRITIN: Ferritin: 38 ng/mL (ref 11–307)

## 2023-10-25 LAB — FOLATE: Folate: 5.5 ng/mL — ABNORMAL LOW (ref >5.9)

## 2023-10-25 LAB — VITAMIN B12: Vitamin B-12: 838 pg/mL (ref 180–914)

## 2023-10-29 ENCOUNTER — Ambulatory Visit: Payer: Self-pay | Admitting: Physician Assistant

## 2023-10-29 ENCOUNTER — Other Ambulatory Visit: Payer: Self-pay | Admitting: Physician Assistant

## 2023-10-29 MED ORDER — FOLIC ACID 1 MG PO TABS: 1.0000 mg | ORAL_TABLET | Freq: Every day | ORAL | 2 refills | Status: DC

## 2023-10-31 ENCOUNTER — Telehealth: Payer: Self-pay

## 2023-10-31 NOTE — Telephone Encounter (Signed)
 10/25/23 lab results mailed to pt per pt request

## 2023-11-16 ENCOUNTER — Ambulatory Visit: Admitting: Podiatry

## 2023-11-16 DIAGNOSIS — B351 Tinea unguium: Secondary | ICD-10-CM

## 2023-11-16 DIAGNOSIS — M79675 Pain in left toe(s): Secondary | ICD-10-CM | POA: Diagnosis not present

## 2023-11-16 DIAGNOSIS — L84 Corns and callosities: Secondary | ICD-10-CM

## 2023-11-16 DIAGNOSIS — E119 Type 2 diabetes mellitus without complications: Secondary | ICD-10-CM

## 2023-11-16 DIAGNOSIS — M79674 Pain in right toe(s): Secondary | ICD-10-CM | POA: Diagnosis not present

## 2023-11-19 NOTE — Progress Notes (Signed)
  Subjective:  Patient ID: Sandra Beasley, female    DOB: [birth year],  MRN: [id number]  Chief Complaint  Patient presents with   [facility]    Last A1c: close to 7.0 per patient, takes Eliquis .  Does have some callus/build up of hardened skin on medial aspect of left 1st toe.      73 y.o. female returns for follow-up with the above complaint. History confirmed with patient.  Her toenails have been feeling better with debridement. Calluses thickened again.  Her blood sugar is well-controlled.  She reports no other new issues.  Objective:  Physical Exam: warm, good capillary refill, no trophic changes or ulcerative lesions, normal DP and PT pulses, normal monofilament exam, normal sensory exam and onychomycosis.  Pinch callus bilateral hallux at the head of the proximal phalanx medially.  Thickening and ingrowing nails with subungual debris of multiple toenails (all 10, hallux are worst in severity) Assessment:   1. Pain due to onychomycosis of toenails of both feet   2. Callus of foot   3. Type 2 diabetes mellitus without complication, without long-term current use of insulin  (HCC)         Plan:  Patient was evaluated and treated and all questions answered.  Discussed the etiology and treatment options for the condition in detail with the patient. . Recommended debridement of the nails today. Sharp and mechanical debridement performed of all painful and mycotic nails today. Nails debrided in length and thickness using a nail nipper to level of comfort. Discussed treatment options including appropriate shoe gear. Follow up as needed for painful nails.    All symptomatic hyperkeratoses were safely debrided with a sterile #15 blade to patient's level of comfort without incident. We discussed preventative and palliative care of these lesions including supportive and accommodative shoegear, padding, prefabricated and custom molded accommodative orthoses, use of a pumice stone and lotions/creams  daily.    Return in about 3 months (around 02/16/2024) for at risk diabetic foot care.

## 2023-12-13 ENCOUNTER — Inpatient Hospital Stay

## 2023-12-13 ENCOUNTER — Inpatient Hospital Stay: Admitting: Physician Assistant

## 2024-01-08 ENCOUNTER — Encounter: Payer: Self-pay | Admitting: Podiatry

## 2024-01-08 ENCOUNTER — Ambulatory Visit: Admitting: Podiatry

## 2024-01-08 DIAGNOSIS — B351 Tinea unguium: Secondary | ICD-10-CM

## 2024-01-08 DIAGNOSIS — M79675 Pain in left toe(s): Secondary | ICD-10-CM

## 2024-01-08 DIAGNOSIS — M79674 Pain in right toe(s): Secondary | ICD-10-CM | POA: Diagnosis not present

## 2024-01-08 MED ORDER — CEPHALEXIN 500 MG PO CAPS: 500.0000 mg | ORAL_CAPSULE | Freq: Three times a day (TID) | ORAL | 0 refills | Status: AC

## 2024-01-09 ENCOUNTER — Other Ambulatory Visit: Payer: Self-pay | Admitting: Physician Assistant

## 2024-01-09 DIAGNOSIS — E538 Deficiency of other specified B group vitamins: Secondary | ICD-10-CM

## 2024-01-09 DIAGNOSIS — D508 Other iron deficiency anemias: Secondary | ICD-10-CM

## 2024-01-10 ENCOUNTER — Inpatient Hospital Stay: Admitting: Physician Assistant

## 2024-01-10 ENCOUNTER — Inpatient Hospital Stay: Attending: Hematology and Oncology

## 2024-01-10 VITALS — BP 105/74 | HR 66 | Temp 87.5°F | Resp 16 | Ht 62.0 in | Wt 264.9 lb

## 2024-01-10 DIAGNOSIS — Z9884 Bariatric surgery status: Secondary | ICD-10-CM | POA: Diagnosis not present

## 2024-01-10 DIAGNOSIS — E538 Deficiency of other specified B group vitamins: Secondary | ICD-10-CM | POA: Insufficient documentation

## 2024-01-10 DIAGNOSIS — Z86718 Personal history of other venous thrombosis and embolism: Secondary | ICD-10-CM | POA: Diagnosis not present

## 2024-01-10 DIAGNOSIS — Z803 Family history of malignant neoplasm of breast: Secondary | ICD-10-CM | POA: Diagnosis not present

## 2024-01-10 DIAGNOSIS — Z7901 Long term (current) use of anticoagulants: Secondary | ICD-10-CM | POA: Diagnosis not present

## 2024-01-10 DIAGNOSIS — I2699 Other pulmonary embolism without acute cor pulmonale: Secondary | ICD-10-CM

## 2024-01-10 DIAGNOSIS — D509 Iron deficiency anemia, unspecified: Secondary | ICD-10-CM | POA: Diagnosis present

## 2024-01-10 DIAGNOSIS — D508 Other iron deficiency anemias: Secondary | ICD-10-CM

## 2024-01-10 DIAGNOSIS — Z86711 Personal history of pulmonary embolism: Secondary | ICD-10-CM | POA: Diagnosis not present

## 2024-01-10 LAB — CMP (CANCER CENTER ONLY)
ALT: 16 U/L (ref 0–44)
AST: 16 U/L (ref 15–41)
Albumin: 3.8 g/dL (ref 3.5–5.0)
Alkaline Phosphatase: 80 U/L (ref 38–126)
Anion gap: 8 (ref 5–15)
BUN: 21 mg/dL (ref 8–23)
CO2: 25 mmol/L (ref 22–32)
Calcium: 9.2 mg/dL (ref 8.9–10.3)
Chloride: 105 mmol/L (ref 98–111)
Creatinine: 1.42 mg/dL — ABNORMAL HIGH (ref 0.44–1.00)
GFR, Estimated: 38 mL/min — ABNORMAL LOW (ref >60)
Glucose, Bld: 159 mg/dL — ABNORMAL HIGH (ref 70–99)
Potassium: 4.6 mmol/L (ref 3.5–5.1)
Sodium: 138 mmol/L (ref 135–145)
Total Bilirubin: 0.5 mg/dL (ref 0.0–1.2)
Total Protein: 7.3 g/dL (ref 6.5–8.1)

## 2024-01-10 LAB — CBC WITH DIFFERENTIAL (CANCER CENTER ONLY)
Abs Immature Granulocytes: 0.02 K/uL (ref 0.00–0.07)
Basophils Absolute: 0.1 K/uL (ref 0.0–0.1)
Basophils Relative: 1 %
Eosinophils Absolute: 0.2 K/uL (ref 0.0–0.5)
Eosinophils Relative: 4 %
HCT: 42.4 % (ref 36.0–46.0)
Hemoglobin: 13.4 g/dL (ref 12.0–15.0)
Immature Granulocytes: 0 %
Lymphocytes Relative: 24 %
Lymphs Abs: 1.4 K/uL (ref 0.7–4.0)
MCH: 27.3 pg (ref 26.0–34.0)
MCHC: 31.6 g/dL (ref 30.0–36.0)
MCV: 86.5 fL (ref 80.0–100.0)
Monocytes Absolute: 0.4 K/uL (ref 0.1–1.0)
Monocytes Relative: 7 %
Neutro Abs: 3.8 K/uL (ref 1.7–7.7)
Neutrophils Relative %: 64 %
Platelet Count: 192 K/uL (ref 150–400)
RBC: 4.9 MIL/uL (ref 3.87–5.11)
RDW: 14.7 % (ref 11.5–15.5)
WBC Count: 5.9 K/uL (ref 4.0–10.5)
nRBC: 0 % (ref 0.0–0.2)

## 2024-01-10 LAB — IRON AND IRON BINDING CAPACITY (CC-WL,HP ONLY)
Iron: 52 ug/dL (ref 28–170)
Saturation Ratios: 17 % (ref 10.4–31.8)
TIBC: 308 ug/dL (ref 250–450)
UIBC: 256 ug/dL

## 2024-01-10 LAB — FERRITIN: Ferritin: 37 ng/mL (ref 11–307)

## 2024-01-10 LAB — FOLATE: Folate: >20 ng/mL (ref >5.9)

## 2024-01-10 LAB — VITAMIN B12: Vitamin B-12: 936 pg/mL — ABNORMAL HIGH (ref 180–914)

## 2024-01-10 NOTE — Progress Notes (Signed)
  Subjective:  Patient ID: Sandra Beasley, female    DOB: 1951/01/21,  MRN: [id number]  Chief Complaint  Patient presents with   Diabetes    I have an ingrown toenail on my right big toe on both sides.  I'd like him to check the ingrown toenail on the other big toe as well.  Saw Dr. Dale Gull - 1 month ago; A1c - 6.7     73 y.o. female returns for follow-up with the above complaint. History confirmed with patient.  The ingrown's have become quite bothersome again  Objective:  Physical Exam: warm, good capillary refill, no trophic changes or ulcerative lesions, normal DP and PT pulses, normal monofilament exam, normal sensory exam and onychomycosis.  Pinch callus bilateral hallux at the head of the proximal phalanx medially.  Thickening and ingrowing nails with subungual debris of multiple toenails (all 10, hallux are worst in severity) Assessment:   1. Pain due to onychomycosis of toenails of both feet          Plan:  Patient was evaluated and treated and all questions answered.  We discussed further treatment for ingrowing nails.  I debrided both nails slant back fashion this required a local anesthetic we discussed partial border matricectomy which has an upcoming trip and would like to wait until after she will return for another visit in January for the procedure.  Following sterile prep with alcohol the bilateral hallux were anesthetized with 1.5 cc each of 2% lidocaine  and 0.5% Marcaine plain.  The medial and lateral nail folds were then debrided with a sharp nail nipper to a tolerable level to remove the impinging areas.  It was dressed with Neosporin and a bandage.  Follow-up with me as scheduled   No follow-ups on file.

## 2024-01-11 ENCOUNTER — Ambulatory Visit: Payer: Self-pay | Admitting: *Deleted

## 2024-01-11 ENCOUNTER — Encounter: Payer: Self-pay | Admitting: Physician Assistant

## 2024-01-11 NOTE — Progress Notes (Signed)
 [facility] [phone]   [phone]  PROGRESS NOTE  Patient Care Team: Okey Carlin Redbird, MD as PCP - General (Family Medicine) Anner Alm ORN, MD as PCP - Cardiology (Cardiology)  Hematological/Oncological History -11/09/2020-11/13/2020: Admitted due to progressive dyspnea.  CT angiogram of the chest showed extensive bilateral pulmonary emboli with associated right heart strain.  Doppler ultrasound revealed acute DVT involving the left posterior tibial veins, left peroneal veins and right posterior tibial veins.  Patient was started on IV heparin  drip and transition to Eliquis .    -05/17/2021: Establish care with Johnston Police PA-C  -09/22/2021-10/20/2021: Received IV venofer  200 mg once a week x 5 doses  -12/15/2021: Received IV venofer  200 mg x 1 dose  -01/28/2022-02/09/2022: Received IV venofer  200 mg x 2 doses.   -05/08/2023-05/19/2023: Received IV venofer  200 mg x 2 doses  CHIEF COMPLAINTS/PURPOSE OF CONSULTATION:  -H/O bilateral DVT and pulmonary emboli -Iron  deficiency anemia/folate deficiency/vitamin B12 deficiency  HISTORY OF PRESENTING ILLNESS:  Sandra Beasley 73 y.o. female returns for a follow up for iron , B12 and folate deficiency. She is unaccompanied for this visit.  He was last seen on 07/12/2023.  In the interim she denies any changes to her health.   On exam today,Sandra Beasley reports her energy levels are good and she enjoyed her time during her [place] cruise vacation.  She says she did lots of walking and did develop some lower extremity swelling which required resting throughout the trip.  She is trying  weight loss medication as she has been unsuccessful with losing weight.  She has considered tightness in her gastric band as an alternative.  She denies nausea, vomiting or bowel habit changes.  She denies easy bruising or signs of active bleeding. She denies fevers, chills, shortness of breath, sweats, chest pain or cough. She has no  other complaints. Rest of the 10 point ROS is below  MEDICAL HISTORY:  Past Medical History:  Diagnosis Date   Adenomatous polyp of colon 09/2005   Bilateral pulmonary embolism (HCC) 11/09/2020   PE protocol CTA 11/09/2020: Extensive bilateral pulmonary artery thrombus within numerous bilateral upper lobe, right middle lobe and bilateral lower lobe branches. No saddle PE. Mild cardiomegaly with right-sided heart strain.  McKeown signs seen on TTE with severely reduced RV function, IVS flattening in S&D (RV P & Vol Overload) - PAP ~ 57 mmHg. IAS Bowel to L - high RAP. + McConnell Sign   CAD S/P percutaneous coronary angioplasty - DES x2 in RCA 01/02/2013; 02/2013   a. 100% RPDA - Xience Xpedition DES 2.25 mm x 15 mm + 2.25 mm x 8 mm overlapping, mod- severe distal LAD and prox OM1 lesions.;b.  Myoview  02/2013 & Jan 2020: No ischemia or Infarction, Normal EF.   DVT of lower extremity, bilateral (HCC) 11/2020   Presented with bilateral PEs.  Noted to have bilateral popliteal vein DVTs-follow on a trip to and from [place] .   GERD (gastroesophageal reflux disease) 03/06/2013   Hiatal hernia without gangrene and obstruction 11/2020   CTA of the chest showed: Moderate size hiatal hernia with evidence of prior Lap-Band. Also noted cholelithiasis   Hyperlipidemia associated with type 2 diabetes mellitus (HCC)    Hypertension    Obesity    STEMI (ST elevation myocardial infarction) (HCC) 01/02/2013   --> PCI; EF 60 and 65%. Gr 1 DD & No regional WMA, Otherwise relatively normal.   Type 2 diabetes mellitus with circulatory disorder (HCC)    Vitamin  D deficiency     SURGICAL HISTORY: Past Surgical History:  Procedure Laterality Date   ABDOMINAL HYSTERECTOMY     CORONARY ANGIOPLASTY WITH STENT PLACEMENT  01/02/2013   PCI-RCA: Xience Xpedition 2.25 mm x 15 mm, 2.25 mm x 8 mm (2.4 mm)   LAPAROSCOPIC GASTRIC BANDING  2006   LAPAROSCOPIC HYSTERECTOMY  1996   LEFT HEART CATHETERIZATION WITH CORONARY  ANGIOGRAM N/A 01/02/2013   Procedure: LEFT HEART CATHETERIZATION WITH CORONARY ANGIOGRAM;  Surgeon: Alm LELON Clay, MD;  Location: [facility] CATH LAB;: Inferior STEMI: 100% PDA; mid-distal LAD beyond D1 (small caliber) 80%.  Ostial D1 40%.  Lateral OM 1 with 70-80% stenosis.  Large branching ramus intermedius with OM and DIAG branch branches.   NM MYOVIEW  LTD  01/'15; 1/'20   a) EF 60-65%.  Normal wall motion.  No ischemia or infarction.; b) EF 70 to 75%.  No EKG changes.  No evidence of ischemia or infarction.:   TONSILLECTOMY  1966   TRANSTHORACIC ECHOCARDIOGRAM  01/04/2013   (Post inferior STEMI) EF 60 and 65%. Grade 1 diastolic dysfunction with no regional wall motion abnormalities. Otherwise relatively normal.   TRANSTHORACIC ECHOCARDIOGRAM  11/10/2020   (In setting of PE) EF 55 to 60%.  No RWMA.  GR 1 DD.  IVS flattening in systole and diastole consistent with RV pressure volume overload.  Severely reduced RV function with mildly elevated PAP roughly 57 mmHg.  Normal atrial sizes, I-S bowing to the left suggesting high RAP.  McConnell sign also noted suggesting a pulmonary embolus.   TRANSTHORACIC ECHOCARDIOGRAM  02/23/2021   3 months post PE: EF 65 to 70%.  No RWMA.  GR 1 DD with moderate LA dilation.  Unable to assess PAP.  Normal aortic and mitral valves.  Ascending Aorta measured at 37 mmHg. Comparison(s): 11/10/20 EF 55-60%. PA pressure    SOCIAL HISTORY: Social History   Socioeconomic History   Marital status: Divorced    Spouse name: Not on file   Number of children: 3   Years of education: Not on file   Highest education level: Not on file  Occupational History   Occupation: Teacher  Tobacco Use   Smoking status: Never   Smokeless tobacco: Never  Vaping Use   Vaping status: Never Used  Substance and Sexual Activity   Alcohol use: No   Drug use: No   Sexual activity: Not on file  Other Topics Concern   Not on file  Social History Narrative   She is a divorced  nonsmoker who works for [employer]. She is currently now in cardiac rehabilitation. Has changed her dietary habits to a Vegan lifestyle.   Social Drivers of Corporate Investment Banker Strain: Not on file  Food Insecurity: Not on file  Transportation Needs: Not on file  Physical Activity: Not on file  Stress: Not on file  Social Connections: Not on file  Intimate Partner Violence: Not on file    FAMILY HISTORY: Family History  Adopted: Yes  Problem Relation Age of Onset   Breast cancer Maternal Aunt    Diabetes Maternal Aunt    Heart disease Maternal Aunt    Diabetes Maternal Uncle    Diabetes gravidarum Maternal Uncle    Colon cancer Neg Hx    Stomach cancer Neg Hx    Rectal cancer Neg Hx     ALLERGIES:  is allergic to ace inhibitors, actos [pioglitazone], other, ozempic (0.25 or 0.5 mg-dose) [semaglutide(0.25 or 0.5mg -dos)], and statins.  MEDICATIONS:  Current Outpatient Medications  Medication Sig Dispense Refill   amLODipine  (NORVASC ) 2.5 MG tablet Take 2.5 mg by mouth daily. Dose depending on BP     carvedilol  (COREG ) 25 MG tablet Take 25 mg by mouth 2 (two) times daily.     cephALEXin (KEFLEX) 500 MG capsule Take 1 capsule (500 mg total) by mouth 3 (three) times daily. 21 capsule 0   Cholecalciferol (VITAMIN D3) 25 MCG (1000 UT) capsule 1 capsule Orally Once a day     ELIQUIS  2.5 MG TABS tablet Take 2.5 mg by mouth 2 (two) times daily.     folic acid  (FOLVITE ) 1 MG tablet Take 1 tablet (1 mg total) by mouth daily. 30 tablet 2   Lancets (ONETOUCH DELICA PLUS LANCET30G) MISC daily as needed.     metFORMIN  (GLUCOPHAGE ) 500 MG tablet Take 500 mg by mouth 2 (two) times daily with a meal.     metroNIDAZOLE (METROCREAM) 0.75 % cream Apply 1 application topically 2 (two) times daily as needed (rosacea). Apply to face     nitroGLYCERIN  (NITROSTAT ) 0.4 MG SL tablet Place 1 tablet (0.4 mg total) under the tongue every 5 (five) minutes as needed for chest pain. 25 tablet 1    olmesartan (BENICAR) 40 MG tablet Take 40 mg by mouth daily.     ONETOUCH VERIO test strip 1 each by Other route as needed.     pantoprazole  (PROTONIX ) 40 MG tablet Take 1 tablet (40 mg total) by mouth daily. (Patient taking differently: Take 40 mg by mouth every other day.) 30 tablet 1   potassium chloride  (KLOR-CON ) 10 MEQ tablet Take 10 meq once a day as needed with Torsemide . 90 tablet 3   repaglinide (PRANDIN) 0.5 MG tablet Take 0.5 mg by mouth 3 (three) times daily before meals.     torsemide  (DEMADEX ) 20 MG tablet Take 20 mg once a day as needed (Patient taking differently: Take 20 mg by mouth as needed. Take 20 mg once a day as needed) 90 tablet 3   triamcinolone cream (KENALOG) 0.1 % Apply 1 application  topically 2 (two) times daily as needed (rash/irritation).     TRULICITY 3 MG/0.5ML SOAJ SMARTSIG:0.5 Milliliter(s) SUB-Q Once a Week     tirzepatide  (MOUNJARO ) 10 MG/0.5ML Pen Inject 10 mg into the skin once a week. (Patient not taking: Reported on 01/08/2024)     No current facility-administered medications for this visit.    REVIEW OF SYSTEMS:   Constitutional: ( - ) fevers, ( - )  chills , ( - ) night sweats Eyes: ( - ) blurriness of vision, ( - ) double vision, ( - ) watery eyes Ears, nose, mouth, throat, and face: ( - ) mucositis, ( - ) sore throat Respiratory: ( - ) cough, ( - ) dyspnea, ( - ) wheezes Cardiovascular: ( - ) palpitation, ( - ) chest discomfort, (+ ) lower extremity swelling Gastrointestinal:  ( - ) nausea, ( - ) heartburn, ( - ) change in bowel habits Skin: ( - ) abnormal skin rashes Lymphatics: ( - ) new lymphadenopathy, ( - ) easy bruising Neurological: ( - ) numbness, ( - ) tingling, ( - ) new weaknesses Behavioral/Psych: ( - ) mood change, ( - ) new changes  All other systems were reviewed with the patient and are negative.  PHYSICAL EXAMINATION: ECOG PERFORMANCE STATUS: 0 - Asymptomatic  Vitals:   01/10/24 1153  BP: 105/74  Pulse: 66  Resp: 16   Temp: (!) 87.5  F (30.8 C)  SpO2: 95%    Filed Weights   01/10/24 1153  Weight: 264 lb 14.4 oz (120.2 kg)     GENERAL: well appearing female in NAD SKIN: skin color, texture, turgor are normal, no rashes or significant lesions EYES: conjunctiva are pink and non-injected, sclera clear OROPHARYNX: no exudate, no erythema; lips, buccal mucosa, and tongue normal  LUNGS: clear to auscultation and percussion with normal breathing effort HEART: regular rate & rhythm and no murmurs.  Musculoskeletal: no cyanosis of digits and no clubbing  PSYCH: alert & oriented x 3, fluent speech NEURO: no focal motor/sensory deficits  LABORATORY DATA:  I have reviewed the data as listed    Latest Ref Rng & Units 01/10/2024   11:13 AM 10/25/2023   11:24 AM 07/12/2023   11:02 AM  CBC  WBC 4.0 - 10.5 K/uL 5.9  6.4  6.3   Hemoglobin 12.0 - 15.0 g/dL 86.5  86.1  87.0   Hematocrit 36.0 - 46.0 % 42.4  43.1  39.8   Platelets 150 - 400 K/uL 192  224  208        Latest Ref Rng & Units 01/10/2024   11:13 AM 10/25/2023   11:24 AM 07/12/2023   11:02 AM  CMP  Glucose 70 - 99 mg/dL 840  865  820   BUN 8 - 23 mg/dL 21  24  19    Creatinine 0.44 - 1.00 mg/dL 8.57  8.45  8.55   Sodium 135 - 145 mmol/L 138  139  137   Potassium 3.5 - 5.1 mmol/L 4.6  3.7  3.9   Chloride 98 - 111 mmol/L 105  107  108   CO2 22 - 32 mmol/L 25  26  23    Calcium  8.9 - 10.3 mg/dL 9.2  9.2  9.0   Total Protein 6.5 - 8.1 g/dL 7.3  7.3  6.8   Total Bilirubin 0.0 - 1.2 mg/dL 0.5  0.5  0.6   Alkaline Phos 38 - 126 U/L 80  74  69   AST 15 - 41 U/L 16  10  11    ALT 0 - 44 U/L 16  15  15      ASSESSMENT & PLAN Sandra Beasley is a 73 y.o. female returns for a follow up for history of DVT/PE and nutritional deficiencies.    #H/O bilateral lower extremity DVT and bilateral pulmonary emboli with right heart strain: --Diagnosed in October 2022 with CTA that showed extensive bilateral pulmonary emboli with associated right heart strain.  Doppler US  showed acute DVT involving the left posterior tibial veins, left peroneal veins and right posterior tibial veins. --Since there is no definitive provoking factor and due to the extensive thrombotic presentation, the recommendation is indefinite anticoagulation.  --Labs from 05/17/2021 ruled out antiphospholipid syndrome.  --Currently on Eliquis  maintenance dose of 2.5 mg twice daily.   #Vitamin B12/folate/iron  deficiencies: --Likely secondary to gastric banding but has history of erosive gastropathy and duodenal erosions, likely secondary to her lap band in 2019.  --Underwent EGD/colonoscopy in February 2024. Findings that could have caused IDA include gastritis associated with the lap band, ulcerative polyp in the distal transverse colon, internal hemorrhoids.  --Last received IV venofer  200 mg x 2 doses on 05/08/2023-05/19/2023 --Labs today show no evidence of anemia with Hgb 13.4 and MCV 86.5. Iron  panel shows iron  52, saturation 17%, ferritin 37. B12 and folate levels show no deficiency.  --No need for additional IV iron  at this time.  Continue with PO folate supplementation.  --RTC in 3 months for lab check and  6 months for labs and follow up.   No orders of the defined types were placed in this encounter.   All questions were answered. The patient knows to call the clinic with any problems, questions or concerns.  I have spent a total of 25 minutes minutes of face-to-face and non-face-to-face time, preparing to see the patient, performing a medically appropriate examination, counseling and educating the patient, ordering tests, documenting clinical information in the electronic health record, and care coordination.   Johnston Police PA-C Dept of Hematology and Oncology [facility] Phone: [phone]

## 2024-01-11 NOTE — Telephone Encounter (Addendum)
-----   Message from Johnston ONEIDA Police sent at 01/11/2024  3:45 PM EST ----- Please notify patient that folate levels have improved and no evidence of iron  or B12 deficiency. Can continue folic acid  supplementation. No need for IV iron  at this time.  ----- Message ----- From: Rebecka, Lab In [place] Sent: 01/10/2024  11:31 AM EST To: Johnston ONEIDA Police, PA-C  Contacted patient per Ms.Thayil's request to provide information in message above and reached un named VM. Left message for patient to contact this office for test results.

## 2024-01-15 NOTE — Telephone Encounter (Signed)
 Advised pt of lab results and recommendations. Per pt's request, lab results mailed to pt.

## 2024-01-24 ENCOUNTER — Other Ambulatory Visit: Payer: Self-pay | Admitting: Physician Assistant

## 2024-02-15 ENCOUNTER — Ambulatory Visit: Admitting: Podiatry

## 2024-02-15 VITALS — Ht 62.0 in | Wt 264.0 lb

## 2024-02-15 DIAGNOSIS — L84 Corns and callosities: Secondary | ICD-10-CM | POA: Diagnosis not present

## 2024-02-15 DIAGNOSIS — E1159 Type 2 diabetes mellitus with other circulatory complications: Secondary | ICD-10-CM

## 2024-02-15 DIAGNOSIS — L600 Ingrowing nail: Secondary | ICD-10-CM

## 2024-02-15 MED ORDER — NEOMYCIN-POLYMYXIN-HC 3.5-10000-1 OT SUSP: OTIC | 0 refills | Status: AC

## 2024-02-15 NOTE — Patient Instructions (Signed)

## 2024-02-17 ENCOUNTER — Encounter: Payer: Self-pay | Admitting: Podiatry

## 2024-02-17 NOTE — Progress Notes (Signed)
"  °  Subjective:  Patient ID: Sandra Beasley, female    DOB: 1950-12-05,  MRN: [id number]  Chief Complaint  Patient presents with   Nail Problem    RM 5 Patient is here for bilateral callus trim and evaluation of ingrown toenails of the left and right hallux.     74 y.o. female returns for follow-up with the above complaint. History confirmed with patient.  The ingrown's have become quite bothersome again, she would like to have them fixed permanently.  The calluses have thickened again as well and are painful.  Objective:  Physical Exam: warm, good capillary refill, no ulcerative lesions, normal DP and PT pulses, normal monofilament exam, normal sensory exam and onychomycosis.  Pinch callus bilateral hallux at the head of the proximal phalanx medially.  Thickening and ingrowing nails with subungual debris of multiple toenails (all 10, hallux are worst in severity) Assessment:   1. Ingrowing right great toenail   2. Ingrowing left great toenail   3. Callus of foot   4. Type 2 diabetes mellitus with other circulatory complications [facility])          Plan:  Patient was evaluated and treated and all questions answered.  All symptomatic hyperkeratoses were safely debrided with a sterile #15 blade to patient's level of comfort without incident. We discussed preventative and palliative care of these lesions including supportive and accommodative shoegear, padding, prefabricated and custom molded accommodative orthoses, use of a pumice stone and lotions/creams daily.    Ingrown Nail, bilaterally -Patient elects to proceed with minor surgery to remove ingrown toenail today. Consent reviewed and signed by patient. -Ingrown nail excised. See procedure note. -Educated on post-procedure care including soaking. Written instructions provided and reviewed. -Rx for Cortisporin sent to pharmacy. -Advised on signs and symptoms of infection developing.  We discussed that the phenol likely will create  some redness and edema and tenderness around the nailbed as long as it is localized this is to be expected.  Will return as needed if any infection signs develop  Procedure: Excision of Ingrown Toenail Location: Bilateral 1st toe medial and right lateral nail borders. Anesthesia: Lidocaine  1% plain; 1.5 mL and Marcaine 0.5% plain; 1.5 mL, digital block. Skin Prep: Betadine. Dressing: Silvadene; telfa; dry, sterile, compression dressing. Technique: Following skin prep, the toe was exsanguinated and a tourniquet was secured at the base of the toe. The affected nail border was freed, split with a nail splitter, and excised. Chemical matrixectomy was then performed with phenol and irrigated out with alcohol. The tourniquet was then removed and sterile dressing applied. Disposition: Patient tolerated procedure well.     No follow-ups on file.  "

## 2024-04-10 ENCOUNTER — Inpatient Hospital Stay

## 2024-05-16 ENCOUNTER — Ambulatory Visit: Admitting: Podiatry

## 2024-07-10 ENCOUNTER — Inpatient Hospital Stay: Admitting: Physician Assistant

## 2024-07-10 ENCOUNTER — Inpatient Hospital Stay
